# Patient Record
Sex: Male | Born: 1935 | Race: White | Hispanic: No | Marital: Married | Smoking: Current every day smoker
Health system: Southern US, Community
[De-identification: ages and names within clinical notes are randomized; demographics above are authoritative.]

## PROBLEM LIST (undated history)

## (undated) DIAGNOSIS — E785 Hyperlipidemia, unspecified: Secondary | ICD-10-CM

## (undated) DIAGNOSIS — I639 Cerebral infarction, unspecified: Secondary | ICD-10-CM

## (undated) DIAGNOSIS — I2510 Atherosclerotic heart disease of native coronary artery without angina pectoris: Secondary | ICD-10-CM

## (undated) DIAGNOSIS — C801 Malignant (primary) neoplasm, unspecified: Secondary | ICD-10-CM

## (undated) DIAGNOSIS — Z87442 Personal history of urinary calculi: Secondary | ICD-10-CM

## (undated) DIAGNOSIS — J449 Chronic obstructive pulmonary disease, unspecified: Secondary | ICD-10-CM

## (undated) DIAGNOSIS — I10 Essential (primary) hypertension: Secondary | ICD-10-CM

## (undated) DIAGNOSIS — I219 Acute myocardial infarction, unspecified: Secondary | ICD-10-CM

## (undated) DIAGNOSIS — I6521 Occlusion and stenosis of right carotid artery: Secondary | ICD-10-CM

## (undated) HISTORY — DX: Atherosclerotic heart disease of native coronary artery without angina pectoris: I25.10

## (undated) HISTORY — PX: CARDIAC CATHETERIZATION: SHX172

## (undated) HISTORY — DX: Hyperlipidemia, unspecified: E78.5

## (undated) HISTORY — PX: PROSTATE BIOPSY: SHX241

---

## 1999-04-12 ENCOUNTER — Other Ambulatory Visit: Admission: RE | Admit: 1999-04-12 | Discharge: 1999-04-12 | Payer: Self-pay | Admitting: Urology

## 1999-05-07 ENCOUNTER — Inpatient Hospital Stay (HOSPITAL_COMMUNITY): Admission: RE | Admit: 1999-05-07 | Discharge: 1999-05-12 | Payer: Self-pay | Admitting: Urology

## 1999-05-08 ENCOUNTER — Encounter: Payer: Self-pay | Admitting: Urology

## 1999-11-27 ENCOUNTER — Encounter: Payer: Self-pay | Admitting: Urology

## 1999-11-30 ENCOUNTER — Ambulatory Visit (HOSPITAL_COMMUNITY): Admission: RE | Admit: 1999-11-30 | Discharge: 1999-11-30 | Payer: Self-pay | Admitting: Urology

## 2004-07-05 ENCOUNTER — Ambulatory Visit: Payer: Self-pay | Admitting: Family Medicine

## 2004-08-01 ENCOUNTER — Ambulatory Visit: Payer: Self-pay | Admitting: Family Medicine

## 2004-08-21 ENCOUNTER — Ambulatory Visit: Payer: Self-pay | Admitting: Family Medicine

## 2004-12-17 ENCOUNTER — Ambulatory Visit: Payer: Self-pay | Admitting: Family Medicine

## 2005-03-20 ENCOUNTER — Ambulatory Visit: Payer: Self-pay | Admitting: Family Medicine

## 2005-05-27 ENCOUNTER — Ambulatory Visit: Payer: Self-pay | Admitting: Family Medicine

## 2005-09-10 ENCOUNTER — Ambulatory Visit: Payer: Self-pay | Admitting: Family Medicine

## 2005-09-24 ENCOUNTER — Ambulatory Visit: Payer: Self-pay | Admitting: Family Medicine

## 2005-10-28 ENCOUNTER — Ambulatory Visit: Payer: Self-pay | Admitting: Family Medicine

## 2006-03-13 ENCOUNTER — Ambulatory Visit: Payer: Self-pay | Admitting: Family Medicine

## 2006-05-21 ENCOUNTER — Ambulatory Visit: Payer: Self-pay | Admitting: Family Medicine

## 2006-06-06 ENCOUNTER — Ambulatory Visit: Payer: Self-pay | Admitting: Family Medicine

## 2006-09-18 ENCOUNTER — Ambulatory Visit: Payer: Self-pay | Admitting: Family Medicine

## 2006-11-24 ENCOUNTER — Ambulatory Visit: Payer: Self-pay | Admitting: Family Medicine

## 2007-01-19 ENCOUNTER — Ambulatory Visit: Payer: Self-pay | Admitting: Family Medicine

## 2007-01-29 ENCOUNTER — Ambulatory Visit (HOSPITAL_COMMUNITY): Admission: RE | Admit: 2007-01-29 | Discharge: 2007-01-29 | Payer: Self-pay | Admitting: Urology

## 2007-02-17 ENCOUNTER — Ambulatory Visit (HOSPITAL_COMMUNITY): Admission: RE | Admit: 2007-02-17 | Discharge: 2007-02-17 | Payer: Self-pay | Admitting: Internal Medicine

## 2007-02-25 ENCOUNTER — Ambulatory Visit (HOSPITAL_BASED_OUTPATIENT_CLINIC_OR_DEPARTMENT_OTHER): Admission: RE | Admit: 2007-02-25 | Discharge: 2007-02-25 | Payer: Self-pay | Admitting: Urology

## 2010-10-24 ENCOUNTER — Other Ambulatory Visit: Payer: Self-pay | Admitting: Ophthalmology

## 2010-10-24 ENCOUNTER — Encounter (HOSPITAL_COMMUNITY): Payer: Medicare Other | Attending: Ophthalmology

## 2010-10-24 DIAGNOSIS — Z01812 Encounter for preprocedural laboratory examination: Secondary | ICD-10-CM | POA: Insufficient documentation

## 2010-10-24 DIAGNOSIS — Z01810 Encounter for preprocedural cardiovascular examination: Secondary | ICD-10-CM | POA: Insufficient documentation

## 2010-10-24 LAB — BASIC METABOLIC PANEL
BUN: 18 mg/dL (ref 6–23)
Creatinine, Ser: 0.82 mg/dL (ref 0.4–1.5)
GFR calc Af Amer: >60 mL/min (ref >60)
GFR calc non Af Amer: >60 mL/min (ref >60)
Potassium: 4.3 mEq/L (ref 3.5–5.1)

## 2010-10-24 LAB — HEMOGLOBIN AND HEMATOCRIT, BLOOD
HCT: 47.7 % (ref 39.0–52.0)
Hemoglobin: 16.7 g/dL (ref 13.0–17.0)

## 2010-10-30 ENCOUNTER — Ambulatory Visit (HOSPITAL_COMMUNITY)
Admission: RE | Admit: 2010-10-30 | Discharge: 2010-10-30 | Disposition: A | Discharge status: Home / Self Care | Payer: Medicare Other | Source: Ambulatory Visit | Attending: Ophthalmology | Admitting: Ophthalmology

## 2010-10-30 DIAGNOSIS — Z7982 Long term (current) use of aspirin: Secondary | ICD-10-CM | POA: Insufficient documentation

## 2010-10-30 DIAGNOSIS — H2510 Age-related nuclear cataract, unspecified eye: Secondary | ICD-10-CM | POA: Insufficient documentation

## 2010-11-08 ENCOUNTER — Other Ambulatory Visit (HOSPITAL_COMMUNITY): Payer: Medicare Other

## 2010-11-13 ENCOUNTER — Ambulatory Visit (HOSPITAL_COMMUNITY)
Admission: RE | Admit: 2010-11-13 | Discharge: 2010-11-13 | Disposition: A | Discharge status: Home / Self Care | Payer: Medicare Other | Source: Ambulatory Visit | Attending: Ophthalmology | Admitting: Ophthalmology

## 2010-11-13 DIAGNOSIS — H2510 Age-related nuclear cataract, unspecified eye: Secondary | ICD-10-CM | POA: Insufficient documentation

## 2010-11-13 DIAGNOSIS — Z7982 Long term (current) use of aspirin: Secondary | ICD-10-CM | POA: Insufficient documentation

## 2010-12-18 NOTE — Op Note (Signed)
Daniel Simon, Daniel Simon              ACCOUNT NO.:  [id number]   MEDICAL RECORD NO.:  [id number]          PATIENT TYPE:  AMB   LOCATION:  [facility]                         FACILITY:  [facility]   PHYSICIAN:  Mark C. Vernie Ammons, M.D.  DATE OF BIRTH:  March 23, 1935   DATE OF PROCEDURE:  02/25/2007  DATE OF DISCHARGE:                               OPERATIVE REPORT   PREOPERATIVE DIAGNOSIS:  Left ureteral calculi.   POSTOPERATIVE DIAGNOSIS:  Left ureteral calculi.   OPERATION PERFORMED:  Cystoscopy, left retrograde pyelography, left  ureteroscopic stone manipulation and basket extraction, left ureteral  stent placement.   SURGEON:  Mark C. Vernie Ammons, M.D.   ASSISTANT:  Melina Schools, M.D.   ANESTHESIA:  General.   SPECIMENS:  Ureteral calculi.   DRAINS:  4.7 x 27 double-J ureteral stent with tether.   INDICATIONS FOR PROCEDURE:  Mr. Kunert is a 76 year old male well-healed  presented to referring institution with flank pain.  He was found to  have ureteral calculus.  Attempts at ureteroscopic manipulation were  unsuccessful.  The patient ended up with percutaneous nephrostomy and  antegrade stent.  He presents for definitive stone management.   DESCRIPTION OF PROCEDURE:  The patient was brought to the operating  room.  After informed consent was verified and preoperative time out  performed.  Had successful induction of general endotracheal anesthesia.  The patient was moved to dorsal lithotomy position.  All appropriate  pressure points were padded to avoid any pressure compartment syndrome.  Sequential compression devices were employed.  Perioperative antibiotics  were administered.  The perineum was prepped and draped.  Surgeon was  Designer, jewellery.  22 French cystoscopic sheath was used to introduce a  12 degree cystoscopic lens transurethrally into the bladder.  Transurethroscopy was performed.  There was a bladder neck contracture  that permitted passage of the 22 French sheath.  Dilated  ureteral  orifices were noted.  The left orifice was laterally located.  Distal  coil of double-J stent was seen to exit.  The remainder of the bladder  was free of any mucosal lesions, erythema, foreign bodies and tumors  Attention was turned to the stent.  It was grasped with flexible  grasping forceps and externalized.  The wire was advanced into the renal  pelvis.  The stent was removed.  Cystoscope was removed.  A 6 short  semirigid ureteroscope was driven into the ureter under direct vision.  Then in the distal ureter we identified two approximately 6 mm stones.  Nitinol basket was used to extract them and deposit them in the bladder.  The ureteroscope was removed.  The cystoscope was backloaded with the  wire.  Attempts at passing the stent were unsuccessful.  We removed the  cystoscope.  A 6 French end hole catheter was advanced into the  midureter.  Contrast was injected and a left retrograde pyelogram was  performed.  Left retrograde pyelogram revealed a tortuous left ureter.  There was mild hydronephrosis.   A Glide wire was then advanced into the renal pelvis under fluoroscopic  control.  A 4.7 x 24 double-J ureteral stent  was advanced over the wire  under fluoroscopic control.  While controlling the tether once the  proximal end was seen within the renal pelvis, the wire was removed.  Excellent proximal and distal curls were noted.  The cystoscope was  reinserted.  The bladder were drained and the stones were externalized.  At this time the procedure was terminated.  The patient tolerated the  procedure well.  There were no complications.   Ihor Gully MD the attending primary and responsible physician present  and participated in all aspects of the procedure.   DISPOSITION:  Patient extubated uneventfully and transported safely to  PACU.     ______________________________  Cristal Generous. Vernie Ammons, M.D.  Electronically Signed    JR/MEDQ  D:   02/25/2007  T:  02/26/2007  Job:  [id number]

## 2010-12-21 NOTE — Op Note (Signed)
[place]  Patient:    Daniel Simon, Daniel Simon                       MRN: [id number] Proc. Date: 11/30/99 Adm. Date:  [id number] Disc. Date: [id number] Attending:  Trisha Mangle                           Operative Report  PREOPERATIVE DIAGNOSIS:  Bladder calculus.  POSTOPERATIVE DIAGNOSIS:  Bladder calculus.  PROCEDURE:  Cystolithalopaxy.  SURGEON:  Mark C. Vernie Ammons, M.D.  ANESTHESIA:  General.  DRAINS:  None.  SPECIMENS:  Stone to pathology.  BLOOD LOSS:  Less than 5 cc.  COMPLICATIONS:  None.  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  Since that time, he has been plagued by persistent pyuria and recurrent UTIs.  He also has fairly significant irritative voiding symptoms and urinary incontinence.  He was found in my office cystoscopically to have a large bladder stone.  He is brought to the OR for its  removal.  The risks, complications, and limitations have been discussed.  DESCRIPTION OF PROCEDURE:  After informed consent, the patient was brought to the major OR, and after receiving 80 mg of tobramycin IV, and 400 mg of Cipro IV, he was administered general anesthesia, and then moved to the dorsal lithotomy position.  His genitalia was sterilely prepped and draped.  The urethra was then sounded with Sissy Hoff sounds which easily passed up to 28-French without difficulty into the bladder.  The 23-French cystoscope was then inserted into the bladder and the obturator removed and the 12 degree lens inserted.  The bladder was fully inspected and noted to have some irritation on the floor and a large yellow stone which was photographed.  There was a little bit of fibrinous exudate at the bladder neck hat was scraped off as well and then the cystoscope was removed.  I then inserted the a lithotrite and found that the stone was very soft.  I  crushed it up easily and extracted all the bits with the lithotrites and using the Ellik evacuator.  I then reinspected the bladder and noted the mucosa to be intact with no significant injury or bleeding, and then drained the bladder and removed the scope.  The patient was awakened and taken to the recovery room in stable satisfactory condition.  He tolerated the procedure well with no intraoperative complications. He will be continued on antibiotics postoperatively and follow up in my office n two weeks for recheck. DD:  11/30/99 TD:  12/01/99 Job: [id number]

## 2011-05-20 LAB — CBC
Platelets: 203
RBC: 4.72
WBC: 7.5

## 2011-05-20 LAB — POCT I-STAT 4, (NA,K, GLUC, HGB,HCT)
Glucose, Bld: 178 — ABNORMAL HIGH
HCT: 48
Hemoglobin: 16.3
Operator id: 114531
Potassium: 4.1
Sodium: 138

## 2015-03-14 ENCOUNTER — Other Ambulatory Visit (HOSPITAL_COMMUNITY): Payer: Self-pay | Admitting: General Surgery

## 2015-03-14 DIAGNOSIS — M79605 Pain in left leg: Principal | ICD-10-CM

## 2015-03-14 DIAGNOSIS — M79604 Pain in right leg: Secondary | ICD-10-CM

## 2015-03-14 DIAGNOSIS — Z8739 Personal history of other diseases of the musculoskeletal system and connective tissue: Secondary | ICD-10-CM

## 2015-03-23 ENCOUNTER — Ambulatory Visit (HOSPITAL_COMMUNITY)
Admission: RE | Admit: 2015-03-23 | Discharge: 2015-03-23 | Disposition: A | Discharge status: Home / Self Care | Payer: Medicare Other | Source: Ambulatory Visit | Attending: General Surgery | Admitting: General Surgery

## 2015-03-23 ENCOUNTER — Other Ambulatory Visit (HOSPITAL_COMMUNITY): Payer: Self-pay | Admitting: General Surgery

## 2015-03-23 DIAGNOSIS — M5442 Lumbago with sciatica, left side: Secondary | ICD-10-CM

## 2015-03-23 DIAGNOSIS — Z8739 Personal history of other diseases of the musculoskeletal system and connective tissue: Secondary | ICD-10-CM

## 2015-03-23 DIAGNOSIS — M79605 Pain in left leg: Principal | ICD-10-CM

## 2015-03-23 DIAGNOSIS — M79604 Pain in right leg: Secondary | ICD-10-CM

## 2015-03-23 DIAGNOSIS — M8588 Other specified disorders of bone density and structure, other site: Secondary | ICD-10-CM | POA: Insufficient documentation

## 2015-03-23 DIAGNOSIS — M545 Low back pain: Secondary | ICD-10-CM | POA: Insufficient documentation

## 2017-12-24 ENCOUNTER — Encounter (HOSPITAL_COMMUNITY): Payer: Self-pay | Admitting: Emergency Medicine

## 2017-12-24 ENCOUNTER — Other Ambulatory Visit: Payer: Self-pay

## 2017-12-24 ENCOUNTER — Emergency Department (HOSPITAL_COMMUNITY): Payer: Medicare Other

## 2017-12-24 ENCOUNTER — Inpatient Hospital Stay (HOSPITAL_COMMUNITY)
Admission: EM | Admit: 2017-12-24 | Discharge: 2017-12-26 | DRG: 064 | Disposition: A | Discharge status: Home / Self Care | Payer: Medicare Other | Attending: Internal Medicine | Admitting: Internal Medicine

## 2017-12-24 DIAGNOSIS — R200 Anesthesia of skin: Secondary | ICD-10-CM | POA: Diagnosis not present

## 2017-12-24 DIAGNOSIS — R29702 NIHSS score 2: Secondary | ICD-10-CM | POA: Diagnosis present

## 2017-12-24 DIAGNOSIS — Z87442 Personal history of urinary calculi: Secondary | ICD-10-CM

## 2017-12-24 DIAGNOSIS — I712 Thoracic aortic aneurysm, without rupture: Secondary | ICD-10-CM | POA: Diagnosis present

## 2017-12-24 DIAGNOSIS — I10 Essential (primary) hypertension: Secondary | ICD-10-CM | POA: Diagnosis not present

## 2017-12-24 DIAGNOSIS — I639 Cerebral infarction, unspecified: Principal | ICD-10-CM | POA: Diagnosis present

## 2017-12-24 DIAGNOSIS — Z720 Tobacco use: Secondary | ICD-10-CM | POA: Diagnosis present

## 2017-12-24 DIAGNOSIS — I7121 Aneurysm of the ascending aorta, without rupture: Secondary | ICD-10-CM | POA: Diagnosis present

## 2017-12-24 DIAGNOSIS — I110 Hypertensive heart disease with heart failure: Secondary | ICD-10-CM | POA: Diagnosis present

## 2017-12-24 DIAGNOSIS — J439 Emphysema, unspecified: Secondary | ICD-10-CM | POA: Diagnosis present

## 2017-12-24 DIAGNOSIS — I6523 Occlusion and stenosis of bilateral carotid arteries: Secondary | ICD-10-CM | POA: Diagnosis present

## 2017-12-24 DIAGNOSIS — I509 Heart failure, unspecified: Secondary | ICD-10-CM

## 2017-12-24 DIAGNOSIS — Z7982 Long term (current) use of aspirin: Secondary | ICD-10-CM

## 2017-12-24 DIAGNOSIS — I5021 Acute systolic (congestive) heart failure: Secondary | ICD-10-CM | POA: Diagnosis present

## 2017-12-24 DIAGNOSIS — Z8546 Personal history of malignant neoplasm of prostate: Secondary | ICD-10-CM

## 2017-12-24 DIAGNOSIS — F17210 Nicotine dependence, cigarettes, uncomplicated: Secondary | ICD-10-CM | POA: Diagnosis present

## 2017-12-24 DIAGNOSIS — R591 Generalized enlarged lymph nodes: Secondary | ICD-10-CM | POA: Diagnosis present

## 2017-12-24 HISTORY — DX: Malignant (primary) neoplasm, unspecified: C80.1

## 2017-12-24 LAB — DIFFERENTIAL
BASOS ABS: 0 10*3/uL (ref 0.0–0.1)
BASOS PCT: 0 %
EOS ABS: 0.2 10*3/uL (ref 0.0–0.7)
Eosinophils Relative: 3 %
Lymphocytes Relative: 29 %
Lymphs Abs: 1.7 10*3/uL (ref 0.7–4.0)
Monocytes Absolute: 0.3 10*3/uL (ref 0.1–1.0)
Monocytes Relative: 5 %
NEUTROS ABS: 3.8 10*3/uL (ref 1.7–7.7)
NEUTROS PCT: 63 %

## 2017-12-24 LAB — RAPID URINE DRUG SCREEN, HOSP PERFORMED
AMPHETAMINES: NOT DETECTED
BARBITURATES: NOT DETECTED
Benzodiazepines: NOT DETECTED
Cocaine: NOT DETECTED
Opiates: NOT DETECTED
TETRAHYDROCANNABINOL: NOT DETECTED

## 2017-12-24 LAB — URINALYSIS, ROUTINE W REFLEX MICROSCOPIC
BILIRUBIN URINE: NEGATIVE
GLUCOSE, UA: NEGATIVE mg/dL
Ketones, ur: NEGATIVE mg/dL
LEUKOCYTES UA: NEGATIVE
NITRITE: NEGATIVE
PH: 7 (ref 5.0–8.0)
Protein, ur: NEGATIVE mg/dL
SPECIFIC GRAVITY, URINE: 1.006 (ref 1.005–1.030)

## 2017-12-24 LAB — COMPREHENSIVE METABOLIC PANEL
ALBUMIN: 4.2 g/dL (ref 3.5–5.0)
ALT: 10 U/L — AB (ref 17–63)
AST: 16 U/L (ref 15–41)
Alkaline Phosphatase: 86 U/L (ref 38–126)
Anion gap: 9 (ref 5–15)
BUN: 16 mg/dL (ref 6–20)
CHLORIDE: 108 mmol/L (ref 101–111)
CO2: 25 mmol/L (ref 22–32)
CREATININE: 0.94 mg/dL (ref 0.61–1.24)
Calcium: 9.3 mg/dL (ref 8.9–10.3)
GFR calc Af Amer: >60 mL/min (ref >60)
GFR calc non Af Amer: >60 mL/min (ref >60)
Glucose, Bld: 93 mg/dL (ref 65–99)
POTASSIUM: 3.9 mmol/L (ref 3.5–5.1)
SODIUM: 142 mmol/L (ref 135–145)
Total Bilirubin: 0.9 mg/dL (ref 0.3–1.2)
Total Protein: 7.5 g/dL (ref 6.5–8.1)

## 2017-12-24 LAB — CBC
HCT: 49.3 % (ref 39.0–52.0)
Hemoglobin: 16.8 g/dL (ref 13.0–17.0)
MCH: 30.5 pg (ref 26.0–34.0)
MCHC: 34.1 g/dL (ref 30.0–36.0)
MCV: 89.6 fL (ref 78.0–100.0)
PLATELETS: 104 10*3/uL — AB (ref 150–400)
RBC: 5.5 MIL/uL (ref 4.22–5.81)
RDW: 14.6 % (ref 11.5–15.5)
WBC: 6.1 10*3/uL (ref 4.0–10.5)

## 2017-12-24 LAB — ETHANOL

## 2017-12-24 LAB — BRAIN NATRIURETIC PEPTIDE: B NATRIURETIC PEPTIDE 5: 509 pg/mL — AB (ref 0.0–100.0)

## 2017-12-24 LAB — APTT: APTT: 28 s (ref 24–36)

## 2017-12-24 LAB — TROPONIN I

## 2017-12-24 LAB — PROTIME-INR
INR: 1.06
PROTHROMBIN TIME: 13.8 s (ref 11.4–15.2)

## 2017-12-24 MED ORDER — NICOTINE 21 MG/24HR TD PT24
21.0000 mg | MEDICATED_PATCH | Freq: Every day | TRANSDERMAL | Status: DC
  Administered 2017-12-24 – 2017-12-26 (×3): 21 mg via TRANSDERMAL
  Filled 2017-12-24 (×3): qty 1

## 2017-12-24 MED ORDER — ACETAMINOPHEN 160 MG/5ML PO SOLN: 650.0000 mg | ORAL | Status: DC | PRN

## 2017-12-24 MED ORDER — SENNOSIDES-DOCUSATE SODIUM 8.6-50 MG PO TABS: 1.0000 | ORAL_TABLET | Freq: Every evening | ORAL | Status: DC | PRN

## 2017-12-24 MED ORDER — ASPIRIN 300 MG RE SUPP: 300.0000 mg | Freq: Every day | RECTAL | Status: DC

## 2017-12-24 MED ORDER — ACETAMINOPHEN 325 MG PO TABS: 650.0000 mg | ORAL_TABLET | ORAL | Status: DC | PRN

## 2017-12-24 MED ORDER — SODIUM CHLORIDE 0.9 % IV SOLN: INTRAVENOUS | Status: DC

## 2017-12-24 MED ORDER — FUROSEMIDE 10 MG/ML IJ SOLN
20.0000 mg | Freq: Two times a day (BID) | INTRAMUSCULAR | Status: DC
  Administered 2017-12-24 – 2017-12-26 (×4): 20 mg via INTRAVENOUS
  Filled 2017-12-24 (×4): qty 2

## 2017-12-24 MED ORDER — ENOXAPARIN SODIUM 40 MG/0.4ML ~~LOC~~ SOLN
40.0000 mg | SUBCUTANEOUS | Status: DC
  Administered 2017-12-24 – 2017-12-25 (×2): 40 mg via SUBCUTANEOUS
  Filled 2017-12-24 (×2): qty 0.4

## 2017-12-24 MED ORDER — ACETAMINOPHEN 650 MG RE SUPP: 650.0000 mg | RECTAL | Status: DC | PRN

## 2017-12-24 MED ORDER — IPRATROPIUM-ALBUTEROL 0.5-2.5 (3) MG/3ML IN SOLN
3.0000 mL | Freq: Three times a day (TID) | RESPIRATORY_TRACT | Status: DC
  Administered 2017-12-25 (×2): 3 mL via RESPIRATORY_TRACT
  Filled 2017-12-24 (×2): qty 3

## 2017-12-24 MED ORDER — IPRATROPIUM-ALBUTEROL 0.5-2.5 (3) MG/3ML IN SOLN
3.0000 mL | Freq: Four times a day (QID) | RESPIRATORY_TRACT | Status: DC
  Administered 2017-12-24: 3 mL via RESPIRATORY_TRACT

## 2017-12-24 MED ORDER — GUAIFENESIN ER 600 MG PO TB12
1200.0000 mg | ORAL_TABLET | Freq: Two times a day (BID) | ORAL | Status: DC
  Administered 2017-12-24 – 2017-12-26 (×4): 1200 mg via ORAL
  Filled 2017-12-24 (×4): qty 2

## 2017-12-24 MED ORDER — ASPIRIN 325 MG PO TABS
325.0000 mg | ORAL_TABLET | Freq: Every day | ORAL | Status: DC
  Administered 2017-12-24 – 2017-12-26 (×3): 325 mg via ORAL
  Filled 2017-12-24 (×3): qty 1

## 2017-12-24 MED ORDER — STROKE: EARLY STAGES OF RECOVERY BOOK
Freq: Once | Status: AC
  Administered 2017-12-24: 22:00:00
  Filled 2017-12-24: qty 1

## 2017-12-24 NOTE — ED Triage Notes (Signed)
Pt states started with severe headache last night around 9pm. Pt states left arm and leg numbness which began last night and when he woke up this am still the same.

## 2017-12-24 NOTE — ED Notes (Signed)
Patient transported to CT 

## 2017-12-24 NOTE — ED Notes (Signed)
Advised patient we needed urine specimen. 

## 2017-12-24 NOTE — ED Provider Notes (Signed)
[facility] EMERGENCY DEPARTMENT Provider Note   CSN: [id number] Arrival date & time: 12/24/17  5277     History   Chief Complaint Chief Complaint  Patient presents with  . Numbness    HPI Daniel Simon is a 83 y.o. male.  HPI Pt was seen at 1055. Per pt and his wife, c/o gradual onset and persistence of constant left sided "numbness" and "weakness" that began last night approximately 2000/2100. Pt's wife states pt c/o dull right sided headache that gradually worsened "until he took something and it got better." This was followed by LUE and LLE weakness and numbness. Pt's wife states she feels pt's speech is also slurred, as well as right face is "doesn't look right." Denies headache currently. Denies visual changes, no dysphagia, no CP/palpitations, no SOB/cough, no abd pain, no N/V/D, no falls, no syncope/near syncope.     Past Medical History:  Diagnosis Date  . Cancer [facility])    prostate  . Renal disorder    kidney stones    There are no active problems to display for this patient.         Home Medications    Prior to Admission medications   Medication Sig Start Date End Date Taking? Authorizing Provider  aspirin EC 81 MG tablet Take 81 mg by mouth daily.   Yes [provider]    Family History No family history on file.  Social History Social History   Tobacco Use  . Smoking status: Current Every Day Smoker    Packs/day: 1.00    Types: Cigarettes  . Smokeless tobacco: Never Used  Substance Use Topics  . Alcohol use: Never    Frequency: Never  . Drug use: Never     Allergies   Patient has no allergy information on record.   Review of Systems Review of Systems ROS: Statement: All systems negative except as marked or noted in the HPI; Constitutional: Negative for fever and chills. ; ; Eyes: Negative for eye pain, redness and discharge. ; ; ENMT: Negative for ear pain, hoarseness, nasal congestion, sinus pressure and sore throat. ; ;  Cardiovascular: Negative for chest pain, palpitations, diaphoresis, dyspnea and peripheral edema. ; ; Respiratory: Negative for cough, wheezing and stridor. ; ; Gastrointestinal: Negative for nausea, vomiting, diarrhea, abdominal pain, blood in stool, hematemesis, jaundice and rectal bleeding. . ; ; Genitourinary: Negative for dysuria, flank pain and hematuria. ; ; Musculoskeletal: Negative for back pain and neck pain. Negative for swelling and trauma.; ; Skin: Negative for pruritus, rash, abrasions, blisters, bruising and skin lesion.; ; Neuro: +headache, extremity weakness, paresthesias. Negative for headache, lightheadedness and neck stiffness. Negative for weakness, altered level of consciousness, altered mental status, involuntary movement, seizure and syncope.       Physical Exam Updated Vital Signs BP (!) 156/94 (BP Location: Right Arm)   Pulse 89   Temp 97.8 F (36.6 C) (Oral)   Resp (!) 22   Ht 5\' 7"  (1.702 m)   Wt 59 kg (130 lb)   SpO2 93%   BMI 20.36 kg/m   Physical Exam 1100: Physical examination:  Nursing notes reviewed; Vital signs and O2 SAT reviewed;  Constitutional: Well developed, Well nourished, Well hydrated, In no acute distress; Head:  Normocephalic, atraumatic; Eyes: EOMI, PERRL, No scleral icterus; ENMT: Mouth and pharynx normal, Mucous membranes moist; Neck: Supple, Full range of motion, No lymphadenopathy; Cardiovascular: Regular rate and rhythm, No gallop; Respiratory: Breath sounds clear & equal bilaterally, No wheezes.  Speaking  full sentences with ease, Normal respiratory effort/excursion; Chest: Nontender, Movement normal; Abdomen: Soft, Nontender, Nondistended, Normal bowel sounds; Genitourinary: No CVA tenderness; Extremities: Peripheral pulses normal, No tenderness, No edema, No calf edema or asymmetry.; Neuro: AA&Ox3, Major CN grossly intact. Speech slurred.  +right facial droop. Grips R>L. Strength 5/5 RUE and RLE; strength 4/5 LUE and LLE.  +subjective  decreased sensation LUE and LLE, otherwise no gross sensory deficits.  Normal cerebellar testing bilat UE's (finger-nose) and LE's (heel-shin).; Skin: Color normal, Warm, Dry.   ED Treatments / Results  Labs (all labs ordered are listed, but only abnormal results are displayed)   EKG EKG Interpretation  Date/Time:  Wednesday Dec 24 2017 09:49:43 EDT Ventricular Rate:  99 PR Interval:  156 QRS Duration: 96 QT Interval:  360 QTC Calculation: 462 R Axis:   -19 Text Interpretation:  Sinus rhythm with marked sinus arrhythmia with occasional Premature ventricular complexes Anterior infarct , age undetermined Nonspecific T wave abnormality Lateral leads When compared with ECG of 10/24/2010 Premature ventricular complexes is now Present Nonspecific T wave abnormality is now Present Confirmed by Francine Graven [phone]) on 12/24/2017 12:05:09 PM   Radiology   Procedures Procedures (including critical care time)  Medications Ordered in ED Medications - No data to display   Initial Impression / Assessment and Plan / ED Course  I have reviewed the triage vital signs and the nursing notes.  Pertinent labs & imaging results that were available during my care of the patient were reviewed by me and considered in my medical decision making (see chart for details).  MDM Reviewed: previous chart, nursing note and vitals Reviewed previous: labs and ECG Interpretation: labs, ECG, CT scan and MRI    Results for orders placed or performed during the hospital encounter of 12/24/17  Ethanol  Result Value Ref Range   Alcohol, Ethyl (B) <10 <10 mg/dL  Protime-INR  Result Value Ref Range   Prothrombin Time 13.8 11.4 - 15.2 seconds   INR 1.06   APTT  Result Value Ref Range   aPTT 28 24 - 36 seconds  CBC  Result Value Ref Range   WBC 6.1 4.0 - 10.5 K/uL   RBC 5.50 4.22 - 5.81 MIL/uL   Hemoglobin 16.8 13.0 - 17.0 g/dL   HCT 49.3 39.0 - 52.0 %   MCV 89.6 78.0 - 100.0 fL   MCH 30.5 26.0 -  34.0 pg   MCHC 34.1 30.0 - 36.0 g/dL   RDW 14.6 11.5 - 15.5 %   Platelets 104 (L) 150 - 400 K/uL  Differential  Result Value Ref Range   Neutrophils Relative % 63 %   Neutro Abs 3.8 1.7 - 7.7 K/uL   Lymphocytes Relative 29 %   Lymphs Abs 1.7 0.7 - 4.0 K/uL   Monocytes Relative 5 %   Monocytes Absolute 0.3 0.1 - 1.0 K/uL   Eosinophils Relative 3 %   Eosinophils Absolute 0.2 0.0 - 0.7 K/uL   Basophils Relative 0 %   Basophils Absolute 0.0 0.0 - 0.1 K/uL  Comprehensive metabolic panel  Result Value Ref Range   Sodium 142 135 - 145 mmol/L   Potassium 3.9 3.5 - 5.1 mmol/L   Chloride 108 101 - 111 mmol/L   CO2 25 22 - 32 mmol/L   Glucose, Bld 93 65 - 99 mg/dL   BUN 16 6 - 20 mg/dL   Creatinine, Ser 0.94 0.61 - 1.24 mg/dL   Calcium 9.3 8.9 - 10.3 mg/dL   Total  Protein 7.5 6.5 - 8.1 g/dL   Albumin 4.2 3.5 - 5.0 g/dL   AST 16 15 - 41 U/L   ALT 10 (L) 17 - 63 U/L   Alkaline Phosphatase 86 38 - 126 U/L   Total Bilirubin 0.9 0.3 - 1.2 mg/dL   GFR calc non Af Amer >60 >60 mL/min   GFR calc Af Amer >60 >60 mL/min   Anion gap 9 5 - 15  Troponin I  Result Value Ref Range   Troponin I <0.03 <0.03 ng/mL   Ct Head Wo Contrast Result Date: 12/24/2017 CLINICAL DATA:  Headache with left sided numbness. History of prostate carcinoma EXAM: CT HEAD WITHOUT CONTRAST TECHNIQUE: Contiguous axial images were obtained from the base of the skull through the vertex without intravenous contrast. COMPARISON:  June 10, 2011 FINDINGS: Brain: Mild diffuse atrophy is noted. There is no intracranial mass, hemorrhage, extra-axial fluid collection, or midline shift. There is evidence of a prior small infarct in the head of the caudate nucleus on the right. There is evidence of a prior small infarct in the anterior right lentiform nucleus. There is also a small likely prior lacunar infarct in the mid right thalamus. There is patchy small vessel disease in the centra semiovale bilaterally. No apparent acute infarct  evident. Vascular: No hyperdense vessel evident. There is calcification in each carotid siphon and distal vertebral artery. Skull: Bony calvarium appears intact. Sinuses/Orbits: There is mucosal thickening in several ethmoid air cells. Other visualized paranasal sinuses are clear. Orbits appear symmetric bilaterally. Other: Mastoid air cells are clear. IMPRESSION: Atrophy with periventricular small vessel disease. Prior small lacunar infarcts in the right basal ganglia and right thalamus regions. No acute infarct is demonstrable on this study. There are foci of arterial vascular calcification. There is mucosal thickening in several ethmoid air cells. Electronically Signed   By: Lowella Grip III M.D.   On: 12/24/2017 10:58   Mr Jodene Nam Head Wo Contrast Result Date: 12/24/2017 CLINICAL DATA:  Left arm and leg numbness. Headache. Altered mental status. EXAM: MRI HEAD WITHOUT CONTRAST MRA HEAD WITHOUT CONTRAST TECHNIQUE: Multiplanar, multiecho pulse sequences of the brain and surrounding structures were obtained without intravenous contrast. Angiographic images of the head were obtained using MRA technique without contrast. COMPARISON:  Head CT 12/24/2017 FINDINGS: MRI HEAD FINDINGS Multiple sequences are up to moderately motion degraded. Brain: There is a subcentimeter acute infarct in the posterior right pons. A 3 mm focus of trace diffusion hyperintensity in the right frontal lobe near the anterior aspect of the insula demonstrates normal ADC and may represent a subacute infarct. There is also a 1.6 cm focus of abnormal diffusion signal and T2 hyperintensity without reduced ADC in the left body of the corpus callosum with slight local mass effect. Chronic lacunar infarcts are noted in the right basal ganglia, right thalamus, and left cerebral hemispheric white matter. Patchy T2 hyperintensities elsewhere in the cerebral white matter and pons are nonspecific but compatible with mild chronic small vessel ischemic  disease. There is mild cerebral atrophy. Vascular: Partially abnormal distal left vertebral artery, more fully evaluated below. Other major intracranial vascular flow voids are preserved. Skull and upper cervical spine: Unremarkable bone marrow signal. Sinuses/Orbits: Bilateral cataract extraction. Paranasal sinuses and mastoid air cells are clear. Other: None. MRA HEAD FINDINGS The study is mildly motion degraded. The visualized distal right vertebral artery is widely patent. There is no flow related enhancement in the majority of the left V4 segment suggesting occlusion, possibly with minimal  retrograde flow distally. AICA and SCA origins are grossly patent bilaterally. The basilar artery is widely patent. The PCAs are patent without evidence of significant proximal stenosis. Posterior communicating arteries are not identified and may be absent or diminutive. The internal carotid arteries are patent from skull base to carotid termini. Focally diminished signal in the distal right petrous ICA may be artifactual. There is the suggestion of mild right and possibly left paraclinoid stenosis versus artifact from motion and vessel orientation. The left cavernous ICA is slightly ectatic in appearance. A 2.5-3 mm funnel shaped outpouching from the left supraclinoid ICA in the posterior communicating region may represent an infundibulum or aneurysm. An associated vessel arising from this outpouching is not resolved on this MRA, however motion artifact limits assessment. The right A1 segment is absent. The left A1 segment is widely patent. Both A2 segments are patent without evidence of significant stenosis. The MCAs are patent without evidence of proximal branch occlusion or M1 stenosis. Motion artifact limits assessment for MCA branch vessel stenoses. IMPRESSION: 1. Small acute right pontine infarct. 2. 1.6 cm focus of diffusion abnormality in the left body of the corpus callosum with mild mass effect favored to represent  a subacute infarct. A small neoplasm and demyelination are alternative considerations. Follow-up brain MRI without and with contrast is recommended in 6 weeks. 3. Possible punctate subacute right frontal infarct. 4. Mild chronic small vessel ischemic disease. 5. Motion degraded MRA without evidence of major branch occlusion or flow limiting proximal stenosis in the anterior circulation. Possible mild bilateral ICA stenoses. 6. Occluded distal left vertebral artery. Electronically Signed   By: Logan Bores M.D.   On: 12/24/2017 12:54   Mr Brain Wo Contrast (neuro Protocol) Result Date: 12/24/2017 CLINICAL DATA:  Left arm and leg numbness. Headache. Altered mental status. EXAM: MRI HEAD WITHOUT CONTRAST MRA HEAD WITHOUT CONTRAST TECHNIQUE: Multiplanar, multiecho pulse sequences of the brain and surrounding structures were obtained without intravenous contrast. Angiographic images of the head were obtained using MRA technique without contrast. COMPARISON:  Head CT 12/24/2017 FINDINGS: MRI HEAD FINDINGS Multiple sequences are up to moderately motion degraded. Brain: There is a subcentimeter acute infarct in the posterior right pons. A 3 mm focus of trace diffusion hyperintensity in the right frontal lobe near the anterior aspect of the insula demonstrates normal ADC and may represent a subacute infarct. There is also a 1.6 cm focus of abnormal diffusion signal and T2 hyperintensity without reduced ADC in the left body of the corpus callosum with slight local mass effect. Chronic lacunar infarcts are noted in the right basal ganglia, right thalamus, and left cerebral hemispheric white matter. Patchy T2 hyperintensities elsewhere in the cerebral white matter and pons are nonspecific but compatible with mild chronic small vessel ischemic disease. There is mild cerebral atrophy. Vascular: Partially abnormal distal left vertebral artery, more fully evaluated below. Other major intracranial vascular flow voids are  preserved. Skull and upper cervical spine: Unremarkable bone marrow signal. Sinuses/Orbits: Bilateral cataract extraction. Paranasal sinuses and mastoid air cells are clear. Other: None. MRA HEAD FINDINGS The study is mildly motion degraded. The visualized distal right vertebral artery is widely patent. There is no flow related enhancement in the majority of the left V4 segment suggesting occlusion, possibly with minimal retrograde flow distally. AICA and SCA origins are grossly patent bilaterally. The basilar artery is widely patent. The PCAs are patent without evidence of significant proximal stenosis. Posterior communicating arteries are not identified and may be absent or diminutive. The  internal carotid arteries are patent from skull base to carotid termini. Focally diminished signal in the distal right petrous ICA may be artifactual. There is the suggestion of mild right and possibly left paraclinoid stenosis versus artifact from motion and vessel orientation. The left cavernous ICA is slightly ectatic in appearance. A 2.5-3 mm funnel shaped outpouching from the left supraclinoid ICA in the posterior communicating region may represent an infundibulum or aneurysm. An associated vessel arising from this outpouching is not resolved on this MRA, however motion artifact limits assessment. The right A1 segment is absent. The left A1 segment is widely patent. Both A2 segments are patent without evidence of significant stenosis. The MCAs are patent without evidence of proximal branch occlusion or M1 stenosis. Motion artifact limits assessment for MCA branch vessel stenoses. IMPRESSION: 1. Small acute right pontine infarct. 2. 1.6 cm focus of diffusion abnormality in the left body of the corpus callosum with mild mass effect favored to represent a subacute infarct. A small neoplasm and demyelination are alternative considerations. Follow-up brain MRI without and with contrast is recommended in 6 weeks. 3. Possible  punctate subacute right frontal infarct. 4. Mild chronic small vessel ischemic disease. 5. Motion degraded MRA without evidence of major branch occlusion or flow limiting proximal stenosis in the anterior circulation. Possible mild bilateral ICA stenoses. 6. Occluded distal left vertebral artery. Electronically Signed   By: Logan Bores M.D.   On: 12/24/2017 12:54    1340:  MRI with several acute/subacute strokes. Pt agitated regarding admission, states he's "OK, I haven't had a stroke."  I again informed pt regarding multiple strokes seen on MRI; pt's wife also reiterated my conversation to him.   Dx and testing d/w pt and family.  Questions answered.  Verb understanding, agreeable to admit.  T/C returned from [facility] Roderic Palau, case discussed, including:  HPI, pertinent PM/SHx, VS/PE, dx testing, ED course and treatment:  Agreeable to admit.      Final Clinical Impressions(s) / ED Diagnoses   Final diagnoses:  None    ED Discharge Orders    None       Francine Graven, DO 12/26/17 1532

## 2017-12-24 NOTE — ED Notes (Signed)
Patient back from CT.

## 2017-12-24 NOTE — H&P (Signed)
History and Physical    CATON POPOWSKI PPJ:[id number] DOB: 12-Jan-1935 DOA: 12/24/2017  PCP: Dione Housekeeper, MD  Patient coming from: Home  I have personally briefly reviewed patient's old medical records in [place]  Chief Complaint: Left-sided numbness  HPI: Daniel Simon is a 83 y.o. male with medical history significant of prostate cancer, renal stones, has not seen his primary care physician in quite some time.  Patient reports that yesterday evening he developed acute onset of headache.  He took an Aleve with some improvement of his symptoms.  He also had left-sided upper extremity and lower extremity numbness.  Denies any changes in vision.  Denies any dysarthria or slurring of speech.  He reports that he had no trouble walking although his wife does say that she noticed him having difficulty with ambulation.  He was also feeling somewhat lightheaded, but this has improved.  He has a chronic cough.  His family has noticed that he is been more short of breath on exertion for the past several weeks.  Denies any chest pain.  No fever.  No vomiting or diarrhea.  ED Course: On arrival to the emergency room is noted to be hypertensive.  EKG does not show any acute changes.  MRI brain confirms acute stroke.  Chest x-ray indicates evidence of CHF.  He is being admitted for further treatments.  Review of Systems: As per HPI otherwise 10 point review of systems negative.    Past Medical History:  Diagnosis Date  . Cancer [facility])    prostate  . Renal disorder    kidney stones    Past Surgical History:  Procedure Laterality Date  . PROSTATE BIOPSY       reports that he has been smoking cigarettes.  He has been smoking about 1.00 pack per day. He has never used smokeless tobacco. He reports that he drank alcohol. He reports that he does not use drugs.  Not on File  Family history: Family history reviewed and not pertinent  Prior to Admission medications   Medication Sig  Start Date End Date Taking? Authorizing Provider  aspirin EC 81 MG tablet Take 81 mg by mouth daily.   Yes [provider]    Physical Exam: Vitals:   12/24/17 1555 12/24/17 1630 12/24/17 1725 12/24/17 1804  BP:  (!) 153/107 (!) 158/115 (!) 175/90  Pulse:   94 (!) 101  Resp: (!) 21 (!) 24 17 20   Temp:    98 F (36.7 C)  TempSrc:    Oral  SpO2:   95% 94%  Weight:    64.7 kg (142 lb 10.2 oz)  Height:    5\' 6"  (1.676 m)    Constitutional: NAD, calm, comfortable Vitals:   12/24/17 1555 12/24/17 1630 12/24/17 1725 12/24/17 1804  BP:  (!) 153/107 (!) 158/115 (!) 175/90  Pulse:   94 (!) 101  Resp: (!) 21 (!) 24 17 20   Temp:    98 F (36.7 C)  TempSrc:    Oral  SpO2:   95% 94%  Weight:    64.7 kg (142 lb 10.2 oz)  Height:    5\' 6"  (1.676 m)   Eyes: PERRL, lids and conjunctivae normal ENMT: Mucous membranes are moist. Posterior pharynx clear of any exudate or lesions.Normal dentition.  Neck: normal, supple, no masses, no thyromegaly Respiratory: Bilateral rhonchi with mild wheeze.  Normal respiratory effort. No accessory muscle use.  Cardiovascular: Regular rate and rhythm, no murmurs / rubs /  gallops.  1+ extremity edema. 2+ pedal pulses. No carotid bruits.  Abdomen: no tenderness, no masses palpated. No hepatosplenomegaly. Bowel sounds positive.  Musculoskeletal: no clubbing / cyanosis. No joint deformity upper and lower extremities. Good ROM, no contractures. Normal muscle tone.  Skin: no rashes, lesions, ulcers. No induration Neurologic: CN 2-12 grossly intact. Sensation intact, DTR normal. Strength 5/5 in all 4.  Psychiatric: Normal judgment and insight. Alert and oriented x 3. Normal mood.    Labs on Admission: I have personally reviewed following labs and imaging studies  CBC: Recent Labs  Lab 12/24/17 1025  WBC 6.1  NEUTROABS 3.8  HGB 16.8  HCT 49.3  MCV 89.6  PLT 329*   Basic Metabolic Panel: Recent Labs  Lab 12/24/17 1025  NA 142  K 3.9  CL 108    CO2 25  GLUCOSE 93  BUN 16  CREATININE 0.94  CALCIUM 9.3   GFR: Estimated Creatinine Clearance: 55.6 mL/min (by C-G formula based on SCr of 0.94 mg/dL). Liver Function Tests: Recent Labs  Lab 12/24/17 1025  AST 16  ALT 10*  ALKPHOS 86  BILITOT 0.9  PROT 7.5  ALBUMIN 4.2   No results for input(s): LIPASE, AMYLASE in the last 168 hours. No results for input(s): AMMONIA in the last 168 hours. Coagulation Profile: Recent Labs  Lab 12/24/17 1025  INR 1.06   Cardiac Enzymes: Recent Labs  Lab 12/24/17 1025  TROPONINI <0.03   BNP (last 3 results) No results for input(s): PROBNP in the last 8760 hours. HbA1C: No results for input(s): HGBA1C in the last 72 hours. CBG: No results for input(s): GLUCAP in the last 168 hours. Lipid Profile: No results for input(s): CHOL, HDL, LDLCALC, TRIG, CHOLHDL, LDLDIRECT in the last 72 hours. Thyroid Function Tests: No results for input(s): TSH, T4TOTAL, FREET4, T3FREE, THYROIDAB in the last 72 hours. Anemia Panel: No results for input(s): VITAMINB12, FOLATE, FERRITIN, TIBC, IRON, RETICCTPCT in the last 72 hours. Urine analysis:    Component Value Date/Time   COLORURINE STRAW (A) 12/24/2017 1025   APPEARANCEUR CLEAR 12/24/2017 1025   LABSPEC 1.006 12/24/2017 1025   PHURINE 7.0 12/24/2017 1025   GLUCOSEU NEGATIVE 12/24/2017 1025   HGBUR SMALL (A) 12/24/2017 1025   BILIRUBINUR NEGATIVE 12/24/2017 1025   KETONESUR NEGATIVE 12/24/2017 1025   PROTEINUR NEGATIVE 12/24/2017 1025   NITRITE NEGATIVE 12/24/2017 1025   LEUKOCYTESUR NEGATIVE 12/24/2017 1025    Radiological Exams on Admission: Ct Head Wo Contrast  Result Date: 12/24/2017 CLINICAL DATA:  Headache with left sided numbness. History of prostate carcinoma EXAM: CT HEAD WITHOUT CONTRAST TECHNIQUE: Contiguous axial images were obtained from the base of the skull through the vertex without intravenous contrast. COMPARISON:  June 10, 2011 FINDINGS: Brain: Mild diffuse atrophy is  noted. There is no intracranial mass, hemorrhage, extra-axial fluid collection, or midline shift. There is evidence of a prior small infarct in the head of the caudate nucleus on the right. There is evidence of a prior small infarct in the anterior right lentiform nucleus. There is also a small likely prior lacunar infarct in the mid right thalamus. There is patchy small vessel disease in the centra semiovale bilaterally. No apparent acute infarct evident. Vascular: No hyperdense vessel evident. There is calcification in each carotid siphon and distal vertebral artery. Skull: Bony calvarium appears intact. Sinuses/Orbits: There is mucosal thickening in several ethmoid air cells. Other visualized paranasal sinuses are clear. Orbits appear symmetric bilaterally. Other: Mastoid air cells are clear. IMPRESSION: Atrophy with periventricular  small vessel disease. Prior small lacunar infarcts in the right basal ganglia and right thalamus regions. No acute infarct is demonstrable on this study. There are foci of arterial vascular calcification. There is mucosal thickening in several ethmoid air cells. Electronically Signed   By: Lowella Grip III M.D.   On: 12/24/2017 10:58   Mr Jodene Nam Head Wo Contrast  Result Date: 12/24/2017 CLINICAL DATA:  Left arm and leg numbness. Headache. Altered mental status. EXAM: MRI HEAD WITHOUT CONTRAST MRA HEAD WITHOUT CONTRAST TECHNIQUE: Multiplanar, multiecho pulse sequences of the brain and surrounding structures were obtained without intravenous contrast. Angiographic images of the head were obtained using MRA technique without contrast. COMPARISON:  Head CT 12/24/2017 FINDINGS: MRI HEAD FINDINGS Multiple sequences are up to moderately motion degraded. Brain: There is a subcentimeter acute infarct in the posterior right pons. A 3 mm focus of trace diffusion hyperintensity in the right frontal lobe near the anterior aspect of the insula demonstrates normal ADC and may represent a  subacute infarct. There is also a 1.6 cm focus of abnormal diffusion signal and T2 hyperintensity without reduced ADC in the left body of the corpus callosum with slight local mass effect. Chronic lacunar infarcts are noted in the right basal ganglia, right thalamus, and left cerebral hemispheric white matter. Patchy T2 hyperintensities elsewhere in the cerebral white matter and pons are nonspecific but compatible with mild chronic small vessel ischemic disease. There is mild cerebral atrophy. Vascular: Partially abnormal distal left vertebral artery, more fully evaluated below. Other major intracranial vascular flow voids are preserved. Skull and upper cervical spine: Unremarkable bone marrow signal. Sinuses/Orbits: Bilateral cataract extraction. Paranasal sinuses and mastoid air cells are clear. Other: None. MRA HEAD FINDINGS The study is mildly motion degraded. The visualized distal right vertebral artery is widely patent. There is no flow related enhancement in the majority of the left V4 segment suggesting occlusion, possibly with minimal retrograde flow distally. AICA and SCA origins are grossly patent bilaterally. The basilar artery is widely patent. The PCAs are patent without evidence of significant proximal stenosis. Posterior communicating arteries are not identified and may be absent or diminutive. The internal carotid arteries are patent from skull base to carotid termini. Focally diminished signal in the distal right petrous ICA may be artifactual. There is the suggestion of mild right and possibly left paraclinoid stenosis versus artifact from motion and vessel orientation. The left cavernous ICA is slightly ectatic in appearance. A 2.5-3 mm funnel shaped outpouching from the left supraclinoid ICA in the posterior communicating region may represent an infundibulum or aneurysm. An associated vessel arising from this outpouching is not resolved on this MRA, however motion artifact limits assessment. The  right A1 segment is absent. The left A1 segment is widely patent. Both A2 segments are patent without evidence of significant stenosis. The MCAs are patent without evidence of proximal branch occlusion or M1 stenosis. Motion artifact limits assessment for MCA branch vessel stenoses. IMPRESSION: 1. Small acute right pontine infarct. 2. 1.6 cm focus of diffusion abnormality in the left body of the corpus callosum with mild mass effect favored to represent a subacute infarct. A small neoplasm and demyelination are alternative considerations. Follow-up brain MRI without and with contrast is recommended in 6 weeks. 3. Possible punctate subacute right frontal infarct. 4. Mild chronic small vessel ischemic disease. 5. Motion degraded MRA without evidence of major branch occlusion or flow limiting proximal stenosis in the anterior circulation. Possible mild bilateral ICA stenoses. 6. Occluded distal left vertebral  artery. Electronically Signed   By: Logan Bores M.D.   On: 12/24/2017 12:54   Mr Brain Wo Contrast (neuro Protocol)  Result Date: 12/24/2017 CLINICAL DATA:  Left arm and leg numbness. Headache. Altered mental status. EXAM: MRI HEAD WITHOUT CONTRAST MRA HEAD WITHOUT CONTRAST TECHNIQUE: Multiplanar, multiecho pulse sequences of the brain and surrounding structures were obtained without intravenous contrast. Angiographic images of the head were obtained using MRA technique without contrast. COMPARISON:  Head CT 12/24/2017 FINDINGS: MRI HEAD FINDINGS Multiple sequences are up to moderately motion degraded. Brain: There is a subcentimeter acute infarct in the posterior right pons. A 3 mm focus of trace diffusion hyperintensity in the right frontal lobe near the anterior aspect of the insula demonstrates normal ADC and may represent a subacute infarct. There is also a 1.6 cm focus of abnormal diffusion signal and T2 hyperintensity without reduced ADC in the left body of the corpus callosum with slight local mass  effect. Chronic lacunar infarcts are noted in the right basal ganglia, right thalamus, and left cerebral hemispheric white matter. Patchy T2 hyperintensities elsewhere in the cerebral white matter and pons are nonspecific but compatible with mild chronic small vessel ischemic disease. There is mild cerebral atrophy. Vascular: Partially abnormal distal left vertebral artery, more fully evaluated below. Other major intracranial vascular flow voids are preserved. Skull and upper cervical spine: Unremarkable bone marrow signal. Sinuses/Orbits: Bilateral cataract extraction. Paranasal sinuses and mastoid air cells are clear. Other: None. MRA HEAD FINDINGS The study is mildly motion degraded. The visualized distal right vertebral artery is widely patent. There is no flow related enhancement in the majority of the left V4 segment suggesting occlusion, possibly with minimal retrograde flow distally. AICA and SCA origins are grossly patent bilaterally. The basilar artery is widely patent. The PCAs are patent without evidence of significant proximal stenosis. Posterior communicating arteries are not identified and may be absent or diminutive. The internal carotid arteries are patent from skull base to carotid termini. Focally diminished signal in the distal right petrous ICA may be artifactual. There is the suggestion of mild right and possibly left paraclinoid stenosis versus artifact from motion and vessel orientation. The left cavernous ICA is slightly ectatic in appearance. A 2.5-3 mm funnel shaped outpouching from the left supraclinoid ICA in the posterior communicating region may represent an infundibulum or aneurysm. An associated vessel arising from this outpouching is not resolved on this MRA, however motion artifact limits assessment. The right A1 segment is absent. The left A1 segment is widely patent. Both A2 segments are patent without evidence of significant stenosis. The MCAs are patent without evidence of  proximal branch occlusion or M1 stenosis. Motion artifact limits assessment for MCA branch vessel stenoses. IMPRESSION: 1. Small acute right pontine infarct. 2. 1.6 cm focus of diffusion abnormality in the left body of the corpus callosum with mild mass effect favored to represent a subacute infarct. A small neoplasm and demyelination are alternative considerations. Follow-up brain MRI without and with contrast is recommended in 6 weeks. 3. Possible punctate subacute right frontal infarct. 4. Mild chronic small vessel ischemic disease. 5. Motion degraded MRA without evidence of major branch occlusion or flow limiting proximal stenosis in the anterior circulation. Possible mild bilateral ICA stenoses. 6. Occluded distal left vertebral artery. Electronically Signed   By: Logan Bores M.D.   On: 12/24/2017 12:54   Dg Chest Port 1 View  Result Date: 12/24/2017 CLINICAL DATA:  Cough EXAM: PORTABLE CHEST 1 VIEW COMPARISON:  02/25/2007 FINDINGS:  Cardiac shadow is stable. Vascular congestion and mild edematous changes are seen bilaterally. No focal confluent infiltrate or sizable effusion is noted. IMPRESSION: Changes of CHF.  No other acute abnormality noted. Electronically Signed   By: Inez Catalina M.D.   On: 12/24/2017 13:56    EKG: Independently reviewed.  Sinus rhythm without acute changes  Assessment/Plan Active Problems:   Stroke (cerebrum) (HCC)   Tobacco abuse   Left sided numbness   HTN (hypertension)   Acute CHF (congestive heart failure) ([place])     1. Acute stroke with left-sided numbness.  Patient says he is chronically on aspirin.  Will change to full dose aspirin.  Check echocardiogram and carotid Dopplers.  Check lipid panel.  Physical therapy evaluation. 2. Acute CHF.  BNP elevated at 500.  Chest x-ray shows evidence of CHF.  Will check echocardiogram.  Start on low-dose Lasix.  Hold off on beta-blockers and ACE inhibitors to allow permissive hypertension in the setting of  1. 3. Hypertension.  Allow permissive hypertension.  Can likely start beta-blocker/ACE inhibitor in the next 24 hours. 4. Tobacco use.  Counseled on the importance of tobacco cessation.  Provide nicotine patch.  DVT prophylaxis: lovenox  Code Status: full code  Family Communication: discussed with wife and daughter at the bedside  Disposition Plan: discharge home once improved  Consults called: neurology  Admission status: observation, tele   Kathie Dike MD [facility] Pager [phone]  If 7PM-7AM, please contact night-coverage [email or web address] Password [facility]  12/24/2017, 7:14 PM

## 2017-12-24 NOTE — ED Notes (Signed)
EKG given to [facility] MD

## 2017-12-25 ENCOUNTER — Observation Stay (HOSPITAL_BASED_OUTPATIENT_CLINIC_OR_DEPARTMENT_OTHER): Payer: Medicare Other

## 2017-12-25 ENCOUNTER — Observation Stay (HOSPITAL_COMMUNITY): Payer: Medicare Other

## 2017-12-25 DIAGNOSIS — I361 Nonrheumatic tricuspid (valve) insufficiency: Secondary | ICD-10-CM

## 2017-12-25 DIAGNOSIS — R591 Generalized enlarged lymph nodes: Secondary | ICD-10-CM | POA: Diagnosis present

## 2017-12-25 DIAGNOSIS — Z720 Tobacco use: Secondary | ICD-10-CM | POA: Diagnosis not present

## 2017-12-25 DIAGNOSIS — Z87442 Personal history of urinary calculi: Secondary | ICD-10-CM | POA: Diagnosis not present

## 2017-12-25 DIAGNOSIS — R200 Anesthesia of skin: Secondary | ICD-10-CM | POA: Diagnosis present

## 2017-12-25 DIAGNOSIS — R29702 NIHSS score 2: Secondary | ICD-10-CM | POA: Diagnosis present

## 2017-12-25 DIAGNOSIS — I110 Hypertensive heart disease with heart failure: Secondary | ICD-10-CM | POA: Diagnosis present

## 2017-12-25 DIAGNOSIS — I5021 Acute systolic (congestive) heart failure: Secondary | ICD-10-CM

## 2017-12-25 DIAGNOSIS — I6523 Occlusion and stenosis of bilateral carotid arteries: Secondary | ICD-10-CM | POA: Diagnosis present

## 2017-12-25 DIAGNOSIS — I712 Thoracic aortic aneurysm, without rupture: Secondary | ICD-10-CM | POA: Diagnosis present

## 2017-12-25 DIAGNOSIS — I10 Essential (primary) hypertension: Secondary | ICD-10-CM | POA: Diagnosis not present

## 2017-12-25 DIAGNOSIS — Z8546 Personal history of malignant neoplasm of prostate: Secondary | ICD-10-CM | POA: Diagnosis not present

## 2017-12-25 DIAGNOSIS — F17210 Nicotine dependence, cigarettes, uncomplicated: Secondary | ICD-10-CM | POA: Diagnosis present

## 2017-12-25 DIAGNOSIS — Z7982 Long term (current) use of aspirin: Secondary | ICD-10-CM | POA: Diagnosis not present

## 2017-12-25 DIAGNOSIS — J439 Emphysema, unspecified: Secondary | ICD-10-CM | POA: Diagnosis present

## 2017-12-25 DIAGNOSIS — I639 Cerebral infarction, unspecified: Secondary | ICD-10-CM | POA: Diagnosis present

## 2017-12-25 LAB — BASIC METABOLIC PANEL
Anion gap: 9 (ref 5–15)
BUN: 18 mg/dL (ref 6–20)
CHLORIDE: 106 mmol/L (ref 101–111)
CO2: 27 mmol/L (ref 22–32)
CREATININE: 0.85 mg/dL (ref 0.61–1.24)
Calcium: 8.9 mg/dL (ref 8.9–10.3)
GFR calc non Af Amer: >60 mL/min (ref >60)
GLUCOSE: 95 mg/dL (ref 65–99)
Potassium: 3.6 mmol/L (ref 3.5–5.1)
Sodium: 142 mmol/L (ref 135–145)

## 2017-12-25 LAB — LIPID PANEL
CHOL/HDL RATIO: 7.7 ratio
CHOLESTEROL: 192 mg/dL (ref 0–200)
HDL: 25 mg/dL — AB (ref >40)
LDL Cholesterol: 139 mg/dL — ABNORMAL HIGH (ref 0–99)
TRIGLYCERIDES: 139 mg/dL (ref <150)
VLDL: 28 mg/dL (ref 0–40)

## 2017-12-25 LAB — ECHOCARDIOGRAM COMPLETE
HEIGHTINCHES: 66 in
Weight: 2321 oz

## 2017-12-25 LAB — HEMOGLOBIN A1C
Hgb A1c MFr Bld: 4.8 % (ref 4.8–5.6)
MEAN PLASMA GLUCOSE: 91.06 mg/dL

## 2017-12-25 MED ORDER — IPRATROPIUM-ALBUTEROL 0.5-2.5 (3) MG/3ML IN SOLN: 3.0000 mL | RESPIRATORY_TRACT | Status: DC | PRN

## 2017-12-25 MED ORDER — CARVEDILOL 3.125 MG PO TABS
3.1250 mg | ORAL_TABLET | Freq: Two times a day (BID) | ORAL | Status: DC
  Administered 2017-12-26: 3.125 mg via ORAL
  Filled 2017-12-25 (×2): qty 1

## 2017-12-25 NOTE — Evaluation (Signed)
Physical Therapy Evaluation Patient Details Name: Daniel Simon MRN: [id number] DOB: 1935/03/02 Today's Date: 12/25/2017   History of Present Illness  Daniel Simon is a 83 y.o. male with medical history significant of prostate cancer, renal stones, has not seen his primary care physician in quite some time.  Patient reports that yesterday evening he developed acute onset of headache.  He took an Aleve with some improvement of his symptoms.  He also had left-sided upper extremity and lower extremity numbness.  Denies any changes in vision.  Denies any dysarthria or slurring of speech.  He reports that he had no trouble walking although his wife does say that she noticed him having difficulty with ambulation.  He was also feeling somewhat lightheaded, but this has improved. MRI positive for acute infarct.     Clinical Impression  Patient functioning near baseline for functional mobility and gait, except tends to drift to the right side during gait training without using an AD, able to concentrate better when using quad-cane for ambulation without drifting, had 1 episode of near LOB when distracted.  Patient tolerated sitting up in chair after there apy with spouse in room. Patient will benefit from continued physical therapy in hospital and recommended venue below to increase strength, balance, endurance for safe ADLs and gait.    Follow Up Recommendations Home health PT;Supervision for mobility/OOB    Equipment Recommendations  Cane    Recommendations for Other Services       Precautions / Restrictions Precautions Precautions: Fall Restrictions Weight Bearing Restrictions: No      Mobility  Bed Mobility Overal bed mobility: Modified Independent                Transfers Overall transfer level: Needs assistance Equipment used: None Transfers: Sit to/from Stand;Stand Pivot Transfers Sit to Stand: Supervision Stand pivot transfers: Supervision       General transfer  comment: slightly labored movement  Ambulation/Gait Ambulation/Gait assistance: Min guard Ambulation Distance (Feet): 150 Feet Assistive device: None;Quad cane Gait Pattern/deviations: Decreased step length - right;Decreased step length - left;Decreased stride length;Drifts right/left Gait velocity: decreased   General Gait Details: frequent drifting to the right when not using AD, improvement for walking in a straight line using quad-cane, near loss of balance when distracted   Stairs Stairs: Yes Stairs assistance: Modified independent (Device/Increase time) Stair Management: One rail Left;Alternating pattern;With cane;Step to pattern Number of Stairs: 9 General stair comments: demonstrates fair/good return for going up/down stairs using 1 siderail and quad cane without loss of balance, requires repeated VC's to bring cane last when going up stairs with fair carryover  Wheelchair Mobility    Modified Rankin (Stroke Patients Only)       Balance Overall balance assessment: Needs assistance Sitting-balance support: No upper extremity supported;Feet unsupported Sitting balance-Leahy Scale: Good     Standing balance support: No upper extremity supported;During functional activity Standing balance-Leahy Scale: Fair                               Pertinent Vitals/Pain Pain Assessment: No/denies pain    Home Living Family/patient expects to be discharged to:: Private residence Living Arrangements: Spouse/significant other Available Help at Discharge: Family;Available 24 hours/day Type of Home: House Home Access: Stairs to enter Entrance Stairs-Rails: Right;Left(can't reach both, to wide) Entrance Stairs-Number of Steps: 2 Home Layout: One level Home Equipment: None      Prior Function Level of Independence: Independent  Comments: community ambulator, drives     Hand Dominance   Dominant Hand: Right    Extremity/Trunk Assessment   Upper  Extremity Assessment Upper Extremity Assessment: Defer to OT evaluation    Lower Extremity Assessment Lower Extremity Assessment: Overall WFL for tasks assessed       Communication   Communication: No difficulties  Cognition Arousal/Alertness: Awake/alert Behavior During Therapy: WFL for tasks assessed/performed Overall Cognitive Status: Within Functional Limits for tasks assessed                                        General Comments      Exercises     Assessment/Plan    PT Assessment Patient needs continued PT services  PT Problem List Decreased balance;Decreased mobility;Decreased activity tolerance       PT Treatment Interventions Gait training;Stair training;Functional mobility training;Therapeutic activities;Therapeutic exercise;Patient/family education    PT Goals (Current goals can be found in the Care Plan section)  Acute Rehab PT Goals Patient Stated Goal: return home with spouse to assist PT Goal Formulation: With patient/family Time For Goal Achievement: 12/27/17 Potential to Achieve Goals: Good    Frequency 7X/week   Barriers to discharge        Co-evaluation               AM-PAC PT "6 Clicks" Daily Activity  Outcome Measure Difficulty turning over in bed (including adjusting bedclothes, sheets and blankets)?: None Difficulty moving from lying on back to sitting on the side of the bed? : None Difficulty sitting down on and standing up from a chair with arms (e.g., wheelchair, bedside commode, etc,.)?: None Help needed moving to and from a bed to chair (including a wheelchair)?: None Help needed walking in hospital room?: A Little Help needed climbing 3-5 steps with a railing? : A Little 6 Click Score: 22    End of Session   Activity Tolerance: Patient tolerated treatment well;Patient limited by fatigue Patient left: in chair;with call bell/phone within reach;with family/visitor present Nurse Communication: Mobility  status PT Visit Diagnosis: Unsteadiness on feet (R26.81);Other abnormalities of gait and mobility (R26.89);Muscle weakness (generalized) (M62.81)    Time: 6734-1937 PT Time Calculation (min) (ACUTE ONLY): 30 min   Charges:   PT Evaluation $PT Eval Moderate Complexity: 1 Mod PT Treatments $Gait Training: 23-37 mins   PT G Codes:        2:16 PM, 18-Jan-2018 Lonell Grandchild, MPT Physical Therapist with [facility] [phone] office [phone] mobile phone

## 2017-12-25 NOTE — Care Management Obs Status (Signed)
[place] NOTIFICATION   Patient Details  Name: Daniel Simon MRN: [id number] Date of Birth: [birth year]   Medicare Observation Status Notification Given:  Yes    Sherald Barge, RN 12/25/2017, 11:30 AM

## 2017-12-25 NOTE — Evaluation (Signed)
Occupational Therapy Evaluation Patient Details Name: Daniel Simon MRN: [id number] DOB: 07/15/1935 Today's Date: 12/25/2017    History of Present Illness MURLE OTTING is a 83 y.o. male with medical history significant of prostate cancer, renal stones, has not seen his primary care physician in quite some time.  Patient reports that yesterday evening he developed acute onset of headache.  He took an Aleve with some improvement of his symptoms.  He also had left-sided upper extremity and lower extremity numbness.  Denies any changes in vision.  Denies any dysarthria or slurring of speech.  He reports that he had no trouble walking although his wife does say that she noticed him having difficulty with ambulation.  He was also feeling somewhat lightheaded, but this has improved. MRI positive for acute infarct.    Clinical Impression   Pt received supine in bed, agreeable to OT evaluation. Pt reporting independence PTA with all B/IADL tasks, is incontinent at baseline. Pt performing all ADLs at mod I/supervision level during evaluation. Initial symptoms have resolved with exception of tingling in left foot. BUE strength is WFL, coordination and BUE sensation is intact. No further OT services needed at this time.     Follow Up Recommendations  No OT follow up    Equipment Recommendations  None recommended by OT       Precautions / Restrictions Precautions Precautions: None Restrictions Weight Bearing Restrictions: No      Mobility Bed Mobility Overal bed mobility: Modified Independent                Transfers Overall transfer level: Needs assistance Equipment used: None Transfers: Sit to/from Stand;Stand Pivot Transfers Sit to Stand: Min guard Stand pivot transfers: Supervision                ADL either performed or assessed with clinical judgement   ADL Overall ADL's : Needs assistance/impaired     Grooming: Wash/dry hands;Brushing  hair;Supervision/safety;Standing               Lower Body Dressing: Modified independent;Sitting/lateral leans   Toilet Transfer: Supervision/safety;Regular Museum/gallery exhibitions officer and Hygiene: Supervision/safety;Sit to/from stand       Functional mobility during ADLs: Supervision/safety       Vision Baseline Vision/History: No visual deficits Patient Visual Report: No change from baseline Vision Assessment?: No apparent visual deficits            Pertinent Vitals/Pain Pain Assessment: No/denies pain     Hand Dominance Right   Extremity/Trunk Assessment Upper Extremity Assessment Upper Extremity Assessment: Overall WFL for tasks assessed(RUE 5/5, LUE 4+/5)   Lower Extremity Assessment Lower Extremity Assessment: Defer to PT evaluation   Cervical / Trunk Assessment Cervical / Trunk Assessment: Normal   Communication Communication Communication: No difficulties   Cognition Arousal/Alertness: Awake/alert Behavior During Therapy: WFL for tasks assessed/performed Overall Cognitive Status: Within Functional Limits for tasks assessed                                                Home Living Family/patient expects to be discharged to:: Private residence Living Arrangements: Spouse/significant other Available Help at Discharge: Family;Available 24 hours/day Type of Home: House Home Access: Stairs to enter(back of house) Entrance Stairs-Number of Steps: 2 Entrance Stairs-Rails: Right;Left;Can reach both Home Layout: One level     Bathroom Shower/Tub: Tub/shower  unit;Walk-in shower(pt uses walk-in)   Bathroom Toilet: Standard     Home Equipment: None          Prior Functioning/Environment Level of Independence: Independent        Comments: drives        OT Problem List: Impaired sensation       AM-PAC PT "6 Clicks" Daily Activity     Outcome Measure Help from another person eating meals?: None Help  from another person taking care of personal grooming?: None Help from another person toileting, which includes using toliet, bedpan, or urinal?: None Help from another person bathing (including washing, rinsing, drying)?: None Help from another person to put on and taking off regular upper body clothing?: None Help from another person to put on and taking off regular lower body clothing?: None 6 Click Score: 24   End of Session Equipment Utilized During Treatment: Gait belt Nurse Communication: Mobility status  Activity Tolerance: Patient tolerated treatment well Patient left: in chair;with call bell/phone within reach;Other (comment)(RT in room)  OT Visit Diagnosis: Muscle weakness (generalized) (M62.81)                Time: 8546-2703 OT Time Calculation (min): 17 min Charges:  OT General Charges $OT Visit: 1 Visit OT Evaluation $OT Eval Low Complexity: Rosman, OTR/L  [phone] 12/25/2017, 7:51 AM

## 2017-12-25 NOTE — Progress Notes (Signed)
  Echocardiogram 2D Echocardiogram has been performed.  Nefertiti Mohamad L Androw 12/25/2017, 2:05 PM

## 2017-12-25 NOTE — Plan of Care (Signed)
  Problem: Acute Rehab PT Goals(only PT should resolve) Goal: Pt Will Go Supine/Side To Sit Outcome: Progressing Flowsheets (Taken 12/25/2017 1419) Pt will go Supine/Side to Sit: Independently Goal: Patient Will Transfer Sit To/From Stand Outcome: Progressing Flowsheets (Taken 12/25/2017 1419) Patient will transfer sit to/from stand: Independently Goal: Pt Will Transfer Bed To Chair/Chair To Bed Outcome: Progressing Flowsheets (Taken 12/25/2017 1419) Pt will Transfer Bed to Chair/Chair to Bed: Independently Goal: Pt Will Ambulate Outcome: Progressing Flowsheets (Taken 12/25/2017 1419) Pt will Ambulate: > 125 feet;with cane;with modified independence   2:22 PM, 12/25/17 Lonell Grandchild, MPT Physical Therapist with [facility] [phone] office [phone] mobile phone

## 2017-12-25 NOTE — Consult Note (Signed)
[place] A. Merlene Laughter, MD     [email or web address]          Daniel Simon is an 83 y.o. male.   ASSESSMENT/PLAN: 1. 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 The patient does have a left frontal infarct along with a high-grade stenosis involving the left ICA. It is unclear if this infarct is due to the stenosis or suspected embolic phenomena. I recommend that the patient be placed on double antiplatelet agents. He can have the left carotid stenosis operated on while his event monitor is being done to check for atrial fibrillation. Vascular surgery consultation is recommended.  Patient 83 year old right-handed white male who presents with a two-week history of episodic numbness involving the left leg and left upper extremity. The patient reports that it has been fluctuating over last 2 weeks. He reports having some associated dizziness. He denies any weakness however. He denies any associated dysarthria, dysphagia, alteration of sense area more right-sided symptoms. He reports having associated headache involving the right periorbital region. He does not report having palpitations or chest pain. He does have chronic ongoing dyspnea which she thinks is due to long-standing history of nicotine use. He is on aspirin 81 mg which he has been compliant with but takes no other medications. The review systems otherwise negative.      GENERAL: The patient is sitting in a chair and seemed to be doing well.  HEENT: This is normal.  ABDOMEN: soft  EXTREMITIES: No edema   BACK: This is normal.  SKIN: Normal by inspection.    MENTAL STATUS: Alert and oriented - including name, place, age and month and year. Speech- slight dysarthric although there reports this is his baseline, language and cognition are generally intact. Judgment and insight normal.   CRANIAL NERVES: Pupils are equal,  round and reactive to light and accomodation; extra ocular movements are full, there is no significant nystagmus; visual fields are full; upper and lower facial muscles are normal in strength and symmetric, there is no flattening of the nasolabial folds; tongue is midline; uvula is midline; shoulder elevation is normal.  MOTOR: Normal tone, bulk and strength; no pronator drift. There is no drift of the upper lower extremities.  COORDINATION: Left finger to nose is normal, right finger to nose is normal, No rest tremor; no intention tremor; no postural tremor; no bradykinesia.  REFLEXES: Deep tendon reflexes are symmetrical and normal. Plantar reflexes are flexor bilaterally.   SENSATION: Normal to light touch, temperature, and pain. No visual extinction but seemed to have extinction involving the left upper extremity at times.     NIHSS 2.        The brain MRI and MRA are reviewed in person. There is increased signal seen in 3 places involving the right midbrain region and the right frontal region. There is a larger lesion however involving the left frontal area extending to the genu  of these corpus callosum. There is moderate deep white matter and periventricular leukoencephalopathy. There is a small encephalomalacia involving the right basal ganglia consistent with a remote infarct. 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   Blood pressure (!) 149/84, pulse 83, temperature 98.1 F (36.7 C), temperature source Oral, resp. rate 14, height '5\' 6"'  (1.676 m), weight 145 lb 1 oz (65.8 kg), SpO2 95 %.  Past Medical History:  Diagnosis Date  . Cancer [facility]    prostate  .  Renal disorder    kidney stones    Past Surgical History:  Procedure Laterality Date  . PROSTATE BIOPSY      History reviewed. No pertinent family history.  Social History:  reports that he has been smoking cigarettes.  He has been smoking about 1.00 pack per day. He has never used smokeless  tobacco. He reports that he drank alcohol. He reports that he does not use drugs.  Allergies: No Known Allergies  Medications: Prior to Admission medications   Medication Sig Start Date End Date Taking? Authorizing Provider  aspirin EC 81 MG tablet Take 81 mg by mouth daily.   Yes [provider]    Scheduled Meds: . aspirin  300 mg Rectal Daily   Or  . aspirin  325 mg Oral Daily  . enoxaparin (LOVENOX) injection  40 mg Subcutaneous Q24H  . furosemide  20 mg Intravenous BID  . guaiFENesin  1,200 mg Oral BID  . ipratropium-albuterol  3 mL Nebulization TID  . nicotine  21 mg Transdermal Daily   Continuous Infusions: . sodium chloride     PRN Meds:.acetaminophen **OR** acetaminophen (TYLENOL) oral liquid 160 mg/5 mL **OR** acetaminophen, senna-docusate     Results for orders placed or performed during the hospital encounter of 12/24/17 (from the past 48 hour(s))  Ethanol     Status: None   Collection Time: 12/24/17 10:25 AM  Result Value Ref Range   Alcohol, Ethyl (B) <10 <10 mg/dL    Comment: Performed at [facility], [address], [place], [place] [address]  Protime-INR     Status: None   Collection Time: 12/24/17 10:25 AM  Result Value Ref Range   Prothrombin Time 13.8 11.4 - 15.2 seconds   INR 1.06     Comment: Performed at [facility], [address] [place], [place] [address]  APTT     Status: None   Collection Time: 12/24/17 10:25 AM  Result Value Ref Range   aPTT 28 24 - 36 seconds    Comment: Performed at [facility], [address] [place], [place] [address]  CBC     Status: Abnormal   Collection Time: 12/24/17 10:25 AM  Result Value Ref Range   WBC 6.1 4.0 - 10.5 K/uL   RBC 5.50 4.22 - 5.81 MIL/uL   Hemoglobin 16.8 13.0 - 17.0 g/dL   HCT 49.3 39.0 - 52.0 %   MCV 89.6 78.0 - 100.0 fL   MCH 30.5 26.0 - 34.0 pg   MCHC 34.1 30.0 - 36.0 g/dL   RDW 14.6 11.5 - 15.5 %   Platelets 104 (L) 150 - 400 K/uL    Comment: PLATELET COUNT CONFIRMED BY  SMEAR SPECIMEN CHECKED FOR CLOTS Performed at [facility], [address], [place], [place] [address]   Differential     Status: None   Collection Time: 12/24/17 10:25 AM  Result Value Ref Range   Neutrophils Relative % 63 %   Neutro Abs 3.8 1.7 - 7.7 K/uL   Lymphocytes Relative 29 %   Lymphs Abs 1.7 0.7 - 4.0 K/uL   Monocytes Relative 5 %   Monocytes Absolute 0.3 0.1 - 1.0 K/uL   Eosinophils Relative 3 %   Eosinophils Absolute 0.2 0.0 - 0.7 K/uL   Basophils Relative 0 %   Basophils Absolute 0.0 0.0 - 0.1 K/uL    Comment: Performed at [facility], [address], [place], [place] [address]  Comprehensive metabolic panel     Status: Abnormal  Collection Time: 12/24/17 10:25 AM  Result Value Ref Range   Sodium 142 135 - 145 mmol/L   Potassium 3.9 3.5 - 5.1 mmol/L   Chloride 108 101 - 111 mmol/L   CO2 25 22 - 32 mmol/L   Glucose, Bld 93 65 - 99 mg/dL   BUN 16 6 - 20 mg/dL   Creatinine, Ser 0.94 0.61 - 1.24 mg/dL   Calcium 9.3 8.9 - 10.3 mg/dL   Total Protein 7.5 6.5 - 8.1 g/dL   Albumin 4.2 3.5 - 5.0 g/dL   AST 16 15 - 41 U/L   ALT 10 (L) 17 - 63 U/L   Alkaline Phosphatase 86 38 - 126 U/L   Total Bilirubin 0.9 0.3 - 1.2 mg/dL   GFR calc non Af Amer >60 >60 mL/min   GFR calc Af Amer >60 >60 mL/min    Comment: (NOTE) The eGFR has been calculated using the CKD EPI equation. This calculation has not been validated in all clinical situations. eGFR's persistently <60 mL/min signify possible Chronic Kidney Disease.    Anion gap 9 5 - 15    Comment: Performed at [facility], [address], [place], [place] [address]  Urine rapid drug screen (hosp performed)not at [facility]     Status: None   Collection Time: 12/24/17 10:25 AM  Result Value Ref Range   Opiates NONE DETECTED NONE DETECTED   Cocaine NONE DETECTED NONE DETECTED   Benzodiazepines NONE DETECTED NONE DETECTED   Amphetamines NONE DETECTED NONE DETECTED   Tetrahydrocannabinol NONE DETECTED NONE DETECTED   Barbiturates  NONE DETECTED NONE DETECTED    Comment: (NOTE) DRUG SCREEN FOR MEDICAL PURPOSES ONLY.  IF CONFIRMATION IS NEEDED FOR ANY PURPOSE, NOTIFY LAB WITHIN 5 DAYS. LOWEST DETECTABLE LIMITS FOR URINE DRUG SCREEN Drug Class                     Cutoff (ng/mL) Amphetamine and metabolites    1000 Barbiturate and metabolites    200 Benzodiazepine                 923 Tricyclics and metabolites     300 Opiates and metabolites        300 Cocaine and metabolites        300 THC                            50 Performed at [facility], [address], [place], [place] [address]   Urinalysis, Routine w reflex microscopic     Status: Abnormal   Collection Time: 12/24/17 10:25 AM  Result Value Ref Range   Color, Urine STRAW (A) YELLOW   APPearance CLEAR CLEAR   Specific Gravity, Urine 1.006 1.005 - 1.030   pH 7.0 5.0 - 8.0   Glucose, UA NEGATIVE NEGATIVE mg/dL   Hgb urine dipstick SMALL (A) NEGATIVE   Bilirubin Urine NEGATIVE NEGATIVE   Ketones, ur NEGATIVE NEGATIVE mg/dL   Protein, ur NEGATIVE NEGATIVE mg/dL   Nitrite NEGATIVE NEGATIVE   Leukocytes, UA NEGATIVE NEGATIVE   RBC / HPF 0-5 0 - 5 RBC/hpf   WBC, UA 6-10 0 - 5 WBC/hpf   Bacteria, UA RARE (A) NONE SEEN   Squamous Epithelial / LPF 0-5 0 - 5    Comment: Performed at [facility], [address] [place],  [address]  Troponin I     Status: None   Collection Time: 12/24/17 10:25 AM  Result Value Ref Range   Troponin I <0.03 <0.03 ng/mL    Comment: Performed at [facility], [address] [place], [place] [address]  Brain natriuretic peptide     Status: Abnormal   Collection Time: 12/24/17  2:00 PM  Result Value Ref Range   B Natriuretic Peptide 509.0 (H) 0.0 - 100.0 pg/mL    Comment: Performed at [facility], [address], [place], [place] [address]  Hemoglobin A1c     Status: None   Collection Time: 12/25/17  5:48 AM  Result Value Ref Range   Hgb A1c MFr Bld 4.8 4.8 - 5.6 %    Comment: (NOTE) Pre diabetes:           5.7%-6.4% Diabetes:              >6.4% Glycemic control for   <7.0% adults with diabetes    Mean Plasma Glucose 91.06 mg/dL    Comment: Performed at [facility] [address] [place], [place] [address]  Lipid panel     Status: Abnormal   Collection Time: 12/25/17  5:48 AM  Result Value Ref Range   Cholesterol 192 0 - 200 mg/dL   Triglycerides 139 <150 mg/dL   HDL 25 (L) >40 mg/dL   Total CHOL/HDL Ratio 7.7 RATIO   VLDL 28 0 - 40 mg/dL   LDL Cholesterol 139 (H) 0 - 99 mg/dL    Comment:        Total Cholesterol/HDL:CHD Risk Coronary Heart Disease Risk Table                     Men   Women  1/2 Average Risk   3.4   3.3  Average Risk       5.0   4.4  2 X Average Risk   9.6   7.1  3 X Average Risk  23.4   11.0        Use the calculated Patient Ratio above and the CHD Risk Table to determine the patient's CHD Risk.        ATP III CLASSIFICATION (LDL):  <100     mg/dL   Optimal  100-129  mg/dL   Near or Above                    Optimal  130-159  mg/dL   Borderline  160-189  mg/dL   High  >190     mg/dL   Very High Performed at [facility], [address], [place], [place] [address]   Basic metabolic panel     Status: None   Collection Time: 12/25/17  5:48 AM  Result Value Ref Range   Sodium 142 135 - 145 mmol/L   Potassium 3.6 3.5 - 5.1 mmol/L   Chloride 106 101 - 111 mmol/L   CO2 27 22 - 32 mmol/L   Glucose, Bld 95 65 - 99 mg/dL   BUN 18 6 - 20 mg/dL   Creatinine, Ser 0.85 0.61 - 1.24 mg/dL   Calcium 8.9 8.9 - 10.3 mg/dL   GFR calc non Af Amer >60 >60 mL/min   GFR calc Af Amer >60 >60 mL/min    Comment: (NOTE) The eGFR has been calculated using the CKD EPI equation. This calculation has not been validated in all clinical situations. eGFR's persistently <60 mL/min signify possible Chronic Kidney Disease.    Anion gap 9 5 - 15    Comment: Performed at [facility], [address], [place], [place] [address]    Studies/Results:  CAROTID  DOPPLERS  FINDINGS: Criteria: Quantification of carotid stenosis is based on velocity parameters that correlate the residual internal carotid diameter with NASCET-based stenosis levels, using the diameter of the distal internal carotid lumen as the denominator for stenosis measurement.  The following velocity measurements were obtained:  RIGHT ICA: 169 cm/sec CCA: 56 cm/sec  SYSTOLIC ICA/CCA RATIO:  3  ECA:  198 cm/sec  LEFT  ICA: 330 cm/sec  CCA: 64 cm/sec  SYSTOLIC ICA/CCA RATIO:  3.9  ECA:  229 cm/sec  RIGHT CAROTID ARTERY: Heterogeneous plaque at the right carotid bulb and proximal internal carotid artery. External carotid artery is patent. Narrowing in the proximal internal carotid artery with a peak systolic velocity of 456 centimeters/second. Mid and distal internal carotid artery are patent.  RIGHT VERTEBRAL ARTERY: Antegrade flow and normal waveform in the right vertebral artery.  LEFT CAROTID ARTERY: Small amount of echogenic plaque at the left carotid bulb. Mildly elevated peak systolic velocity in the external carotid artery. Plaque in proximal internal carotid artery. Markedly elevated peak systolic velocity in the proximal internal carotid artery measuring 330 centimeters/second. Elevated peak systolic velocity in the mid internal carotid artery measuring 255 centimeters/second. Distal internal carotid artery is patent.  LEFT VERTEBRAL ARTERY: Not visualized. Reported to be occluded based on the recent MRA.  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  Estimated degree of stenosis in left internal carotid artery is greater than 70%.  Estimated degree of stenosis in the right internal carotid artery is 50-69%.  Patent right vertebral artery. Left vertebral artery not visualized and reported to be occluded based on recent MRA.     ECHO - Left ventricle: Distal septal  / apical hypokinesis. The cavity   size was mildly dilated. Wall thickness was increased in a   pattern of mild LVH. Systolic function was mildly reduced. The   estimated ejection fraction was in the range of 45% to 50%.   Doppler parameters are consistent with abnormal left ventricular   relaxation (grade 1 diastolic dysfunction). - Aortic valve: There was mild regurgitation. Valve area (VTI):   1.61 cm^2. Valve area (Vmax): 1.67 cm^2. Valve area (Vmean): 1.66   cm^2. - Mitral valve: There was mild regurgitation. - Left atrium: The atrium was moderately dilated. - Atrial septum: No defect or patent foramen ovale was identified. - Pulmonary arteries: PA peak pressure: 35 mm Hg (S).       BRAIN MRI MRA FINDINGS: MRI HEAD FINDINGS  Multiple sequences are up to moderately motion degraded.  Brain: There is a subcentimeter acute infarct in the posterior right pons. A 3 mm focus of trace diffusion hyperintensity in the right frontal lobe near the anterior aspect of the insula demonstrates normal ADC and may represent a subacute infarct. There is also a 1.6 cm focus of abnormal diffusion signal and T2 hyperintensity without reduced ADC in the left body of the corpus callosum with slight local mass effect. Chronic lacunar infarcts are noted in the right basal ganglia, right thalamus, and left cerebral hemispheric white matter. Patchy T2 hyperintensities elsewhere in the cerebral white matter and pons are nonspecific but compatible with mild chronic small vessel ischemic disease. There is mild cerebral atrophy.  Vascular: Partially abnormal distal left vertebral artery, more fully evaluated below. Other major intracranial vascular flow voids are preserved.  Skull and upper cervical spine: Unremarkable bone marrow signal.  Sinuses/Orbits: Bilateral  cataract extraction. Paranasal sinuses and mastoid air cells are clear.  Other: None.  MRA HEAD FINDINGS  The study is  mildly motion degraded.  The visualized distal right vertebral artery is widely patent. There is no flow related enhancement in the majority of the left V4 segment suggesting occlusion, possibly with minimal retrograde flow distally. AICA and SCA origins are grossly patent bilaterally. The basilar artery is widely patent. The PCAs are patent without evidence of significant proximal stenosis. Posterior communicating arteries are not identified and may be absent or diminutive.  The internal carotid arteries are patent from skull base to carotid termini. Focally diminished signal in the distal right petrous ICA may be artifactual. There is the suggestion of mild right and possibly left paraclinoid stenosis versus artifact from motion and vessel orientation. The left cavernous ICA is slightly ectatic in appearance. A 2.5-3 mm funnel shaped outpouching from the left supraclinoid ICA in the posterior communicating region may represent an infundibulum or aneurysm. An associated vessel arising from this outpouching is not resolved on this MRA, however motion artifact limits assessment.  The right A1 segment is absent. The left A1 segment is widely patent. Both A2 segments are patent without evidence of significant stenosis. The MCAs are patent without evidence of proximal branch occlusion or M1 stenosis. Motion artifact limits assessment for MCA branch vessel stenoses.  IMPRESSION: 1. Small acute right pontine infarct. 2. 1.6 cm focus of diffusion abnormality in the left body of the corpus callosum with mild mass effect favored to represent a subacute infarct. A small neoplasm and demyelination are alternative considerations. Follow-up brain MRI without and with contrast is recommended in 6 weeks. 3. Possible punctate subacute right frontal infarct. 4. Mild chronic small vessel ischemic disease. 5. Motion degraded MRA without evidence of major branch occlusion or flow limiting  proximal stenosis in the anterior circulation. Possible mild bilateral ICA stenoses. 6. Occluded distal left vertebral artery.                   Jaret [place]ppedge A. Merlene Laughter, M.D.  Diplomate, Tax adviser of Psychiatry and Neurology ( Neurology). 12/25/2017, 5:31 PM

## 2017-12-25 NOTE — Progress Notes (Signed)
PROGRESS NOTE    Daniel Simon  WUJ:[id number] DOB: 1934-11-24 DOA: 12/24/2017 PCP: Dione Housekeeper, MD    Brief Narrative:  83 year old male presented to the hospital with left-sided numbness.  MRI brain confirmed bilateral infarcts.  Further work-up indicated bilateral carotid stenosis.  Seen by neurology with recommendations for dual antiplatelet therapy.  Also noted to be short of breath with elevated BNP and evidence of volume overload.  Echocardiogram showed ejection fraction 45 to 50%.  Improving with IV Lasix.  Anticipate discharge in the next 24 hours after discussion with vascular surgery.   Assessment & Plan:   Active Problems:   Stroke (cerebrum) (HCC)   Tobacco abuse   Left sided numbness   HTN (hypertension)   Acute CHF (congestive heart failure) ([place])   1. Acute CVA.  Patient found to have bilateral infarcts on MRI imaging suggestive of embolic phenomena.  Seen by neurology who recommends 30-day event monitor.  He is been started on statin for elevated LDL.  Per neurology, recommendation for dual antiplatelet therapy he has significant carotid stenosis bilaterally which will be discussed with vascular surgery. 2. Acute systolic congestive heart failure.  Ejection fraction 45 to 50%.  Started on intravenous Lasix with improvement of volume status.  Shortness of breath is better and lower extremity edema has improved.  Anticipate Lasix to be transitioned to p.o. tomorrow.  Start on low-dose beta-blocker. 3. Tobacco abuse.  Counseled on importance of tobacco cessation.  Continue nicotine patch. 4. Hypertension.  Blood pressure currently stable.   DVT prophylaxis: Lovenox Code Status: Full code Family Communication: Discussed with multiple family members at the bedside Disposition Plan: Discharge home once work-up is complete   Consultants:   Neurology  Procedures:  Echo: - Left ventricle: Distal septal / apical hypokinesis. The cavity   size was mildly dilated.  Wall thickness was increased in a   pattern of mild LVH. Systolic function was mildly reduced. The   estimated ejection fraction was in the range of 45% to 50%.   Doppler parameters are consistent with abnormal left ventricular   relaxation (grade 1 diastolic dysfunction). - Aortic valve: There was mild regurgitation. Valve area (VTI):   1.61 cm^2. Valve area (Vmax): 1.67 cm^2. Valve area (Vmean): 1.66   cm^2. - Mitral valve: There was mild regurgitation. - Left atrium: The atrium was moderately dilated. - Atrial septum: No defect or patent foramen ovale was identified.  - Pulmonary arteries: PA peak pressure: 35 mm Hg (S).  Antimicrobials:      Subjective: Feeling better today.  Shortness of breath improving.  Lower extremity edema is better.  Objective: Vitals:   12/25/17 0746 12/25/17 1404 12/25/17 1423 12/25/17 1801  BP:   (!) 149/84 138/77  Pulse:   83 92  Resp:   14 16  Temp:   98.1 F (36.7 C) (!) 97.4 F (36.3 C)  TempSrc:   Oral Oral  SpO2: 93% (!) 88% 95% 94%  Weight:      Height:       No intake or output data in the 24 hours ending 12/25/17 1844 Filed Weights   12/24/17 0946 12/24/17 1804 12/25/17 0500  Weight: 59 kg (130 lb) 64.7 kg (142 lb 10.2 oz) 65.8 kg (145 lb 1 oz)    Examination:  General exam: Appears calm and comfortable  Respiratory system: Mild crackles at bases. Respiratory effort normal. Cardiovascular system: S1 & S2 heard, RRR. No JVD, murmurs, rubs, gallops or clicks. trace pedal edema. Gastrointestinal system:  Abdomen is nondistended, soft and nontender. No organomegaly or masses felt. Normal bowel sounds heard. Central nervous system: Alert and oriented. No focal neurological deficits. Extremities: Symmetric 5 x 5 power. Skin: No rashes, lesions or ulcers Psychiatry: Judgement and insight appear normal. Mood & affect appropriate.    Data Reviewed: I have personally reviewed following labs and imaging studies  CBC: Recent Labs    Lab 01/02/18 1025  WBC 6.1  NEUTROABS 3.8  HGB 16.8  HCT 49.3  MCV 89.6  PLT 379*   Basic Metabolic Panel: Recent Labs  Lab 2018-01-02 1025 12/25/17 0548  NA 142 142  K 3.9 3.6  CL 108 106  CO2 25 27  GLUCOSE 93 95  BUN 16 18  CREATININE 0.94 0.85  CALCIUM 9.3 8.9   GFR: Estimated Creatinine Clearance: 61.5 mL/min (by C-G formula based on SCr of 0.85 mg/dL). Liver Function Tests: Recent Labs  Lab 01/02/18 1025  AST 16  ALT 10*  ALKPHOS 86  BILITOT 0.9  PROT 7.5  ALBUMIN 4.2   No results for input(s): LIPASE, AMYLASE in the last 168 hours. No results for input(s): AMMONIA in the last 168 hours. Coagulation Profile: Recent Labs  Lab 2018/01/02 1025  INR 1.06   Cardiac Enzymes: Recent Labs  Lab 2018/01/02 1025  TROPONINI <0.03   BNP (last 3 results) No results for input(s): PROBNP in the last 8760 hours. HbA1C: Recent Labs    12/25/17 0548  HGBA1C 4.8   CBG: No results for input(s): GLUCAP in the last 168 hours. Lipid Profile: Recent Labs    12/25/17 0548  CHOL 192  HDL 25*  LDLCALC 139*  TRIG 139  CHOLHDL 7.7   Thyroid Function Tests: No results for input(s): TSH, T4TOTAL, FREET4, T3FREE, THYROIDAB in the last 72 hours. Anemia Panel: No results for input(s): VITAMINB12, FOLATE, FERRITIN, TIBC, IRON, RETICCTPCT in the last 72 hours. Sepsis Labs: No results for input(s): PROCALCITON, LATICACIDVEN in the last 168 hours.  No results found for this or any previous visit (from the past 240 hour(s)).       Radiology Studies: Ct Head Wo Contrast  Result Date: 01/02/2018 CLINICAL DATA:  Headache with left sided numbness. History of prostate carcinoma EXAM: CT HEAD WITHOUT CONTRAST TECHNIQUE: Contiguous axial images were obtained from the base of the skull through the vertex without intravenous contrast. COMPARISON:  June 10, 2011 FINDINGS: Brain: Mild diffuse atrophy is noted. There is no intracranial mass, hemorrhage, extra-axial fluid  collection, or midline shift. There is evidence of a prior small infarct in the head of the caudate nucleus on the right. There is evidence of a prior small infarct in the anterior right lentiform nucleus. There is also a small likely prior lacunar infarct in the mid right thalamus. There is patchy small vessel disease in the centra semiovale bilaterally. No apparent acute infarct evident. Vascular: No hyperdense vessel evident. There is calcification in each carotid siphon and distal vertebral artery. Skull: Bony calvarium appears intact. Sinuses/Orbits: There is mucosal thickening in several ethmoid air cells. Other visualized paranasal sinuses are clear. Orbits appear symmetric bilaterally. Other: Mastoid air cells are clear. IMPRESSION: Atrophy with periventricular small vessel disease. Prior small lacunar infarcts in the right basal ganglia and right thalamus regions. No acute infarct is demonstrable on this study. There are foci of arterial vascular calcification. There is mucosal thickening in several ethmoid air cells. Electronically Signed   By: Lowella Grip III M.D.   On: 2018-01-02 10:58   Mr  Mra Head Wo Contrast  Result Date: 12/24/2017 CLINICAL DATA:  Left arm and leg numbness. Headache. Altered mental status. EXAM: MRI HEAD WITHOUT CONTRAST MRA HEAD WITHOUT CONTRAST TECHNIQUE: Multiplanar, multiecho pulse sequences of the brain and surrounding structures were obtained without intravenous contrast. Angiographic images of the head were obtained using MRA technique without contrast. COMPARISON:  Head CT 12/24/2017 FINDINGS: MRI HEAD FINDINGS Multiple sequences are up to moderately motion degraded. Brain: There is a subcentimeter acute infarct in the posterior right pons. A 3 mm focus of trace diffusion hyperintensity in the right frontal lobe near the anterior aspect of the insula demonstrates normal ADC and may represent a subacute infarct. There is also a 1.6 cm focus of abnormal diffusion  signal and T2 hyperintensity without reduced ADC in the left body of the corpus callosum with slight local mass effect. Chronic lacunar infarcts are noted in the right basal ganglia, right thalamus, and left cerebral hemispheric white matter. Patchy T2 hyperintensities elsewhere in the cerebral white matter and pons are nonspecific but compatible with mild chronic small vessel ischemic disease. There is mild cerebral atrophy. Vascular: Partially abnormal distal left vertebral artery, more fully evaluated below. Other major intracranial vascular flow voids are preserved. Skull and upper cervical spine: Unremarkable bone marrow signal. Sinuses/Orbits: Bilateral cataract extraction. Paranasal sinuses and mastoid air cells are clear. Other: None. MRA HEAD FINDINGS The study is mildly motion degraded. The visualized distal right vertebral artery is widely patent. There is no flow related enhancement in the majority of the left V4 segment suggesting occlusion, possibly with minimal retrograde flow distally. AICA and SCA origins are grossly patent bilaterally. The basilar artery is widely patent. The PCAs are patent without evidence of significant proximal stenosis. Posterior communicating arteries are not identified and may be absent or diminutive. The internal carotid arteries are patent from skull base to carotid termini. Focally diminished signal in the distal right petrous ICA may be artifactual. There is the suggestion of mild right and possibly left paraclinoid stenosis versus artifact from motion and vessel orientation. The left cavernous ICA is slightly ectatic in appearance. A 2.5-3 mm funnel shaped outpouching from the left supraclinoid ICA in the posterior communicating region may represent an infundibulum or aneurysm. An associated vessel arising from this outpouching is not resolved on this MRA, however motion artifact limits assessment. The right A1 segment is absent. The left A1 segment is widely patent.  Both A2 segments are patent without evidence of significant stenosis. The MCAs are patent without evidence of proximal branch occlusion or M1 stenosis. Motion artifact limits assessment for MCA branch vessel stenoses. IMPRESSION: 1. Small acute right pontine infarct. 2. 1.6 cm focus of diffusion abnormality in the left body of the corpus callosum with mild mass effect favored to represent a subacute infarct. A small neoplasm and demyelination are alternative considerations. Follow-up brain MRI without and with contrast is recommended in 6 weeks. 3. Possible punctate subacute right frontal infarct. 4. Mild chronic small vessel ischemic disease. 5. Motion degraded MRA without evidence of major branch occlusion or flow limiting proximal stenosis in the anterior circulation. Possible mild bilateral ICA stenoses. 6. Occluded distal left vertebral artery. Electronically Signed   By: Logan Bores M.D.   On: 12/24/2017 12:54   Mr Brain Wo Contrast (neuro Protocol)  Result Date: 12/24/2017 CLINICAL DATA:  Left arm and leg numbness. Headache. Altered mental status. EXAM: MRI HEAD WITHOUT CONTRAST MRA HEAD WITHOUT CONTRAST TECHNIQUE: Multiplanar, multiecho pulse sequences of the brain and surrounding  structures were obtained without intravenous contrast. Angiographic images of the head were obtained using MRA technique without contrast. COMPARISON:  Head CT 12/24/2017 FINDINGS: MRI HEAD FINDINGS Multiple sequences are up to moderately motion degraded. Brain: There is a subcentimeter acute infarct in the posterior right pons. A 3 mm focus of trace diffusion hyperintensity in the right frontal lobe near the anterior aspect of the insula demonstrates normal ADC and may represent a subacute infarct. There is also a 1.6 cm focus of abnormal diffusion signal and T2 hyperintensity without reduced ADC in the left body of the corpus callosum with slight local mass effect. Chronic lacunar infarcts are noted in the right basal  ganglia, right thalamus, and left cerebral hemispheric white matter. Patchy T2 hyperintensities elsewhere in the cerebral white matter and pons are nonspecific but compatible with mild chronic small vessel ischemic disease. There is mild cerebral atrophy. Vascular: Partially abnormal distal left vertebral artery, more fully evaluated below. Other major intracranial vascular flow voids are preserved. Skull and upper cervical spine: Unremarkable bone marrow signal. Sinuses/Orbits: Bilateral cataract extraction. Paranasal sinuses and mastoid air cells are clear. Other: None. MRA HEAD FINDINGS The study is mildly motion degraded. The visualized distal right vertebral artery is widely patent. There is no flow related enhancement in the majority of the left V4 segment suggesting occlusion, possibly with minimal retrograde flow distally. AICA and SCA origins are grossly patent bilaterally. The basilar artery is widely patent. The PCAs are patent without evidence of significant proximal stenosis. Posterior communicating arteries are not identified and may be absent or diminutive. The internal carotid arteries are patent from skull base to carotid termini. Focally diminished signal in the distal right petrous ICA may be artifactual. There is the suggestion of mild right and possibly left paraclinoid stenosis versus artifact from motion and vessel orientation. The left cavernous ICA is slightly ectatic in appearance. A 2.5-3 mm funnel shaped outpouching from the left supraclinoid ICA in the posterior communicating region may represent an infundibulum or aneurysm. An associated vessel arising from this outpouching is not resolved on this MRA, however motion artifact limits assessment. The right A1 segment is absent. The left A1 segment is widely patent. Both A2 segments are patent without evidence of significant stenosis. The MCAs are patent without evidence of proximal branch occlusion or M1 stenosis. Motion artifact limits  assessment for MCA branch vessel stenoses. IMPRESSION: 1. Small acute right pontine infarct. 2. 1.6 cm focus of diffusion abnormality in the left body of the corpus callosum with mild mass effect favored to represent a subacute infarct. A small neoplasm and demyelination are alternative considerations. Follow-up brain MRI without and with contrast is recommended in 6 weeks. 3. Possible punctate subacute right frontal infarct. 4. Mild chronic small vessel ischemic disease. 5. Motion degraded MRA without evidence of major branch occlusion or flow limiting proximal stenosis in the anterior circulation. Possible mild bilateral ICA stenoses. 6. Occluded distal left vertebral artery. Electronically Signed   By: Logan Bores M.D.   On: 12/24/2017 12:54   US Carotid Bilateral (at [facility] Only)  Result Date: 12/25/2017 CLINICAL DATA:  Left-sided numbness. EXAM: BILATERAL CAROTID DUPLEX ULTRASOUND TECHNIQUE: Pearline Cables scale imaging, color Doppler and duplex ultrasound were performed of bilateral carotid and vertebral arteries in the neck. COMPARISON:  Brain MR 12/24/2017 FINDINGS: Criteria: Quantification of carotid stenosis is based on velocity parameters that correlate the residual internal carotid diameter with NASCET-based stenosis levels, using the diameter of the distal internal carotid lumen as the  denominator for stenosis measurement. The following velocity measurements were obtained: RIGHT ICA: 169 cm/sec CCA: 56 cm/sec SYSTOLIC ICA/CCA RATIO:  3 ECA:  198 cm/sec LEFT ICA: 330 cm/sec CCA: 64 cm/sec SYSTOLIC ICA/CCA RATIO:  3.9 ECA:  229 cm/sec RIGHT CAROTID ARTERY: Heterogeneous plaque at the right carotid bulb and proximal internal carotid artery. External carotid artery is patent. Narrowing in the proximal internal carotid artery with a peak systolic velocity of 294 centimeters/second. Mid and distal internal carotid artery are patent. RIGHT VERTEBRAL ARTERY: Antegrade flow and normal waveform in the right  vertebral artery. LEFT CAROTID ARTERY: Small amount of echogenic plaque at the left carotid bulb. Mildly elevated peak systolic velocity in the external carotid artery. Plaque in proximal internal carotid artery. Markedly elevated peak systolic velocity in the proximal internal carotid artery measuring 330 centimeters/second. Elevated peak systolic velocity in the mid internal carotid artery measuring 255 centimeters/second. Distal internal carotid artery is patent. LEFT VERTEBRAL ARTERY: Not visualized. Reported to be occluded based on the recent MRA. 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 Estimated degree of stenosis in left internal carotid artery is greater than 70%. Estimated degree of stenosis in the right internal carotid artery is 50-69%. Patent right vertebral artery. Left vertebral artery not visualized and reported to be occluded based on recent MRA. Electronically Signed   By: Markus Daft M.D.   On: 12/25/2017 11:58   Dg Chest Port 1 View  Result Date: 12/24/2017 CLINICAL DATA:  Cough EXAM: PORTABLE CHEST 1 VIEW COMPARISON:  02/25/2007 FINDINGS: Cardiac shadow is stable. Vascular congestion and mild edematous changes are seen bilaterally. No focal confluent infiltrate or sizable effusion is noted. IMPRESSION: Changes of CHF.  No other acute abnormality noted. Electronically Signed   By: Inez Catalina M.D.   On: 12/24/2017 13:56        Scheduled Meds: . aspirin  300 mg Rectal Daily   Or  . aspirin  325 mg Oral Daily  . enoxaparin (LOVENOX) injection  40 mg Subcutaneous Q24H  . furosemide  20 mg Intravenous BID  . guaiFENesin  1,200 mg Oral BID  . ipratropium-albuterol  3 mL Nebulization TID  . nicotine  21 mg Transdermal Daily   Continuous Infusions: . sodium chloride       LOS: 0 days    Time spent: 35mins    Kathie Dike, MD [facility] Pager [phone]  If 7PM-7AM, please contact  night-coverage [email or web address] Password [facility] 12/25/2017, 6:44 PM

## 2017-12-25 NOTE — Care Management Note (Signed)
Case Management Note  Patient Details  Name: Daniel Simon MRN: [id number] Date of Birth: 1934-10-15  Subjective/Objective:     Admitted with CVA and CHF. Pt from home, lives with family. He has PCP, transportation and insurance with drug coverage. His medications are managed by himself/his wife. He has a cane and "tobacco stick" to use at home if needed.                 Action/Plan: PT has recommended HH PT. Pt declines referral at this time. Plans to return home with self care. Wife at bedside agreeable to this plan.   Expected Discharge Date:      12/25/17            Expected Discharge Plan:  Home/Self Care  In-House Referral:  NA  Discharge planning Services  CM Consult  Post Acute Care Choice:  NA Choice offered to:  NA  Status of Service:  Completed, signed off  Sherald Barge, RN 12/25/2017, 11:37 AM

## 2017-12-26 ENCOUNTER — Encounter (HOSPITAL_COMMUNITY): Payer: Self-pay | Admitting: Radiology

## 2017-12-26 ENCOUNTER — Inpatient Hospital Stay (HOSPITAL_COMMUNITY): Payer: Medicare Other

## 2017-12-26 DIAGNOSIS — I6523 Occlusion and stenosis of bilateral carotid arteries: Secondary | ICD-10-CM

## 2017-12-26 DIAGNOSIS — I712 Thoracic aortic aneurysm, without rupture: Secondary | ICD-10-CM

## 2017-12-26 DIAGNOSIS — R591 Generalized enlarged lymph nodes: Secondary | ICD-10-CM | POA: Diagnosis present

## 2017-12-26 DIAGNOSIS — I7121 Aneurysm of the ascending aorta, without rupture: Secondary | ICD-10-CM | POA: Diagnosis present

## 2017-12-26 LAB — BASIC METABOLIC PANEL
Anion gap: 9 (ref 5–15)
BUN: 25 mg/dL — ABNORMAL HIGH (ref 6–20)
CALCIUM: 9.3 mg/dL (ref 8.9–10.3)
CO2: 27 mmol/L (ref 22–32)
Chloride: 105 mmol/L (ref 101–111)
Creatinine, Ser: 0.97 mg/dL (ref 0.61–1.24)
GFR calc Af Amer: >60 mL/min (ref >60)
GFR calc non Af Amer: >60 mL/min (ref >60)
GLUCOSE: 105 mg/dL — AB (ref 65–99)
Potassium: 3.6 mmol/L (ref 3.5–5.1)
Sodium: 141 mmol/L (ref 135–145)

## 2017-12-26 MED ORDER — IOPAMIDOL (ISOVUE-370) INJECTION 76%
100.0000 mL | Freq: Once | INTRAVENOUS | Status: AC | PRN
  Administered 2017-12-26: 100 mL via INTRAVENOUS

## 2017-12-26 MED ORDER — CLOPIDOGREL BISULFATE 75 MG PO TABS
75.0000 mg | ORAL_TABLET | Freq: Every day | ORAL | Status: DC
  Filled 2017-12-26: qty 1

## 2017-12-26 MED ORDER — ATORVASTATIN CALCIUM 40 MG PO TABS: 40.0000 mg | ORAL_TABLET | Freq: Every day | ORAL | 1 refills | Status: DC

## 2017-12-26 MED ORDER — FUROSEMIDE 40 MG PO TABS: 40.0000 mg | ORAL_TABLET | Freq: Every day | ORAL | 11 refills | Status: AC

## 2017-12-26 MED ORDER — APIXABAN 5 MG PO TABS: 10.0000 mg | ORAL_TABLET | Freq: Two times a day (BID) | ORAL | Status: DC

## 2017-12-26 MED ORDER — CARVEDILOL 6.25 MG PO TABS: 6.2500 mg | ORAL_TABLET | Freq: Two times a day (BID) | ORAL | 1 refills | Status: DC

## 2017-12-26 MED ORDER — GUAIFENESIN ER 600 MG PO TB12: 1200.0000 mg | ORAL_TABLET | Freq: Two times a day (BID) | ORAL | 0 refills | Status: DC

## 2017-12-26 MED ORDER — POTASSIUM CHLORIDE ER 20 MEQ PO TBCR: 20.0000 meq | EXTENDED_RELEASE_TABLET | Freq: Every day | ORAL | 1 refills | Status: DC

## 2017-12-26 MED ORDER — TIOTROPIUM BROMIDE MONOHYDRATE 18 MCG IN CAPS: 18.0000 ug | ORAL_CAPSULE | Freq: Every day | RESPIRATORY_TRACT | 2 refills | Status: DC

## 2017-12-26 MED ORDER — ALBUTEROL SULFATE HFA 108 (90 BASE) MCG/ACT IN AERS: 2.0000 | INHALATION_SPRAY | Freq: Four times a day (QID) | RESPIRATORY_TRACT | 2 refills | Status: AC | PRN

## 2017-12-26 MED ORDER — APIXABAN 5 MG PO TABS: ORAL_TABLET | ORAL | 2 refills | Status: DC

## 2017-12-26 MED ORDER — ATORVASTATIN CALCIUM 40 MG PO TABS
40.0000 mg | ORAL_TABLET | Freq: Every day | ORAL | Status: DC
  Filled 2017-12-26: qty 1

## 2017-12-26 NOTE — Progress Notes (Signed)
Physical Therapy Treatment Patient Details Name: Daniel Simon MRN: [id number] DOB: 24-May-1935 Today's Date: 12/26/2017    History of Present Illness Daniel Simon is a 83 y.o. male with medical history significant of prostate cancer, renal stones, has not seen his primary care physician in quite some time.  Patient reports that yesterday evening he developed acute onset of headache.  He took an Aleve with some improvement of his symptoms.  He also had left-sided upper extremity and lower extremity numbness.  Denies any changes in vision.  Denies any dysarthria or slurring of speech.  He reports that he had no trouble walking although his wife does say that she noticed him having difficulty with ambulation.  He was also feeling somewhat lightheaded, but this has improved. MRI positive for acute infarct.     PT Comments    Pt sitting in chair upon PTA entrance for therapy.  Pt friendly and willing to participate today.  Gait training with QC and no AD.  Pt with tendency to drift slightly to Rt when distracted during gait training.  Pt presents with improve activity tolerance with ability to increased distance today with some fatigue following.  Added balance activities including sidestep and retro gait and tandem stance to improve awareness of balance deficits.  EOS pt left in chair with call bell within reach and family member in room.     Follow Up Recommendations  Home health PT;Supervision for mobility/OOB     Equipment Recommendations  Cane    Recommendations for Other Services       Precautions / Restrictions Precautions Precautions: Fall Restrictions Weight Bearing Restrictions: No    Mobility  Bed Mobility               General bed mobility comments: Pt sitting in chair upon therapist entrance  Transfers Overall transfer level: Modified independent Equipment used: None Transfers: Sit to/from Stand Sit to Stand: Supervision         General transfer  comment: safe mechanics with STS  Ambulation/Gait Ambulation/Gait assistance: Min guard Ambulation Distance (Feet): 250 Feet Assistive device: None;Quad cane Gait Pattern/deviations: Decreased step length - right;Decreased step length - left;Decreased stride length;Drifts right/left Gait velocity: decreased   General Gait Details: slight drifting to the right wihtout ADx3, no LOB episodes during session   Stairs             Wheelchair Mobility    Modified Rankin (Stroke Patients Only)       Balance                               High Level Balance Comments: side step, retro and tandem stance 2x 10"            Cognition Arousal/Alertness: Awake/alert Behavior During Therapy: WFL for tasks assessed/performed Overall Cognitive Status: Within Functional Limits for tasks assessed                                        Exercises      General Comments        Pertinent Vitals/Pain Pain Assessment: No/denies pain    Home Living                      Prior Function            PT  Goals (current goals can now be found in the care plan section) Progress towards PT goals: Progressing toward goals    Frequency    7X/week      PT Plan Current plan remains appropriate    Co-evaluation              AM-PAC PT "6 Clicks" Daily Activity  Outcome Measure  Difficulty turning over in bed (including adjusting bedclothes, sheets and blankets)?: None Difficulty moving from lying on back to sitting on the side of the bed? : None Difficulty sitting down on and standing up from a chair with arms (e.g., wheelchair, bedside commode, etc,.)?: None Help needed moving to and from a bed to chair (including a wheelchair)?: None Help needed walking in hospital room?: A Little Help needed climbing 3-5 steps with a railing? : A Little 6 Click Score: 22    End of Session Equipment Utilized During Treatment: Gait belt Activity  Tolerance: Patient tolerated treatment well;Patient limited by fatigue Patient left: in chair;with call bell/phone within reach;with family/visitor present Nurse Communication: Mobility status PT Visit Diagnosis: Unsteadiness on feet (R26.81);Other abnormalities of gait and mobility (R26.89);Muscle weakness (generalized) (M62.81)     Time: 8341-9622 PT Time Calculation (min) (ACUTE ONLY): 15 min  Charges:  $Therapeutic Activity: 8-22 mins                    G Codes:      Ihor Austin, LPTA; CBIS [phone]  Aldona Lento 12/26/2017, 11:31 AM

## 2017-12-26 NOTE — Discharge Instructions (Signed)
Information on my medicine - ELIQUIS (apixaban)  This medication education was reviewed with me or my healthcare representative as part of my discharge preparation.  The pharmacist that spoke with me during my hospital stay was:  Despina Pole, [facility]  Why was Eliquis prescribed for you? Eliquis was prescribed to treat blood clots that may have been found in the veins of your legs (deep vein thrombosis) or in your lungs (pulmonary embolism) and to reduce the risk of them occurring again.  What do You need to know about Eliquis ? The starting dose is 10 mg (two 5 mg tablets) taken TWICE daily for the FIRST SEVEN (7) DAYS, then on (enter date)  01-02-18  the dose is reduced to ONE 5 mg tablet taken TWICE daily.  Eliquis may be taken with or without food.   Try to take the dose about the same time in the morning and in the evening. If you have difficulty swallowing the tablet whole please discuss with your pharmacist how to take the medication safely.  Take Eliquis exactly as prescribed and DO NOT stop taking Eliquis without talking to the doctor who prescribed the medication.  Stopping may increase your risk of developing a new blood clot.  Refill your prescription before you run out.  After discharge, you should have regular check-up appointments with your healthcare provider that is prescribing your Eliquis.    What do you do if you miss a dose? If a dose of ELIQUIS is not taken at the scheduled time, take it as soon as possible on the same day and twice-daily administration should be resumed. The dose should not be doubled to make up for a missed dose.  Important Safety Information A possible side effect of Eliquis is bleeding. You should call your healthcare provider right away if you experience any of the following: ? Bleeding from an injury or your nose that does not stop. ? Unusual colored urine (red or dark brown) or unusual colored stools (red or black). ? Unusual bruising for  unknown reasons. ? A serious fall or if you hit your head (even if there is no bleeding).  Some medicines may interact with Eliquis and might increase your risk of bleeding or clotting while on Eliquis. To help avoid this, consult your healthcare provider or pharmacist prior to using any new prescription or non-prescription medications, including herbals, vitamins, non-steroidal anti-inflammatory drugs (NSAIDs) and supplements.  This website has more information on Eliquis (apixaban): http://www.eliquis.com/eliquis/home

## 2017-12-26 NOTE — Care Management Important Message (Signed)
Important Message  Patient Details  Name: Daniel Simon MRN: [id number] Date of Birth: 03/28/1935   Medicare Important Message Given:  Yes    Shelda Altes 12/26/2017, 11:50 AM

## 2017-12-26 NOTE — Discharge Summary (Signed)
Physician Discharge Summary  Daniel Simon:[id number] DOB: 01-Nov-1934 DOA: 12/24/2017  PCP: Dione Housekeeper, MD  Admit date: 12/24/2017 Discharge date: 12/26/2017  Admitted From: Home Disposition: Home  Recommendations for Outpatient Follow-up:  1. Follow up with PCP in 1-2 weeks 2. Please obtain BMP/CBC in one week 3. Patient will follow-up with vascular surgery in 2 weeks 4. He is been set up to follow-up with cardiology on 01/07/2018 5. He will come to the [facility] cardiology office to obtain an event monitor on 5/29 6. He should be considered for outpatient CT chest with contrast to further evaluate subcarinal lymphadenopathy 7. He will need CT Angio of his chest in 1 year to follow as an aortic aneurysm  Home Health: Equipment/Devices:  Discharge Condition: Stable CODE STATUS: Full code Diet recommendation: Heart Healthy   Brief/Interim Summary: 83 year old male presents to the hospital with left-sided numbness.  MRI brain confirmed bilateral infarcts.  Further work-up indicated bilateral carotid stenosis.  He was seen by neurology as well as case was discussed with vascular surgery.  Plan is for outpatient follow-up.  He was also noted to have acute CHF.  Ejection fraction noted to be 45 to 50%.  He was diuresed with IV Lasix with plans for outpatient cardiology follow-up.  Discharge Diagnoses:  Active Problems:   Stroke (cerebrum) (HCC)   Tobacco abuse   Left sided numbness   HTN (hypertension)   Acute CHF (congestive heart failure) (HCC)   Ascending aortic aneurysm (HCC)   Carotid stenosis, bilateral   Lymphadenopathy  1. Acute CVA.  Patient found to have bilateral infarcts on MRI imaging suggestive of embolic phenomena.  He was seen by neurology who recommended 30-day event monitor.  He was noted to have an elevated LDL in the 130 range and was started on statin.  Per neurology, recommendation for dual antiplatelet therapy.  He was also found to have bilateral  carotid stenosis.  This was reviewed with vascular surgery who recommended outpatient follow-up in the next 2 weeks to be considered for endarterectomy.  Further evaluation with CTA of the neck was performed that showed severe fibrofatty plaque of the aorta and great vessels.  This was reviewed with Dr. Bridgett Larsson who could not rule out underlying thrombus and recommended the patient be started on anticoagulation.  He will follow patient up in 2 weeks 2. Ascending aortic aneurysm.  Incidental finding on CT imaging.  Will need surveillance yearly with CTA of chest. 3. Acute systolic congestive heart failure.  Ejection fraction of 45 to 50%.  He also was noted to have some apical/septal hypokinesis.  Did not have any chest pain or elevated troponins.  Patient was diuresed with intravenous Lasix and is overall shortness of breath has resolved.  He is able to ambulate on room air without difficulty.  He is been started on beta-blockers and will be continued on oral Lasix.  He is been set up with cardiology to discuss further ischemic work-up. 4. Hypertension.  Blood pressure was initially elevated, but did improve with diuresis.  He is also been started on beta-blockers. 5. Emphysema.  Noted on CT scan.  He has a long history of tobacco use.  He has been started on Spiriva and albuterol as needed.  He has been strongly advised to quit smoking 6. Subcarinal lymphadenopathy.  Incidental finding on CT imaging.  This should be further evaluated by CT chest with contrast.  This was offered to the patient in the hospital, but he would prefer to discharge  home today and pursue this as an outpatient.  Discharge Instructions  Discharge Instructions    Ambulatory referral to Vascular Surgery   Complete by:  As directed    Follow up carotid stenosis. Follow up in 2 weeks   Diet - low sodium heart healthy   Complete by:  As directed    Increase activity slowly   Complete by:  As directed      Allergies as of 12/26/2017    No Known Allergies     Medication List    TAKE these medications   albuterol 108 (90 Base) MCG/ACT inhaler Commonly known as:  PROVENTIL HFA;VENTOLIN HFA Inhale 2 puffs into the lungs every 6 (six) hours as needed for wheezing or shortness of breath.   apixaban 5 MG Tabs tablet Commonly known as:  ELIQUIS Take 10mg  po bid for 7 days then 5mg  po bid   aspirin EC 81 MG tablet Take 81 mg by mouth daily.   atorvastatin 40 MG tablet Commonly known as:  LIPITOR Take 1 tablet (40 mg total) by mouth daily at 6 PM.   carvedilol 6.25 MG tablet Commonly known as:  COREG Take 1 tablet (6.25 mg total) by mouth 2 (two) times daily with a meal.   furosemide 40 MG tablet Commonly known as:  LASIX Take 1 tablet (40 mg total) by mouth daily.   guaiFENesin 600 MG 12 hr tablet Commonly known as:  MUCINEX Take 2 tablets (1,200 mg total) by mouth 2 (two) times daily.   Potassium Chloride ER 20 MEQ Tbcr Take 20 mEq by mouth daily.   tiotropium 18 MCG inhalation capsule Commonly known as:  SPIRIVA HANDIHALER Place 1 capsule (18 mcg total) into inhaler and inhale daily.      Follow-up Information    [facility] .   Specialty:  Radiology Contact information: [address] [id number] [place] [address] [phone]       Arnoldo Lenis, MD Follow up on 01/07/2018.   Specialty:  Cardiology Why:  9:00 Contact information: [place] [address] [facility],[place] office Follow up on 12/31/2017.   Why:  4:00pm,  Come to clinic for event monitor Contact information: [address] [place] [address]       Conrad Bostwick, MD Follow up.   Specialties:  Vascular Surgery, Cardiology Why:  office will call you for appointment in 2 weeks Contact information: [place] [address] [phone]          No Known  Allergies  Consultations:  Neurology  Vascular surgery (telephone)   Procedures/Studies: Ct Head Wo Contrast  Result Date: 12/24/2017 CLINICAL DATA:  Headache with left sided numbness. History of prostate carcinoma EXAM: CT HEAD WITHOUT CONTRAST TECHNIQUE: Contiguous axial images were obtained from the base of the skull through the vertex without intravenous contrast. COMPARISON:  June 10, 2011 FINDINGS: Brain: Mild diffuse atrophy is noted. There is no intracranial mass, hemorrhage, extra-axial fluid collection, or midline shift. There is evidence of a prior small infarct in the head of the caudate nucleus on the right. There is evidence of a prior small infarct in the anterior right lentiform nucleus. There is also a small likely prior lacunar infarct in the mid right thalamus. There is patchy small vessel disease in the centra semiovale bilaterally. No apparent acute infarct evident. Vascular: No hyperdense vessel evident. There is calcification  in each carotid siphon and distal vertebral artery. Skull: Bony calvarium appears intact. Sinuses/Orbits: There is mucosal thickening in several ethmoid air cells. Other visualized paranasal sinuses are clear. Orbits appear symmetric bilaterally. Other: Mastoid air cells are clear. IMPRESSION: Atrophy with periventricular small vessel disease. Prior small lacunar infarcts in the right basal ganglia and right thalamus regions. No acute infarct is demonstrable on this study. There are foci of arterial vascular calcification. There is mucosal thickening in several ethmoid air cells. Electronically Signed   By: Lowella Grip III M.D.   On: 12/24/2017 10:58   Ct Angio Neck W Or Wo Contrast  Result Date: 12/26/2017 CLINICAL DATA:  83 y/o  M; carotid stenosis for follow-up. EXAM: CT ANGIOGRAPHY NECK TECHNIQUE: Multidetector CT imaging of the neck was performed using the standard protocol during bolus administration of intravenous contrast. Multiplanar CT  image reconstructions and MIPs were obtained to evaluate the vascular anatomy. Carotid stenosis measurements (when applicable) are obtained utilizing NASCET criteria, using the distal internal carotid diameter as the denominator. CONTRAST:  120mL ISOVUE-370 IOPAMIDOL (ISOVUE-370) INJECTION 76% COMPARISON:  12/25/2017 carotid ultrasound.  12/24/2017 MRA head. FINDINGS: Aortic arch: 4.1 cm ascending aortic aneurysm. Severe predominantly fibrofatty plaque of the aorta and proximal great vessels. Small outpouching of the brachiocephalic artery origin may represent penetrating ulcer or fibrofatty plaque ulceration (series 7, image 114). Second penetrating ulcer or fibrofatty plaque ulceration of the proximal left subclavian artery (series 7, image 138). Multiple segments of stenosis of the left subclavian artery up to 50% moderate. Right carotid system: Severe predominantly fibrofatty plaque with moderate 50% proximal ICA stenosis. Left carotid system: Severe predominantly fibrofatty plaque of the carotid bifurcation with severe 70% proximal ICA stenosis. Vertebral arteries: Right vertebral artery origin moderate 50-60% stenosis. Occlusion of left vertebral arteries V1, V2, and V3 segments. Skeleton: Cervical spondylosis with degenerative changes greatest at the C5-C7 levels or uncovertebral and facet hypertrophy encroach on the neural foramen. Productive changes of the anterior C1-2 articulation. No high-grade bony canal stenosis. Other neck: Subcarinal lymphadenopathy measuring 25 x 30 mm (series 4, image 5). Calcifications within lower paratracheal lymph nodes. Upper chest: Severe emphysema. IMPRESSION: 1. 4.1 cm ascending aortic aneurysm. Recommend annual imaging followup by CTA or MRA. This recommendation follows 2010 ACCF/AHA/AATS/ACR/ASA/SCA/SCAI/SIR/STS/SVM Guidelines for the Diagnosis and Management of Patients with Thoracic Aortic Disease. Circulation. 2010; 121: X381-W299. 2. Severe predominantly fibrofatty  plaque of the aorta, great vessels, and carotid bifurcations. 3. Penetrating ulcer/fibrofatty plaque ulceration of the brachiocephalic artery origin and proximal left subclavian artery. 4. Right proximal ICA moderate 50% stenosis. 5. Left proximal ICA severe 70% stenosis. 6. Right vertebral origin moderate 50-60% stenosis. 7. Occluded left vertebral artery V1, V2, and V3 segments. Patent left V4 segment. 8. Severe emphysema. 9. Subcarinal lymphadenopathy which may be due to granulomatous disease given calcified lower paratracheal lymph nodes. Lymphoproliferative disorder or metastasis is also possible. Electronically Signed   By: Kristine Garbe M.D.   On: 12/26/2017 16:00   Mr Jodene Nam Head Wo Contrast  Result Date: 12/24/2017 CLINICAL DATA:  Left arm and leg numbness. Headache. Altered mental status. EXAM: MRI HEAD WITHOUT CONTRAST MRA HEAD WITHOUT CONTRAST TECHNIQUE: Multiplanar, multiecho pulse sequences of the brain and surrounding structures were obtained without intravenous contrast. Angiographic images of the head were obtained using MRA technique without contrast. COMPARISON:  Head CT 12/24/2017 FINDINGS: MRI HEAD FINDINGS Multiple sequences are up to moderately motion degraded. Brain: There is a subcentimeter acute infarct in the posterior right pons. A 3 mm  focus of trace diffusion hyperintensity in the right frontal lobe near the anterior aspect of the insula demonstrates normal ADC and may represent a subacute infarct. There is also a 1.6 cm focus of abnormal diffusion signal and T2 hyperintensity without reduced ADC in the left body of the corpus callosum with slight local mass effect. Chronic lacunar infarcts are noted in the right basal ganglia, right thalamus, and left cerebral hemispheric white matter. Patchy T2 hyperintensities elsewhere in the cerebral white matter and pons are nonspecific but compatible with mild chronic small vessel ischemic disease. There is mild cerebral atrophy.  Vascular: Partially abnormal distal left vertebral artery, more fully evaluated below. Other major intracranial vascular flow voids are preserved. Skull and upper cervical spine: Unremarkable bone marrow signal. Sinuses/Orbits: Bilateral cataract extraction. Paranasal sinuses and mastoid air cells are clear. Other: None. MRA HEAD FINDINGS The study is mildly motion degraded. The visualized distal right vertebral artery is widely patent. There is no flow related enhancement in the majority of the left V4 segment suggesting occlusion, possibly with minimal retrograde flow distally. AICA and SCA origins are grossly patent bilaterally. The basilar artery is widely patent. The PCAs are patent without evidence of significant proximal stenosis. Posterior communicating arteries are not identified and may be absent or diminutive. The internal carotid arteries are patent from skull base to carotid termini. Focally diminished signal in the distal right petrous ICA may be artifactual. There is the suggestion of mild right and possibly left paraclinoid stenosis versus artifact from motion and vessel orientation. The left cavernous ICA is slightly ectatic in appearance. A 2.5-3 mm funnel shaped outpouching from the left supraclinoid ICA in the posterior communicating region may represent an infundibulum or aneurysm. An associated vessel arising from this outpouching is not resolved on this MRA, however motion artifact limits assessment. The right A1 segment is absent. The left A1 segment is widely patent. Both A2 segments are patent without evidence of significant stenosis. The MCAs are patent without evidence of proximal branch occlusion or M1 stenosis. Motion artifact limits assessment for MCA branch vessel stenoses. IMPRESSION: 1. Small acute right pontine infarct. 2. 1.6 cm focus of diffusion abnormality in the left body of the corpus callosum with mild mass effect favored to represent a subacute infarct. A small neoplasm and  demyelination are alternative considerations. Follow-up brain MRI without and with contrast is recommended in 6 weeks. 3. Possible punctate subacute right frontal infarct. 4. Mild chronic small vessel ischemic disease. 5. Motion degraded MRA without evidence of major branch occlusion or flow limiting proximal stenosis in the anterior circulation. Possible mild bilateral ICA stenoses. 6. Occluded distal left vertebral artery. Electronically Signed   By: Logan Bores M.D.   On: 12/24/2017 12:54   Mr Brain Wo Contrast (neuro Protocol)  Result Date: 12/24/2017 CLINICAL DATA:  Left arm and leg numbness. Headache. Altered mental status. EXAM: MRI HEAD WITHOUT CONTRAST MRA HEAD WITHOUT CONTRAST TECHNIQUE: Multiplanar, multiecho pulse sequences of the brain and surrounding structures were obtained without intravenous contrast. Angiographic images of the head were obtained using MRA technique without contrast. COMPARISON:  Head CT 12/24/2017 FINDINGS: MRI HEAD FINDINGS Multiple sequences are up to moderately motion degraded. Brain: There is a subcentimeter acute infarct in the posterior right pons. A 3 mm focus of trace diffusion hyperintensity in the right frontal lobe near the anterior aspect of the insula demonstrates normal ADC and may represent a subacute infarct. There is also a 1.6 cm focus of abnormal diffusion signal and T2  hyperintensity without reduced ADC in the left body of the corpus callosum with slight local mass effect. Chronic lacunar infarcts are noted in the right basal ganglia, right thalamus, and left cerebral hemispheric white matter. Patchy T2 hyperintensities elsewhere in the cerebral white matter and pons are nonspecific but compatible with mild chronic small vessel ischemic disease. There is mild cerebral atrophy. Vascular: Partially abnormal distal left vertebral artery, more fully evaluated below. Other major intracranial vascular flow voids are preserved. Skull and upper cervical spine:  Unremarkable bone marrow signal. Sinuses/Orbits: Bilateral cataract extraction. Paranasal sinuses and mastoid air cells are clear. Other: None. MRA HEAD FINDINGS The study is mildly motion degraded. The visualized distal right vertebral artery is widely patent. There is no flow related enhancement in the majority of the left V4 segment suggesting occlusion, possibly with minimal retrograde flow distally. AICA and SCA origins are grossly patent bilaterally. The basilar artery is widely patent. The PCAs are patent without evidence of significant proximal stenosis. Posterior communicating arteries are not identified and may be absent or diminutive. The internal carotid arteries are patent from skull base to carotid termini. Focally diminished signal in the distal right petrous ICA may be artifactual. There is the suggestion of mild right and possibly left paraclinoid stenosis versus artifact from motion and vessel orientation. The left cavernous ICA is slightly ectatic in appearance. A 2.5-3 mm funnel shaped outpouching from the left supraclinoid ICA in the posterior communicating region may represent an infundibulum or aneurysm. An associated vessel arising from this outpouching is not resolved on this MRA, however motion artifact limits assessment. The right A1 segment is absent. The left A1 segment is widely patent. Both A2 segments are patent without evidence of significant stenosis. The MCAs are patent without evidence of proximal branch occlusion or M1 stenosis. Motion artifact limits assessment for MCA branch vessel stenoses. IMPRESSION: 1. Small acute right pontine infarct. 2. 1.6 cm focus of diffusion abnormality in the left body of the corpus callosum with mild mass effect favored to represent a subacute infarct. A small neoplasm and demyelination are alternative considerations. Follow-up brain MRI without and with contrast is recommended in 6 weeks. 3. Possible punctate subacute right frontal infarct. 4.  Mild chronic small vessel ischemic disease. 5. Motion degraded MRA without evidence of major branch occlusion or flow limiting proximal stenosis in the anterior circulation. Possible mild bilateral ICA stenoses. 6. Occluded distal left vertebral artery. Electronically Signed   By: Logan Bores M.D.   On: 12/24/2017 12:54   US Carotid Bilateral (at [facility] And [facility] Only)  Result Date: 12/25/2017 CLINICAL DATA:  Left-sided numbness. EXAM: BILATERAL CAROTID DUPLEX ULTRASOUND TECHNIQUE: Pearline Cables scale imaging, color Doppler and duplex ultrasound were performed of bilateral carotid and vertebral arteries in the neck. COMPARISON:  Brain MR 12/24/2017 FINDINGS: Criteria: Quantification of carotid stenosis is based on velocity parameters that correlate the residual internal carotid diameter with NASCET-based stenosis levels, using the diameter of the distal internal carotid lumen as the denominator for stenosis measurement. The following velocity measurements were obtained: RIGHT ICA: 169 cm/sec CCA: 56 cm/sec SYSTOLIC ICA/CCA RATIO:  3 ECA:  198 cm/sec LEFT ICA: 330 cm/sec CCA: 64 cm/sec SYSTOLIC ICA/CCA RATIO:  3.9 ECA:  229 cm/sec RIGHT CAROTID ARTERY: Heterogeneous plaque at the right carotid bulb and proximal internal carotid artery. External carotid artery is patent. Narrowing in the proximal internal carotid artery with a peak systolic velocity of 578 centimeters/second. Mid and distal internal carotid artery are patent. RIGHT VERTEBRAL ARTERY: Antegrade  flow and normal waveform in the right vertebral artery. LEFT CAROTID ARTERY: Small amount of echogenic plaque at the left carotid bulb. Mildly elevated peak systolic velocity in the external carotid artery. Plaque in proximal internal carotid artery. Markedly elevated peak systolic velocity in the proximal internal carotid artery measuring 330 centimeters/second. Elevated peak systolic velocity in the mid internal carotid artery measuring 255 centimeters/second. Distal  internal carotid artery is patent. LEFT VERTEBRAL ARTERY: Not visualized. Reported to be occluded based on the recent MRA. 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 Estimated degree of stenosis in left internal carotid artery is greater than 70%. Estimated degree of stenosis in the right internal carotid artery is 50-69%. Patent right vertebral artery. Left vertebral artery not visualized and reported to be occluded based on recent MRA. Electronically Signed   By: Markus Daft M.D.   On: 12/25/2017 11:58   Dg Chest Port 1 View  Result Date: 12/24/2017 CLINICAL DATA:  Cough EXAM: PORTABLE CHEST 1 VIEW COMPARISON:  02/25/2007 FINDINGS: Cardiac shadow is stable. Vascular congestion and mild edematous changes are seen bilaterally. No focal confluent infiltrate or sizable effusion is noted. IMPRESSION: Changes of CHF.  No other acute abnormality noted. Electronically Signed   By: Inez Catalina M.D.   On: 12/24/2017 13:56    Echo:- Left ventricle: Distal septal / apical hypokinesis. The cavity   size was mildly dilated. Wall thickness was increased in a   pattern of mild LVH. Systolic function was mildly reduced. The   estimated ejection fraction was in the range of 45% to 50%.   Doppler parameters are consistent with abnormal left ventricular   relaxation (grade 1 diastolic dysfunction). - Aortic valve: There was mild regurgitation. Valve area (VTI):   1.61 cm^2. Valve area (Vmax): 1.67 cm^2. Valve area (Vmean): 1.66   cm^2. - Mitral valve: There was mild regurgitation. - Left atrium: The atrium was moderately dilated. - Atrial septum: No defect or patent foramen ovale was identified. - Pulmonary arteries: PA peak pressure: 35 mm Hg (S).   Subjective: Feeling better.  Shortness of breath is better.  No chest pain.  Wants to go home.  Discharge Exam: Vitals:   12/26/17 0624 12/26/17 1053  BP: (!) 148/88 (!) 116/93  Pulse: 95 86  Resp: 16  15  Temp: 97.8 F (36.6 C) 98.1 F (36.7 C)  SpO2: 91% 94%   Vitals:   12/26/17 0200 12/26/17 0500 12/26/17 0624 12/26/17 1053  BP: (!) 146/92  (!) 148/88 (!) 116/93  Pulse: 90  95 86  Resp: 16  16 15   Temp: 97.8 F (36.6 C)  97.8 F (36.6 C) 98.1 F (36.7 C)  TempSrc: Oral  Oral Oral  SpO2: 92%  91% 94%  Weight:  62 kg (136 lb 11.2 oz)    Height:        General: Pt is alert, awake, not in acute distress Cardiovascular: RRR, S1/S2 +, no rubs, no gallops Respiratory: CTA bilaterally, no wheezing, no rhonchi Abdominal: Soft, NT, ND, bowel sounds + Extremities: Trace edema, no cyanosis    The results of significant diagnostics from this hospitalization (including imaging, microbiology, ancillary and laboratory) are listed below for reference.     Microbiology: No results found for this or any previous visit (from the past 240 hour(s)).   Labs: BNP (last 3 results) Recent Labs    12/24/17 1400  BNP 326.7*   Basic Metabolic Panel: Recent Labs  Lab 12/24/17 1025  12/25/17 0548 12/26/17 0543  NA 142 142 141  K 3.9 3.6 3.6  CL 108 106 105  CO2 25 27 27   GLUCOSE 93 95 105*  BUN 16 18 25*  CREATININE 0.94 0.85 0.97  CALCIUM 9.3 8.9 9.3   Liver Function Tests: Recent Labs  Lab 12/24/17 1025  AST 16  ALT 10*  ALKPHOS 86  BILITOT 0.9  PROT 7.5  ALBUMIN 4.2   No results for input(s): LIPASE, AMYLASE in the last 168 hours. No results for input(s): AMMONIA in the last 168 hours. CBC: Recent Labs  Lab 12/24/17 1025  WBC 6.1  NEUTROABS 3.8  HGB 16.8  HCT 49.3  MCV 89.6  PLT 104*   Cardiac Enzymes: Recent Labs  Lab 12/24/17 1025  TROPONINI <0.03   BNP: Invalid input(s): POCBNP CBG: No results for input(s): GLUCAP in the last 168 hours. D-Dimer No results for input(s): DDIMER in the last 72 hours. Hgb A1c Recent Labs    12/25/17 0548  HGBA1C 4.8   Lipid Profile Recent Labs    12/25/17 0548  CHOL 192  HDL 25*  LDLCALC 139*  TRIG 139   CHOLHDL 7.7   Thyroid function studies No results for input(s): TSH, T4TOTAL, T3FREE, THYROIDAB in the last 72 hours.  Invalid input(s): FREET3 Anemia work up No results for input(s): VITAMINB12, FOLATE, FERRITIN, TIBC, IRON, RETICCTPCT in the last 72 hours. Urinalysis    Component Value Date/Time   COLORURINE STRAW (A) 12/24/2017 1025   APPEARANCEUR CLEAR 12/24/2017 1025   LABSPEC 1.006 12/24/2017 1025   PHURINE 7.0 12/24/2017 1025   GLUCOSEU NEGATIVE 12/24/2017 1025   HGBUR SMALL (A) 12/24/2017 1025   BILIRUBINUR NEGATIVE 12/24/2017 1025   KETONESUR NEGATIVE 12/24/2017 1025   PROTEINUR NEGATIVE 12/24/2017 1025   NITRITE NEGATIVE 12/24/2017 1025   LEUKOCYTESUR NEGATIVE 12/24/2017 1025   Sepsis Labs Invalid input(s): PROCALCITONIN,  WBC,  LACTICIDVEN Microbiology No results found for this or any previous visit (from the past 240 hour(s)).   Time coordinating discharge: 40 minutes  SIGNED:   Kathie Dike, MD  [facility] 12/26/2017, 7:12 PM Pager   If 7PM-7AM, please contact night-coverage [email or web address] Password [email or web address]

## 2017-12-26 NOTE — Consult Note (Addendum)
[place] A. Merlene Laughter, MD     [email or web address]          Daniel Simon is an 83 y.o. male.   ASSESSMENT/PLAN: 1. 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 The patient does have a left frontal infarct along with a high-grade stenosis involving the left ICA. It is unclear if this infarct is due to the stenosis or suspected embolic phenomena. I recommend that the patient be placed on dual antiplatelet agents 3 months. Single agent after. There has been some question about - anticoagulation given his aortic disease. However, antiplatelet agents seems best option with less life threatening bleeding risk based on recent reviews. He can have the left carotid stenosis operated on while his event monitor is being done to check for atrial fibrillation. Vascular surgery consultation is recommended.  No new complaints.          GENERAL: The patient is sitting in a chair and seemed to be doing well.  HEENT: This is normal.  ABDOMEN: soft  EXTREMITIES: No edema   BACK: This is normal.  SKIN: Normal by inspection.    MENTAL STATUS: Alert and oriented - including name, place, age and month and year. Speech- slight dysarthric although there reports this is his baseline, language and cognition are generally intact. Judgment and insight normal.   CRANIAL NERVES: Pupils are equal, round and reactive to light and accomodation; extra ocular movements are full, there is no significant nystagmus; visual fields are full; upper and lower facial muscles are normal in strength and symmetric, there is no flattening of the nasolabial folds; tongue is midline; uvula is midline; shoulder elevation is normal.  MOTOR: Normal tone, bulk and strength; no pronator drift. There is no drift of the upper lower extremities.  COORDINATION: Left finger to nose is normal, right finger to nose is normal,  No rest tremor; no intention tremor; no postural tremor; no bradykinesia.  REFLEXES: Deep tendon reflexes are symmetrical and normal. Plantar reflexes are flexor bilaterally.   SENSATION: Normal to light touch, temperature, and pain. No visual extinction but seemed to have extinction involving the left upper extremity at times.     NIHSS 2.        The brain MRI and MRA are reviewed in person. There is increased signal seen in 3 places involving the right midbrain region and the right frontal region. There is a larger lesion however involving the left frontal area extending to the genu  of these corpus callosum. There is moderate deep white matter and periventricular leukoencephalopathy. There is a small encephalomalacia involving the right basal ganglia consistent with a remote infarct. 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   Blood pressure (!) 116/93, pulse 86, temperature 98.1 F (36.7 C), temperature source Oral, resp. rate 15, height '5\' 6"'  (1.676 m), weight 136 lb 11.2 oz (62 kg), SpO2 94 %.  Past Medical History:  Diagnosis Date  . Cancer [facility])    prostate  . Renal disorder    kidney stones    Past Surgical History:  Procedure Laterality Date  . PROSTATE BIOPSY      History reviewed. No pertinent family history.  Social History:  reports that he has been smoking cigarettes.  He has been smoking about 1.00 pack per day. He has never used smokeless tobacco. He reports that he drank alcohol. He reports that he does not use drugs.  Allergies: No Known Allergies  Medications: Prior to Admission medications   Medication Sig Start Date End Date Taking? Authorizing Provider  aspirin EC 81 MG tablet Take 81 mg by mouth daily.   Yes [provider]    Scheduled Meds: . apixaban  10 mg Oral BID  . aspirin  300 mg Rectal Daily   Or  . aspirin  325 mg Oral Daily  . atorvastatin  40 mg Oral q1800  . carvedilol  3.125 mg Oral BID WC  .  furosemide  20 mg Intravenous BID  . guaiFENesin  1,200 mg Oral BID  . nicotine  21 mg Transdermal Daily   Continuous Infusions: . sodium chloride     PRN Meds:.acetaminophen **OR** acetaminophen (TYLENOL) oral liquid 160 mg/5 mL **OR** acetaminophen, ipratropium-albuterol, senna-docusate     Results for orders placed or performed during the hospital encounter of 12/24/17 (from the past 48 hour(s))  Hemoglobin A1c     Status: None   Collection Time: 12/25/17  5:48 AM  Result Value Ref Range   Hgb A1c MFr Bld 4.8 4.8 - 5.6 %    Comment: (NOTE) Pre diabetes:          5.7%-6.4% Diabetes:              >6.4% Glycemic control for   <7.0% adults with diabetes    Mean Plasma Glucose 91.06 mg/dL    Comment: Performed at [facility], [place] [address] [place] [address] [place], [place] [address]  Lipid panel     Status: Abnormal   Collection Time: 12/25/17  5:48 AM  Result Value Ref Range   Cholesterol 192 0 - 200 mg/dL   Triglycerides 139 <150 mg/dL   HDL 25 (L) >40 mg/dL   Total CHOL/HDL Ratio 7.7 RATIO   VLDL 28 0 - 40 mg/dL   LDL Cholesterol 139 (H) 0 - 99 mg/dL    Comment:        Total Cholesterol/HDL:CHD Risk Coronary Heart Disease Risk Table                     Men   Women  1/2 Average Risk   3.4   3.3  Average Risk       5.0   4.4  2 X Average Risk   9.6   7.1  3 X Average Risk  23.4   11.0        Use the calculated Patient Ratio above and the CHD Risk Table to determine the patient's CHD Risk.        ATP III CLASSIFICATION (LDL):  <100     mg/dL   Optimal  100-129  mg/dL   Near or Above                    Optimal  130-159  mg/dL   Borderline  160-189  mg/dL   High  >190     mg/dL   Very High Performed at [facility] [place], [place] [address]   Basic metabolic panel     Status: None   Collection Time: 12/25/17  5:48 AM  Result Value Ref Range   Sodium 142 135 - 145 mmol/L   Potassium 3.6 3.5 - 5.1 mmol/L   Chloride 106 101 - 111 mmol/L   CO2 27 22 - 32  mmol/L   Glucose, Bld 95 65 - 99 mg/dL   BUN 18 6 - 20 mg/dL   Creatinine, Ser  0.85 0.61 - 1.24 mg/dL   Calcium 8.9 8.9 - 10.3 mg/dL   GFR calc non Af Amer >60 >60 mL/min   GFR calc Af Amer >60 >60 mL/min    Comment: (NOTE) The eGFR has been calculated using the CKD EPI equation. This calculation has not been validated in all clinical situations. eGFR's persistently <60 mL/min signify possible Chronic Kidney Disease.    Anion gap 9 5 - 15    Comment: Performed at [facility], [address] [place], [place] [address]  Basic metabolic panel     Status: Abnormal   Collection Time: 12/26/17  5:43 AM  Result Value Ref Range   Sodium 141 135 - 145 mmol/L   Potassium 3.6 3.5 - 5.1 mmol/L   Chloride 105 101 - 111 mmol/L   CO2 27 22 - 32 mmol/L   Glucose, Bld 105 (H) 65 - 99 mg/dL   BUN 25 (H) 6 - 20 mg/dL   Creatinine, Ser 0.97 0.61 - 1.24 mg/dL   Calcium 9.3 8.9 - 10.3 mg/dL   GFR calc non Af Amer >60 >60 mL/min   GFR calc Af Amer >60 >60 mL/min    Comment: (NOTE) The eGFR has been calculated using the CKD EPI equation. This calculation has not been validated in all clinical situations. eGFR's persistently <60 mL/min signify possible Chronic Kidney Disease.    Anion gap 9 5 - 15    Comment: Performed at [facility], [address], [place], [place] [address]    Studies/Results:  CAROTID DOPPLERS  FINDINGS: Criteria: Quantification of carotid stenosis is based on velocity parameters that correlate the residual internal carotid diameter with NASCET-based stenosis levels, using the diameter of the distal internal carotid lumen as the denominator for stenosis measurement.  The following velocity measurements were obtained:  RIGHT ICA: 169 cm/sec CCA: 56 cm/sec  SYSTOLIC ICA/CCA RATIO:  3  ECA:  198 cm/sec  LEFT  ICA: 330 cm/sec  CCA: 64 cm/sec  SYSTOLIC ICA/CCA RATIO:  3.9  ECA:  229 cm/sec  RIGHT CAROTID ARTERY: Heterogeneous plaque at the right  carotid bulb and proximal internal carotid artery. External carotid artery is patent. Narrowing in the proximal internal carotid artery with a peak systolic velocity of 962 centimeters/second. Mid and distal internal carotid artery are patent.  RIGHT VERTEBRAL ARTERY: Antegrade flow and normal waveform in the right vertebral artery.  LEFT CAROTID ARTERY: Small amount of echogenic plaque at the left carotid bulb. Mildly elevated peak systolic velocity in the external carotid artery. Plaque in proximal internal carotid artery. Markedly elevated peak systolic velocity in the proximal internal carotid artery measuring 330 centimeters/second. Elevated peak systolic velocity in the mid internal carotid artery measuring 255 centimeters/second. Distal internal carotid artery is patent.  LEFT VERTEBRAL ARTERY: Not visualized. Reported to be occluded based on the recent MRA.  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  Estimated degree of stenosis in left internal carotid artery is greater than 70%.  Estimated degree of stenosis in the right internal carotid artery is 50-69%.  Patent right vertebral artery. Left vertebral artery not visualized and reported to be occluded based on recent MRA.     ECHO - Left ventricle: Distal septal / apical hypokinesis. The cavity   size was mildly dilated. Wall thickness was increased in a   pattern of mild LVH. Systolic function was mildly reduced. The   estimated ejection fraction was in the range of 45% to 50%.  Doppler parameters are consistent with abnormal left ventricular   relaxation (grade 1 diastolic dysfunction). - Aortic valve: There was mild regurgitation. Valve area (VTI):   1.61 cm^2. Valve area (Vmax): 1.67 cm^2. Valve area (Vmean): 1.66   cm^2. - Mitral valve: There was mild regurgitation. - Left atrium: The atrium was moderately dilated. - Atrial septum: No  defect or patent foramen ovale was identified. - Pulmonary arteries: PA peak pressure: 35 mm Hg (S).       BRAIN MRI MRA FINDINGS: MRI HEAD FINDINGS  Multiple sequences are up to moderately motion degraded.  Brain: There is a subcentimeter acute infarct in the posterior right pons. A 3 mm focus of trace diffusion hyperintensity in the right frontal lobe near the anterior aspect of the insula demonstrates normal ADC and may represent a subacute infarct. There is also a 1.6 cm focus of abnormal diffusion signal and T2 hyperintensity without reduced ADC in the left body of the corpus callosum with slight local mass effect. Chronic lacunar infarcts are noted in the right basal ganglia, right thalamus, and left cerebral hemispheric white matter. Patchy T2 hyperintensities elsewhere in the cerebral white matter and pons are nonspecific but compatible with mild chronic small vessel ischemic disease. There is mild cerebral atrophy.  Vascular: Partially abnormal distal left vertebral artery, more fully evaluated below. Other major intracranial vascular flow voids are preserved.  Skull and upper cervical spine: Unremarkable bone marrow signal.  Sinuses/Orbits: Bilateral cataract extraction. Paranasal sinuses and mastoid air cells are clear.  Other: None.  MRA HEAD FINDINGS  The study is mildly motion degraded.  The visualized distal right vertebral artery is widely patent. There is no flow related enhancement in the majority of the left V4 segment suggesting occlusion, possibly with minimal retrograde flow distally. AICA and SCA origins are grossly patent bilaterally. The basilar artery is widely patent. The PCAs are patent without evidence of significant proximal stenosis. Posterior communicating arteries are not identified and may be absent or diminutive.  The internal carotid arteries are patent from skull base to carotid termini. Focally diminished signal in the  distal right petrous ICA may be artifactual. There is the suggestion of mild right and possibly left paraclinoid stenosis versus artifact from motion and vessel orientation. The left cavernous ICA is slightly ectatic in appearance. A 2.5-3 mm funnel shaped outpouching from the left supraclinoid ICA in the posterior communicating region may represent an infundibulum or aneurysm. An associated vessel arising from this outpouching is not resolved on this MRA, however motion artifact limits assessment.  The right A1 segment is absent. The left A1 segment is widely patent. Both A2 segments are patent without evidence of significant stenosis. The MCAs are patent without evidence of proximal branch occlusion or M1 stenosis. Motion artifact limits assessment for MCA branch vessel stenoses.  IMPRESSION: 1. Small acute right pontine infarct. 2. 1.6 cm focus of diffusion abnormality in the left body of the corpus callosum with mild mass effect favored to represent a subacute infarct. A small neoplasm and demyelination are alternative considerations. Follow-up brain MRI without and with contrast is recommended in 6 weeks. 3. Possible punctate subacute right frontal infarct. 4. Mild chronic small vessel ischemic disease. 5. Motion degraded MRA without evidence of major branch occlusion or flow limiting proximal stenosis in the anterior circulation. Possible mild bilateral ICA stenoses. 6. Occluded distal left vertebral artery.         NECK CTA Aortic arch: 4.1 cm ascending aortic aneurysm. Severe predominantly fibrofatty plaque  of the aorta and proximal great vessels. Small outpouching of the brachiocephalic artery origin may represent penetrating ulcer or fibrofatty plaque ulceration (series 7, image 114). Second penetrating ulcer or fibrofatty plaque ulceration of the proximal left subclavian artery (series 7, image 138). Multiple segments of stenosis of the left subclavian  artery up to 50% moderate.  Right carotid system: Severe predominantly fibrofatty plaque with moderate 50% proximal ICA stenosis.  Left carotid system: Severe predominantly fibrofatty plaque of the carotid bifurcation with severe 70% proximal ICA stenosis.  Vertebral arteries: Right vertebral artery origin moderate 50-60% stenosis. Occlusion of left vertebral arteries V1, V2, and V3 segments.  Skeleton: Cervical spondylosis with degenerative changes greatest at the C5-C7 levels or uncovertebral and facet hypertrophy encroach on the neural foramen. Productive changes of the anterior C1-2 articulation. No high-grade bony canal stenosis.  Other neck: Subcarinal lymphadenopathy measuring 25 x 30 mm (series 4, image 5). Calcifications within lower paratracheal lymph nodes.  Upper chest: Severe emphysema.  IMPRESSION: 1. 4.1 cm ascending aortic aneurysm. Recommend annual imaging followup by CTA or MRA. This recommendation follows 2010 ACCF/AHA/AATS/ACR/ASA/SCA/SCAI/SIR/STS/SVM Guidelines for the Diagnosis and Management of Patients with Thoracic Aortic Disease. Circulation. 2010; 121: K383-K184. 2. Severe predominantly fibrofatty plaque of the aorta, great vessels, and carotid bifurcations. 3. Penetrating ulcer/fibrofatty plaque ulceration of the brachiocephalic artery origin and proximal left subclavian artery. 4. Right proximal ICA moderate 50% stenosis. 5. Left proximal ICA severe 70% stenosis. 6. Right vertebral origin moderate 50-60% stenosis. 7. Occluded left vertebral artery V1, V2, and V3 segments. Patent left V4 segment. 8. Severe emphysema. 9. Subcarinal lymphadenopathy which may be due to granulomatous disease given calcified lower paratracheal lymph nodes. Lymphoproliferative disorder or metastasis is           Paradise Vensel A. Merlene Laughter, M.D.  Diplomate, Tax adviser of Psychiatry and Neurology ( Neurology). 12/26/2017, 5:39 PM

## 2017-12-26 NOTE — Progress Notes (Signed)
   Daily Progress Note  Case discussed with Dr. Roderic Palau.  Pt with new acute B CVA.  B carotid duplex: R ICA >50%.  L ICA >70%.  We discussed briefly options for work-up and we decided to see patient as outpatient in 2 weeks.   While admitted would get a CTA Neck to evaluate bilateral internal carotid artery and common carotid artery anatomy in the event pt will need a TCAR (transcarotid artery revascularization) for medical risks.  Adele Barthel, MD, FACS [facility] Office: [phone] Pager: [phone]  12/26/2017, 12:30 PM

## 2017-12-31 ENCOUNTER — Encounter (INDEPENDENT_AMBULATORY_CARE_PROVIDER_SITE_OTHER): Payer: Medicare Other

## 2017-12-31 DIAGNOSIS — I639 Cerebral infarction, unspecified: Secondary | ICD-10-CM

## 2018-01-07 ENCOUNTER — Ambulatory Visit (INDEPENDENT_AMBULATORY_CARE_PROVIDER_SITE_OTHER): Payer: Medicare Other | Admitting: Cardiology

## 2018-01-07 ENCOUNTER — Encounter: Payer: Self-pay | Admitting: Cardiology

## 2018-01-07 ENCOUNTER — Encounter: Payer: Self-pay | Admitting: *Deleted

## 2018-01-07 ENCOUNTER — Telehealth: Payer: Self-pay | Admitting: Cardiology

## 2018-01-07 VITALS — BP 148/84 | HR 64 | Ht 66.0 in | Wt 145.2 lb

## 2018-01-07 DIAGNOSIS — I5022 Chronic systolic (congestive) heart failure: Secondary | ICD-10-CM | POA: Diagnosis not present

## 2018-01-07 DIAGNOSIS — I10 Essential (primary) hypertension: Secondary | ICD-10-CM

## 2018-01-07 DIAGNOSIS — E782 Mixed hyperlipidemia: Secondary | ICD-10-CM

## 2018-01-07 DIAGNOSIS — I6523 Occlusion and stenosis of bilateral carotid arteries: Secondary | ICD-10-CM | POA: Diagnosis not present

## 2018-01-07 DIAGNOSIS — R0602 Shortness of breath: Secondary | ICD-10-CM

## 2018-01-07 MED ORDER — LISINOPRIL 5 MG PO TABS: 5.0000 mg | ORAL_TABLET | Freq: Every day | ORAL | 6 refills | Status: DC

## 2018-01-07 MED ORDER — APIXABAN 5 MG PO TABS: 5.0000 mg | ORAL_TABLET | Freq: Two times a day (BID) | ORAL | 0 refills | Status: DC

## 2018-01-07 NOTE — Progress Notes (Signed)
Clinical Summary Mr. Uptain is a 83 y.o.male seen as new consult, referred by Dr Edrick Oh for SOB.   1. Chronic systolic HF - echo LVEF 25-05%, grade I diastolic dysfunction.  - had some volume overload during recent 12/2017 admission.   - no recent edema. Chronic SOB/DOE. Family reports SOB/DOE just walking in from parking lot - no chest pain   2. CVA - admit 12/2017 with CVA. Bilateral infarcts on MRI suggesting embolic event.  - was to have a 30 day event monitor - neurology has recommended DAPT. Vascuarl recommended anticoagulation due to CTA showing fatty plaque of aorta with possible thomrbus  3. Hyperlipidemia - started on statin during recent admission  4. Carotid stenosis - followed by vascualr. CTA with right 50% ICA, left 70% ICA.  - carotid US with LICA >39%, RICA 76-73%  5. Aortic aneurysm - noted on CT 4.1 cm 12/2017  6. COPD - severe emphysema by CT - some cough at times. NO wheezing.   Past Medical History:  Diagnosis Date  . Cancer [facility])    prostate  . Renal disorder    kidney stones     No Known Allergies   Current Outpatient Medications  Medication Sig Dispense Refill  . albuterol (PROVENTIL HFA;VENTOLIN HFA) 108 (90 Base) MCG/ACT inhaler Inhale 2 puffs into the lungs every 6 (six) hours as needed for wheezing or shortness of breath. 1 Inhaler 2  . apixaban (ELIQUIS) 5 MG TABS tablet Take 10mg  po bid for 7 days then 5mg  po bid 70 tablet 2  . aspirin EC 81 MG tablet Take 81 mg by mouth daily.    Marland Kitchen atorvastatin (LIPITOR) 40 MG tablet Take 1 tablet (40 mg total) by mouth daily at 6 PM. 30 tablet 1  . carvedilol (COREG) 6.25 MG tablet Take 1 tablet (6.25 mg total) by mouth 2 (two) times daily with a meal. 60 tablet 1  . furosemide (LASIX) 40 MG tablet Take 1 tablet (40 mg total) by mouth daily. 30 tablet 11  . guaiFENesin (MUCINEX) 600 MG 12 hr tablet Take 2 tablets (1,200 mg total) by mouth 2 (two) times daily. 60 tablet 0  . potassium chloride 20  MEQ TBCR Take 20 mEq by mouth daily. 30 tablet 1  . tiotropium (SPIRIVA HANDIHALER) 18 MCG inhalation capsule Place 1 capsule (18 mcg total) into inhaler and inhale daily. 30 capsule 2   No current facility-administered medications for this visit.      Past Surgical History:  Procedure Laterality Date  . PROSTATE BIOPSY       No Known Allergies     Family History  Problem Relation Age of Onset  . Cancer Sister   . Cancer Sister   . Heart attack Brother   . Heart disease Brother     Social History Mr. Park reports that he has been smoking cigarettes.  He has been smoking about 1.00 pack per day. He has never used smokeless tobacco. Mr. Llanas reports that he drank alcohol.   Review of Systems CONSTITUTIONAL: No weight loss, fever, chills, weakness or fatigue.  HEENT: Eyes: No visual loss, blurred vision, double vision or yellow sclerae.No hearing loss, sneezing, congestion, runny nose or sore throat.  SKIN: No rash or itching.  CARDIOVASCULAR: per hpi RESPIRATORY:per hpi GASTROINTESTINAL: No anorexia, nausea, vomiting or diarrhea. No abdominal pain or blood.  GENITOURINARY: No burning on urination, no polyuria NEUROLOGICAL: No headache, dizziness, syncope, paralysis, ataxia, numbness or tingling in the extremities. No  change in bowel or bladder control.  MUSCULOSKELETAL: No muscle, back pain, joint pain or stiffness.  LYMPHATICS: No enlarged nodes. No history of splenectomy.  PSYCHIATRIC: No history of depression or anxiety.  ENDOCRINOLOGIC: No reports of sweating, cold or heat intolerance. No polyuria or polydipsia.  Marland Kitchen   Physical Examination Vitals:   01/07/18 0847 01/07/18 0854  BP: 139/72 (!) 148/84  Pulse: 62 64  SpO2: 95% 94%   Vitals:   01/07/18 0847  Weight: 145 lb 3.2 oz (65.9 kg)  Height: 5\' 6"  (1.676 m)    Gen: resting comfortably, no acute distress HEENT: no scleral icterus, pupils equal round and reactive, no palptable cervical adenopathy,    CV: RRR, no m/r/g, no jvd Resp: mild wheezing GI: abdomen is soft, non-tender, non-distended, normal bowel sounds, no hepatosplenomegaly MSK: extremities are warm, no edema.  Skin: warm, no rash Neuro:  no focal deficits Psych: appropriate affect   Diagnostic Studies 12/2017 echo Study Conclusions  - Left ventricle: Distal septal / apical hypokinesis. The cavity   size was mildly dilated. Wall thickness was increased in a   pattern of mild LVH. Systolic function was mildly reduced. The   estimated ejection fraction was in the range of 45% to 50%.   Doppler parameters are consistent with abnormal left ventricular   relaxation (grade 1 diastolic dysfunction). - Aortic valve: There was mild regurgitation. Valve area (VTI):   1.61 cm^2. Valve area (Vmax): 1.67 cm^2. Valve area (Vmean): 1.66   cm^2. - Mitral valve: There was mild regurgitation. - Left atrium: The atrium was moderately dilated. - Atrial septum: No defect or patent foramen ovale was identified. - Pulmonary arteries: PA peak pressure: 35 mm Hg (S).   12/2017 CTA neck IMPRESSION: 1. 4.1 cm ascending aortic aneurysm. Recommend annual imaging followup by CTA or MRA. This recommendation follows 2010 ACCF/AHA/AATS/ACR/ASA/SCA/SCAI/SIR/STS/SVM Guidelines for the Diagnosis and Management of Patients with Thoracic Aortic Disease. Circulation. 2010; 121: L244-W102. 2. Severe predominantly fibrofatty plaque of the aorta, great vessels, and carotid bifurcations. 3. Penetrating ulcer/fibrofatty plaque ulceration of the brachiocephalic artery origin and proximal left subclavian artery. 4. Right proximal ICA moderate 50% stenosis. 5. Left proximal ICA severe 70% stenosis. 6. Right vertebral origin moderate 50-60% stenosis. 7. Occluded left vertebral artery V1, V2, and V3 segments. Patent left V4 segment. 8. Severe emphysema. 9. Subcarinal lymphadenopathy which may be due to granulomatous disease given calcified lower  paratracheal lymph nodes. Lymphoproliferative disorder or metastasis is also possible.  Assessment and Plan  1. Chronic systolic HF - mild LV dysfunction. Appears euvolemic. In setting of mild systolic dysfunction and vascular disease/CVA start lisinopril 5mg  daily, check BMET/Mg in 1 week  2. Hyperlipidemia - continue statin  3. SOB - with ongoing symptoms and mild LV dysfunction, will obtain exercise nuclear stress test to evaluate for underlying ischemia.   F/u 4 months    Arnoldo Lenis, M.D.

## 2018-01-07 NOTE — Telephone Encounter (Signed)
Pre-cert Verification for the following procedure   Exercise stress scheduled for 01-08-18 at [facility]

## 2018-01-07 NOTE — Patient Instructions (Addendum)
Medication Instructions:   Begin Lisinopril 5mg  daily.   Eliquis samples provided today.   Continue all other medications.    Labwork:  BMET, Magnesium - orders given today.   Do in 1 week.   Office will contact with results via phone or letter.    Testing/Procedures:  Your physician has requested that you have an exercise stress myoview. For further information please visit [email or web address]. Please follow instruction sheet, as given.  Office will contact with results via phone or letter.    Follow-Up: 4 months   Any Other Special Instructions Will Be Listed Below (If Applicable).  If you need a refill on your cardiac medications before your next appointment, please call your pharmacy.

## 2018-01-08 ENCOUNTER — Encounter (HOSPITAL_COMMUNITY): Payer: Self-pay

## 2018-01-08 ENCOUNTER — Ambulatory Visit (HOSPITAL_COMMUNITY)
Admission: RE | Admit: 2018-01-08 | Discharge: 2018-01-08 | Disposition: A | Discharge status: Home / Self Care | Payer: Medicare Other | Source: Ambulatory Visit | Attending: Cardiology | Admitting: Cardiology

## 2018-01-08 ENCOUNTER — Encounter (HOSPITAL_COMMUNITY)
Admission: RE | Admit: 2018-01-08 | Discharge: 2018-01-08 | Disposition: A | Discharge status: Home / Self Care | Payer: Medicare Other | Source: Ambulatory Visit | Attending: Cardiology | Admitting: Cardiology

## 2018-01-08 DIAGNOSIS — I519 Heart disease, unspecified: Secondary | ICD-10-CM | POA: Insufficient documentation

## 2018-01-08 DIAGNOSIS — R0602 Shortness of breath: Secondary | ICD-10-CM | POA: Insufficient documentation

## 2018-01-08 LAB — NM MYOCAR MULTI W/SPECT W/WALL MOTION / EF
CHL CUP NUCLEAR SDS: 8
CHL CUP NUCLEAR SRS: 14
CHL CUP NUCLEAR SSS: 20
CSEPED: 1 min
CSEPEDS: 23 s
CSEPEW: 4.6 METS
CSEPHR: 79 %
CSEPPHR: 111 {beats}/min
LV dias vol: 150 mL (ref 62–150)
LV sys vol: 87 mL
MPHR: 139 {beats}/min
RATE: 0.34
Rest HR: 83 {beats}/min
TID: 1.23

## 2018-01-08 MED ORDER — TECHNETIUM TC 99M TETROFOSMIN IV KIT
30.0000 | PACK | Freq: Once | INTRAVENOUS | Status: AC | PRN
  Administered 2018-01-08: 31 via INTRAVENOUS

## 2018-01-08 MED ORDER — SODIUM CHLORIDE 0.9% FLUSH
INTRAVENOUS | Status: AC
  Administered 2018-01-08: 10 mL via INTRAVENOUS
  Filled 2018-01-08: qty 10

## 2018-01-08 MED ORDER — TECHNETIUM TC 99M TETROFOSMIN IV KIT
10.0000 | PACK | Freq: Once | INTRAVENOUS | Status: AC | PRN
  Administered 2018-01-08: 9.16 via INTRAVENOUS

## 2018-01-08 MED ORDER — REGADENOSON 0.4 MG/5ML IV SOLN
INTRAVENOUS | Status: AC
  Administered 2018-01-08: 0.4 mg via INTRAVENOUS
  Filled 2018-01-08: qty 5

## 2018-01-13 ENCOUNTER — Encounter: Payer: Self-pay | Admitting: Cardiology

## 2018-02-02 ENCOUNTER — Other Ambulatory Visit (HOSPITAL_COMMUNITY)
Admission: RE | Admit: 2018-02-02 | Discharge: 2018-02-02 | Disposition: A | Discharge status: Home / Self Care | Payer: Medicare Other | Source: Ambulatory Visit | Attending: Student | Admitting: Student

## 2018-02-02 ENCOUNTER — Ambulatory Visit (INDEPENDENT_AMBULATORY_CARE_PROVIDER_SITE_OTHER): Payer: Medicare Other | Admitting: Student

## 2018-02-02 ENCOUNTER — Encounter: Payer: Self-pay | Admitting: Student

## 2018-02-02 VITALS — BP 120/64 | HR 84 | Ht 67.0 in | Wt 142.0 lb

## 2018-02-02 DIAGNOSIS — R0609 Other forms of dyspnea: Secondary | ICD-10-CM

## 2018-02-02 DIAGNOSIS — I6523 Occlusion and stenosis of bilateral carotid arteries: Secondary | ICD-10-CM

## 2018-02-02 DIAGNOSIS — E782 Mixed hyperlipidemia: Secondary | ICD-10-CM

## 2018-02-02 DIAGNOSIS — I639 Cerebral infarction, unspecified: Secondary | ICD-10-CM

## 2018-02-02 DIAGNOSIS — Z8673 Personal history of transient ischemic attack (TIA), and cerebral infarction without residual deficits: Secondary | ICD-10-CM | POA: Diagnosis not present

## 2018-02-02 DIAGNOSIS — I10 Essential (primary) hypertension: Secondary | ICD-10-CM | POA: Diagnosis not present

## 2018-02-02 DIAGNOSIS — R9439 Abnormal result of other cardiovascular function study: Secondary | ICD-10-CM | POA: Diagnosis not present

## 2018-02-02 DIAGNOSIS — I5042 Chronic combined systolic (congestive) and diastolic (congestive) heart failure: Secondary | ICD-10-CM

## 2018-02-02 LAB — CBC WITH DIFFERENTIAL/PLATELET
Basophils Absolute: 0 10*3/uL (ref 0.0–0.1)
Basophils Relative: 0 %
Eosinophils Absolute: 0.4 10*3/uL (ref 0.0–0.7)
Eosinophils Relative: 5 %
HEMATOCRIT: 47.6 % (ref 39.0–52.0)
HEMOGLOBIN: 16.1 g/dL (ref 13.0–17.0)
LYMPHS PCT: 35 %
Lymphs Abs: 3 10*3/uL (ref 0.7–4.0)
MCH: 30.7 pg (ref 26.0–34.0)
MCHC: 33.8 g/dL (ref 30.0–36.0)
MCV: 90.8 fL (ref 78.0–100.0)
MONO ABS: 0.5 10*3/uL (ref 0.1–1.0)
MONOS PCT: 6 %
NEUTROS ABS: 4.6 10*3/uL (ref 1.7–7.7)
NEUTROS PCT: 54 %
Platelets: 104 10*3/uL — ABNORMAL LOW (ref 150–400)
RBC: 5.24 MIL/uL (ref 4.22–5.81)
RDW: 14.4 % (ref 11.5–15.5)
WBC: 8.5 10*3/uL (ref 4.0–10.5)

## 2018-02-02 LAB — BASIC METABOLIC PANEL
ANION GAP: 9 (ref 5–15)
BUN: 28 mg/dL — ABNORMAL HIGH (ref 8–23)
CALCIUM: 9.2 mg/dL (ref 8.9–10.3)
CHLORIDE: 106 mmol/L (ref 98–111)
CO2: 27 mmol/L (ref 22–32)
Creatinine, Ser: 1.25 mg/dL — ABNORMAL HIGH (ref 0.61–1.24)
GFR calc non Af Amer: 52 mL/min — ABNORMAL LOW (ref >60)
GLUCOSE: 95 mg/dL (ref 70–99)
POTASSIUM: 4.2 mmol/L (ref 3.5–5.1)
Sodium: 142 mmol/L (ref 135–145)

## 2018-02-02 NOTE — Patient Instructions (Signed)
Medication Instructions:  Your physician recommends that you continue on your current medications as directed. Please refer to the Current Medication list given to you today.   Labwork: TODAY   Testing/Procedures: Your physician has requested that you have a cardiac catheterization. Cardiac catheterization is used to diagnose and/or treat various heart conditions. Doctors may recommend this procedure for a number of different reasons. The most common reason is to evaluate chest pain. Chest pain can be a symptom of coronary artery disease (CAD), and cardiac catheterization can show whether plaque is narrowing or blocking your heart's arteries. This procedure is also used to evaluate the valves, as well as measure the blood flow and oxygen levels in different parts of your heart. For further information please visit [facility]uge[facility]iest[facility][email or web address]. Please follow instruction sheet, as given.    Follow-Up: Your physician recommends that you schedule a follow-up appointment in: 4 WEEKS    Any Other Special Instructions Will Be Listed Below (If Applicable).     If you need a refill on your cardiac medications before your next appointment, please call your pharmacy.   [place] [address] Dept: [phone] Loc: [phone]  MOUSA PROUT  02/02/2018  You are scheduled for a Cardiac Catheterization on Tuesday, July 9 with Dr. Daneen Schick.  1. Please arrive at the [facility] (Main Entrance A) at [facility]: [place], [place] [address] at 10:00 AM (two hours before your procedure to ensure your preparation). Free valet parking service is available.   Special note: Every effort is made to have your procedure done on time. Please understand that emergencies sometimes delay scheduled procedures.  2. Diet: Do not eat or drink anything after midnight prior to your procedure except  sips of water to take medications.  3. Labs:TODAY   4. Medication instructions in preparation for your procedure:   DO NOT TAKE ELIQUIS ON Sunday, Monday or Tuesday  DO NOT TAKE LASIX THE DAY OF PROCEDURE    On the morning of your procedure, take your Aspirin and any morning medicines NOT listed above.  You may use sips of water.  5. Plan for one night stay--bring personal belongings. 6. Bring a current list of your medications and current insurance cards. 7. You MUST have a responsible person to drive you home. 8. Someone MUST be with you the first 24 hours after you arrive home or your discharge will be delayed. 9. Please wear clothes that are easy to get on and off and wear slip-on shoes.  Thank you for allowing [place] to care for you!   -- [facility]

## 2018-02-02 NOTE — H&P (View-Only) (Signed)
Cardiology Office Note    Date:  02/02/2018   ID:  Daniel Simon, DOB September 18, 1934, MRN [id number]  PCP:  Dione Housekeeper, MD  Cardiologist: Carlyle Dolly, MD    Chief Complaint  Patient presents with  . Follow-up    recent abnormal NST    History of Present Illness:    Daniel Simon is a 83 y.o. male with past medical history of chronic combined systolic and diastolic CHF (EF 96-29% by echo in 12/2017), HTN, HLD, carotid artery stenosis, AAA (4.1cm by imaging in 12/2017), COPD, and recent CVA (on 12/2017) who presents to the office today for follow-up of recent testing.   He was examined by Dr. Harl Bowie on 01/07/2018 for follow-up from his recent hospitalization for a CVA during which time he was found to have a reduced EF of 45 to 50%. He reported chronic dyspnea on exertion but denied any acute changes in his symptoms. Denied any recent chest discomfort. He was continued on Carvedilol and Lisinopril 5 mg daily was added to his medication regimen. Due to his dyspnea and LV dysfunction, an NST was recommended for ischemic evaluation. This was performed on 01/08/2018 and showed a medium size, severe intensity, periapical defect which was partially really reversible and consistent with an infarct scar and mild peri-infarct ischemia. This was interpreted as an intermediate risk study and he was started on Amlodipine 2.5mg  daily for anti-anginal benefit.   In talking with the patient, his wife, and his 2 children today, he reports continuing to have significant dyspnea on exertion. His wife reports he gets short of breath when walking from one end of the house to the other and this is new over the past several months. He also reports significant fatigue. Denies any recent chest discomfort or palpitations. No recent orthopnea, PND, or lower extremity edema.  He has progressed well from his stroke in 12/2017 and denies any neurological deficits. He has not been checking blood pressure regularly  at home but it is well controlled at 120/64 during today's visit.   Past Medical History:  Diagnosis Date  . Cancer [facility])    prostate  . Renal disorder    kidney stones    Past Surgical History:  Procedure Laterality Date  . PROSTATE BIOPSY      Current Medications: Outpatient Medications Prior to Visit  Medication Sig Dispense Refill  . albuterol (PROVENTIL HFA;VENTOLIN HFA) 108 (90 Base) MCG/ACT inhaler Inhale 2 puffs into the lungs every 6 (six) hours as needed for wheezing or shortness of breath. 1 Inhaler 2  . apixaban (ELIQUIS) 5 MG TABS tablet Take 1 tablet (5 mg total) by mouth 2 (two) times daily. 28 tablet 0  . aspirin EC 81 MG tablet Take 81 mg by mouth daily.    Marland Kitchen atorvastatin (LIPITOR) 40 MG tablet Take 1 tablet (40 mg total) by mouth daily at 6 PM. 30 tablet 1  . carvedilol (COREG) 6.25 MG tablet Take 1 tablet (6.25 mg total) by mouth 2 (two) times daily with a meal. 60 tablet 1  . furosemide (LASIX) 40 MG tablet Take 1 tablet (40 mg total) by mouth daily. 30 tablet 11  . lisinopril (PRINIVIL,ZESTRIL) 5 MG tablet Take 1 tablet (5 mg total) by mouth daily. 30 tablet 6  . potassium chloride 20 MEQ TBCR Take 20 mEq by mouth daily. 30 tablet 1  . tiotropium (SPIRIVA HANDIHALER) 18 MCG inhalation capsule Place 1 capsule (18 mcg total) into inhaler and inhale daily. 30 capsule  2   No facility-administered medications prior to visit.      Allergies:   Patient has no known allergies.   Social History   Socioeconomic History  . Marital status: Married    Spouse name: Not on file  . Number of children: Not on file  . Years of education: Not on file  . Highest education level: Not on file  Occupational History  . Not on file  Social Needs  . Financial resource strain: Not on file  . Food insecurity:    Worry: Not on file    Inability: Not on file  . Transportation needs:    Medical: Not on file    Non-medical: Not on file  Tobacco Use  . Smoking status: Current  Every Day Smoker    Packs/day: 1.00    Types: Cigarettes    Start date: 09/24/1948  . Smokeless tobacco: Never Used  Substance and Sexual Activity  . Alcohol use: Not Currently    Frequency: Never  . Drug use: Never  . Sexual activity: Not Currently  Lifestyle  . Physical activity:    Days per week: 0 days    Minutes per session: 0 min  . Stress: Only a little  Relationships  . Social connections:    Talks on phone: Not on file    Gets together: Not on file    Attends religious service: Not on file    Active member of club or organization: Not on file    Attends meetings of clubs or organizations: Not on file    Relationship status: Not on file  Other Topics Concern  . Not on file  Social History Narrative  . Not on file     Family History:  The patient's family history includes Cancer in his sister and sister; Heart attack in his brother; Heart disease in his brother.   Review of Systems:   Please see the history of present illness.     General:  No chills, fever, night sweats or weight changes.  Cardiovascular:  No chest pain, edema, orthopnea, palpitations, paroxysmal nocturnal dyspnea. Positive for dyspnea on exertion and fatigue.  Dermatological: No rash, lesions/masses Respiratory: No cough, dyspnea Urologic: No hematuria, dysuria Abdominal:   No nausea, vomiting, diarrhea, bright red blood per rectum, melena, or hematemesis Neurologic:  No visual changes, wkns, changes in mental status. All other systems reviewed and are otherwise negative except as noted above.   Physical Exam:    VS:  BP 120/64 (BP Location: Left Arm)   Pulse 84   Ht 5\' 7"  (1.702 m)   Wt 142 lb (64.4 kg)   SpO2 91%   BMI 22.24 kg/m    General: Well developed, elderly Caucasian male appearing in no acute distress. Head: Normocephalic, atraumatic, sclera non-icteric, no xanthomas, nares are without discharge.  Neck: No carotid bruits. JVD not elevated.  Lungs: Respirations regular and  unlabored, without wheezes or rales.  Heart: Regular rate and rhythm. No S3 or S4.  No murmur, no rubs, or gallops appreciated. Abdomen: Soft, non-tender, non-distended with normoactive bowel sounds. No hepatomegaly. No rebound/guarding. No obvious abdominal masses. Msk:  Strength and tone appear normal for age. No joint deformities or effusions. Extremities: No clubbing or cyanosis. No lower extremity edema.  Distal pedal pulses are 2+ bilaterally. Neuro: Alert and oriented X 3. Moves all extremities spontaneously. No focal deficits noted. Psych:  Responds to questions appropriately with a normal affect. Skin: No rashes or lesions noted  Wt  Readings from Last 3 Encounters:  02/02/18 142 lb (64.4 kg)  01/07/18 145 lb 3.2 oz (65.9 kg)  12/26/17 136 lb 11.2 oz (62 kg)     Studies/Labs Reviewed:   EKG:  EKG is not ordered today.  EKG from recent NST is reviewed which shows NSR, HR 76, with diffuse TWI.   Recent Labs: 12/24/2017: ALT 10; B Natriuretic Peptide 509.0 02/02/2018: BUN 28; Creatinine, Ser 1.25; Hemoglobin 16.1; Platelets 104; Potassium 4.2; Sodium 142   Lipid Panel    Component Value Date/Time   CHOL 192 12/25/2017 0548   TRIG 139 12/25/2017 0548   HDL 25 (L) 12/25/2017 0548   CHOLHDL 7.7 12/25/2017 0548   VLDL 28 12/25/2017 0548   LDLCALC 139 (H) 12/25/2017 0548    Additional studies/ records that were reviewed today include:   Echocardiogram: 12/2017 Study Conclusions  - Left ventricle: Distal septal / apical hypokinesis. The cavity   size was mildly dilated. Wall thickness was increased in a   pattern of mild LVH. Systolic function was mildly reduced. The   estimated ejection fraction was in the range of 45% to 50%.   Doppler parameters are consistent with abnormal left ventricular   relaxation (grade 1 diastolic dysfunction). - Aortic valve: There was mild regurgitation. Valve area (VTI):   1.61 cm^2. Valve area (Vmax): 1.67 cm^2. Valve area (Vmean): 1.66    cm^2. - Mitral valve: There was mild regurgitation. - Left atrium: The atrium was moderately dilated. - Atrial septum: No defect or patent foramen ovale was identified. - Pulmonary arteries: PA peak pressure: 35 mm Hg (S).    Assessment:    1. Abnormal stress test   2. DOE (dyspnea on exertion)   3. Chronic combined systolic and diastolic heart failure ([place])   4. Essential hypertension   5. Mixed hyperlipidemia   6. Carotid stenosis, bilateral   7. Recent cerebrovascular accident (CVA)      Plan:   In order of problems listed above:  1. Abnormal Stress Test/[place]ys[place] on Exertion - The patient has reported a history of progressive dyspnea on exertion and fatigue over the past several months.  During his recent admission, an echocardiogram showed his EF was mildly reduced and an outpatient NST was obtained which showed a medium size, severe intensity, periapical defect which was partially really reversible and consistent with an infarct scar and mild peri-infarct ischemia. This was interpreted as an intermediate risk study. - He reports continual dyspnea on exertion and believes symptoms have slightly worsened over the past several weeks. We reviewed options including continual medical management versus a cardiac catheterization for definitive evaluation and the patient along with his family wish to proceed with a catheterization. This was reviewed with Dr. Domenic Polite who was in the office today. Will plan for a catheterization next week with instructions to hold Eliquis 48 hours prior to the procedure.  - The patient understands that risks include but are not limited to stroke (1 in 1000), death (1 in 61), kidney failure [usually temporary] (1 in 500), bleeding (1 in 200), allergic reaction [possibly serious] (1 in 200). Obtain CBC and BMET today. EKG was performed at the time of his recent NST and is available for review in Epic.  - continue ASA, BB, and statin therapy.   2. Chronic  Combined Systolic and Diastolic CHF - Recent echocardiogram showed his EF was mildly reduced at 45 to 50%.  He has chronic dyspnea on exertion which has acutely worsened over the past several  months but denies any recent orthopnea, PND, or lower extremity edema. Appears euvolemic by examination today. - Continue Lasix 40 mg daily along with Carvedilol and Lisinopril. Will recheck a BMET today  3. HTN - BP is well controlled at 120/64 during today's visit. Remains on Carvedilol 6.25 mg twice daily and Lisinopril 5 mg daily.  4. HLD - FLP in 12/2017 showed total cholesterol 192, HDL 25, and LDL 139. He has been started on Atorvastatin 40 mg daily. Will need repeat FLP and LFT's in 6 weeks.   5. Carotid Artery Stenosis - Carotid Dopplers in 14/7829 showed LICA stenosis greater than 70% and RICA stenosis at 50-69%.  - Scheduled to follow-up with vascular surgery later this month.  It appears he was also started on Eliquis during his recent hospitalization at the recommendations of Dr. Bridgett Larsson. Will defer continuing anticoagulation to Vascular Surgery and route today's note to Dr. Bridgett Larsson.    6. Recent CVA - Denies any residual neurological deficits. Followed by Neurology in the outpatient setting. His recent event monitor has not been officially resulted but no critical events were sent to the office during this timeframe. Remains on ASA and statin therapy.   Medication Adjustments/Labs and Tests Ordered: Current medicines are reviewed at length with the patient today.  Concerns regarding medicines are outlined above.  Medication changes, Labs and Tests ordered today are listed in the Patient Instructions below. Patient Instructions  Medication Instructions:  Your physician recommends that you continue on your current medications as directed. Please refer to the Current Medication list given to you today.  Labwork: TODAY   Testing/Procedures: Your physician has requested that you have a cardiac  catheterization. Cardiac catheterization is used to diagnose and/or treat various heart conditions. Doctors may recommend this procedure for a number of different reasons. The most common reason is to evaluate chest pain. Chest pain can be a symptom of coronary artery disease (CAD), and cardiac catheterization can show whether plaque is narrowing or blocking your heart's arteries. This procedure is also used to evaluate the valves, as well as measure the blood flow and oxygen levels in different parts of your heart. For further information please visit HugeFiesta[facility][email or web address]. Please follow instruction sheet, as given.  Follow-Up: Your physician recommends that you schedule a follow-up appointment in: 4 WEEKS   Any Other Special Instructions Will Be Listed Below (If Applicable).  If you need a refill on your cardiac medications before your next appointment, please call your pharmacy.    Signed, Daniel Heritage, PA-C  02/02/2018 7:02 PM    Daniel S. [address] [place], [place] [address] Phone: [phone]

## 2018-02-02 NOTE — Progress Notes (Signed)
Cardiology Office Note    Date:  02/02/2018   ID:  Daniel Simon, DOB 20-Aug-1934, MRN [id number]  PCP:  Dione Housekeeper, MD  Cardiologist: Carlyle Dolly, MD    Chief Complaint  Patient presents with  . Follow-up    recent abnormal NST    History of Present Illness:    Daniel Simon is a 83 y.o. male with past medical history of chronic combined systolic and diastolic CHF (EF 73-71% by echo in 12/2017), HTN, HLD, carotid artery stenosis, AAA (4.1cm by imaging in 12/2017), COPD, and recent CVA (on 12/2017) who presents to the office today for follow-up of recent testing.   He was examined by Dr. Harl Bowie on 01/07/2018 for follow-up from his recent hospitalization for a CVA during which time he was found to have a reduced EF of 45 to 50%. He reported chronic dyspnea on exertion but denied any acute changes in his symptoms. Denied any recent chest discomfort. He was continued on Carvedilol and Lisinopril 5 mg daily was added to his medication regimen. Due to his dyspnea and LV dysfunction, an NST was recommended for ischemic evaluation. This was performed on 01/08/2018 and showed a medium size, severe intensity, periapical defect which was partially really reversible and consistent with an infarct scar and mild peri-infarct ischemia. This was interpreted as an intermediate risk study and he was started on Amlodipine 2.5mg  daily for anti-anginal benefit.   In talking with the patient, his wife, and his 2 children today, he reports continuing to have significant dyspnea on exertion. His wife reports he gets short of breath when walking from one end of the house to the other and this is new over the past several months. He also reports significant fatigue. Denies any recent chest discomfort or palpitations. No recent orthopnea, PND, or lower extremity edema.  He has progressed well from his stroke in 12/2017 and denies any neurological deficits. He has not been checking blood pressure regularly  at home but it is well controlled at 120/64 during today's visit.   Past Medical History:  Diagnosis Date  . Cancer [facility])    prostate  . Renal disorder    kidney stones    Past Surgical History:  Procedure Laterality Date  . PROSTATE BIOPSY      Current Medications: Outpatient Medications Prior to Visit  Medication Sig Dispense Refill  . albuterol (PROVENTIL HFA;VENTOLIN HFA) 108 (90 Base) MCG/ACT inhaler Inhale 2 puffs into the lungs every 6 (six) hours as needed for wheezing or shortness of breath. 1 Inhaler 2  . apixaban (ELIQUIS) 5 MG TABS tablet Take 1 tablet (5 mg total) by mouth 2 (two) times daily. 28 tablet 0  . aspirin EC 81 MG tablet Take 81 mg by mouth daily.    Marland Kitchen atorvastatin (LIPITOR) 40 MG tablet Take 1 tablet (40 mg total) by mouth daily at 6 PM. 30 tablet 1  . carvedilol (COREG) 6.25 MG tablet Take 1 tablet (6.25 mg total) by mouth 2 (two) times daily with a meal. 60 tablet 1  . furosemide (LASIX) 40 MG tablet Take 1 tablet (40 mg total) by mouth daily. 30 tablet 11  . lisinopril (PRINIVIL,ZESTRIL) 5 MG tablet Take 1 tablet (5 mg total) by mouth daily. 30 tablet 6  . potassium chloride 20 MEQ TBCR Take 20 mEq by mouth daily. 30 tablet 1  . tiotropium (SPIRIVA HANDIHALER) 18 MCG inhalation capsule Place 1 capsule (18 mcg total) into inhaler and inhale daily. 30 capsule  2   No facility-administered medications prior to visit.      Allergies:   Patient has no known allergies.   Social History   Socioeconomic History  . Marital status: Married    Spouse name: Not on file  . Number of children: Not on file  . Years of education: Not on file  . Highest education level: Not on file  Occupational History  . Not on file  Social Needs  . Financial resource strain: Not on file  . Food insecurity:    Worry: Not on file    Inability: Not on file  . Transportation needs:    Medical: Not on file    Non-medical: Not on file  Tobacco Use  . Smoking status: Current  Every Day Smoker    Packs/day: 1.00    Types: Cigarettes    Start date: 09/24/1948  . Smokeless tobacco: Never Used  Substance and Sexual Activity  . Alcohol use: Not Currently    Frequency: Never  . Drug use: Never  . Sexual activity: Not Currently  Lifestyle  . Physical activity:    Days per week: 0 days    Minutes per session: 0 min  . Stress: Only a little  Relationships  . Social connections:    Talks on phone: Not on file    Gets together: Not on file    Attends religious service: Not on file    Active member of club or organization: Not on file    Attends meetings of clubs or organizations: Not on file    Relationship status: Not on file  Other Topics Concern  . Not on file  Social History Narrative  . Not on file     Family History:  The patient's family history includes Cancer in his sister and sister; Heart attack in his brother; Heart disease in his brother.   Review of Systems:   Please see the history of present illness.     General:  No chills, fever, night sweats or weight changes.  Cardiovascular:  No chest pain, edema, orthopnea, palpitations, paroxysmal nocturnal dyspnea. Positive for dyspnea on exertion and fatigue.  Dermatological: No rash, lesions/masses Respiratory: No cough, dyspnea Urologic: No hematuria, dysuria Abdominal:   No nausea, vomiting, diarrhea, bright red blood per rectum, melena, or hematemesis Neurologic:  No visual changes, wkns, changes in mental status. All other systems reviewed and are otherwise negative except as noted above.   Physical Exam:    VS:  BP 120/64 (BP Location: Left Arm)   Pulse 84   Ht 5\' 7"  (1.702 m)   Wt 142 lb (64.4 kg)   SpO2 91%   BMI 22.24 kg/m    General: Well developed, elderly Caucasian male appearing in no acute distress. Head: Normocephalic, atraumatic, sclera non-icteric, no xanthomas, nares are without discharge.  Neck: No carotid bruits. JVD not elevated.  Lungs: Respirations regular and  unlabored, without wheezes or rales.  Heart: Regular rate and rhythm. No S3 or S4.  No murmur, no rubs, or gallops appreciated. Abdomen: Soft, non-tender, non-distended with normoactive bowel sounds. No hepatomegaly. No rebound/guarding. No obvious abdominal masses. Msk:  Strength and tone appear normal for age. No joint deformities or effusions. Extremities: No clubbing or cyanosis. No lower extremity edema.  Distal pedal pulses are 2+ bilaterally. Neuro: Alert and oriented X 3. Moves all extremities spontaneously. No focal deficits noted. Psych:  Responds to questions appropriately with a normal affect. Skin: No rashes or lesions noted  Wt  Readings from Last 3 Encounters:  02/02/18 142 lb (64.4 kg)  01/07/18 145 lb 3.2 oz (65.9 kg)  12/26/17 136 lb 11.2 oz (62 kg)     Studies/Labs Reviewed:   EKG:  EKG is not ordered today.  EKG from recent NST is reviewed which shows NSR, HR 76, with diffuse TWI.   Recent Labs: 12/24/2017: ALT 10; B Natriuretic Peptide 509.0 02/02/2018: BUN 28; Creatinine, Ser 1.25; Hemoglobin 16.1; Platelets 104; Potassium 4.2; Sodium 142   Lipid Panel    Component Value Date/Time   CHOL 192 12/25/2017 0548   TRIG 139 12/25/2017 0548   HDL 25 (L) 12/25/2017 0548   CHOLHDL 7.7 12/25/2017 0548   VLDL 28 12/25/2017 0548   LDLCALC 139 (H) 12/25/2017 0548    Additional studies/ records that were reviewed today include:   Echocardiogram: 12/2017 Study Conclusions  - Left ventricle: Distal septal / apical hypokinesis. The cavity   size was mildly dilated. Wall thickness was increased in a   pattern of mild LVH. Systolic function was mildly reduced. The   estimated ejection fraction was in the range of 45% to 50%.   Doppler parameters are consistent with abnormal left ventricular   relaxation (grade 1 diastolic dysfunction). - Aortic valve: There was mild regurgitation. Valve area (VTI):   1.61 cm^2. Valve area (Vmax): 1.67 cm^2. Valve area (Vmean): 1.66    cm^2. - Mitral valve: There was mild regurgitation. - Left atrium: The atrium was moderately dilated. - Atrial septum: No defect or patent foramen ovale was identified. - Pulmonary arteries: PA peak pressure: 35 mm Hg (S).    Assessment:    1. Abnormal stress test   2. DOE (dyspnea on exertion)   3. Chronic combined systolic and diastolic heart failure ([place])   4. Essential hypertension   5. Mixed hyperlipidemia   6. Carotid stenosis, bilateral   7. Recent cerebrovascular accident (CVA)      Plan:   In order of problems listed above:  1. Abnormal Stress Test/[place]ys[place] on Exertion - The patient has reported a history of progressive dyspnea on exertion and fatigue over the past several months.  During his recent admission, an echocardiogram showed his EF was mildly reduced and an outpatient NST was obtained which showed a medium size, severe intensity, periapical defect which was partially really reversible and consistent with an infarct scar and mild peri-infarct ischemia. This was interpreted as an intermediate risk study. - He reports continual dyspnea on exertion and believes symptoms have slightly worsened over the past several weeks. We reviewed options including continual medical management versus a cardiac catheterization for definitive evaluation and the patient along with his family wish to proceed with a catheterization. This was reviewed with Dr. Domenic Polite who was in the office today. Will plan for a catheterization next week with instructions to hold Eliquis 48 hours prior to the procedure.  - The patient understands that risks include but are not limited to stroke (1 in 1000), death (1 in 31), kidney failure [usually temporary] (1 in 500), bleeding (1 in 200), allergic reaction [possibly serious] (1 in 200). Obtain CBC and BMET today. EKG was performed at the time of his recent NST and is available for review in Epic.  - continue ASA, BB, and statin therapy.   2. Chronic  Combined Systolic and Diastolic CHF - Recent echocardiogram showed his EF was mildly reduced at 45 to 50%.  He has chronic dyspnea on exertion which has acutely worsened over the past several  months but denies any recent orthopnea, PND, or lower extremity edema. Appears euvolemic by examination today. - Continue Lasix 40 mg daily along with Carvedilol and Lisinopril. Will recheck a BMET today  3. HTN - BP is well controlled at 120/64 during today's visit. Remains on Carvedilol 6.25 mg twice daily and Lisinopril 5 mg daily.  4. HLD - FLP in 12/2017 showed total cholesterol 192, HDL 25, and LDL 139. He has been started on Atorvastatin 40 mg daily. Will need repeat FLP and LFT's in 6 weeks.   5. Carotid Artery Stenosis - Carotid Dopplers in 18/8416 showed LICA stenosis greater than 70% and RICA stenosis at 50-69%.  - Scheduled to follow-up with vascular surgery later this month.  It appears he was also started on Eliquis during his recent hospitalization at the recommendations of Dr. Bridgett Larsson. Will defer continuing anticoagulation to Vascular Surgery and route today's note to Dr. Bridgett Larsson.    6. Recent CVA - Denies any residual neurological deficits. Followed by Neurology in the outpatient setting. His recent event monitor has not been officially resulted but no critical events were sent to the office during this timeframe. Remains on ASA and statin therapy.   Medication Adjustments/Labs and Tests Ordered: Current medicines are reviewed at length with the patient today.  Concerns regarding medicines are outlined above.  Medication changes, Labs and Tests ordered today are listed in the Patient Instructions below. Patient Instructions  Medication Instructions:  Your physician recommends that you continue on your current medications as directed. Please refer to the Current Medication list given to you today.  Labwork: TODAY   Testing/Procedures: Your physician has requested that you have a cardiac  catheterization. Cardiac catheterization is used to diagnose and/or treat various heart conditions. Doctors may recommend this procedure for a number of different reasons. The most common reason is to evaluate chest pain. Chest pain can be a symptom of coronary artery disease (CAD), and cardiac catheterization can show whether plaque is narrowing or blocking your heart's arteries. This procedure is also used to evaluate the valves, as well as measure the blood flow and oxygen levels in different parts of your heart. For further information please visit [email or web address]. Please follow instruction sheet, as given.  Follow-Up: Your physician recommends that you schedule a follow-up appointment in: 4 WEEKS   Any Other Special Instructions Will Be Listed Below (If Applicable).  If you need a refill on your cardiac medications before your next appointment, please call your pharmacy.    Signed, Erma Heritage, PA-C  02/02/2018 7:02 PM    [place] [address] [place],  [address] Phone: [phone]

## 2018-02-09 ENCOUNTER — Telehealth: Payer: Self-pay | Admitting: *Deleted

## 2018-02-09 NOTE — Telephone Encounter (Signed)
Pt contacted pre-catheterization scheduled at [facility] for: Tuesday February 10, 2018 12 noon Verified arrival time and place: [place] Entrance A at: 10 AM  No solid food after midnight prior to cath, clear liquids until 5 AM day of procedure. Verified no known allergies. Verified no diabetes medications.  Hold: Apixaban 02/08/18 until post procedure Furosemide 02/09/18 and 02/09/18-pt had already taken 02/09/18 KCL 02/09/18 and 02/10/18-pt had already taken 02/09/18 Lisinopril 02/09/18 and 02/10/18-pt had already taken 02/09/18 Naproxen 02/09/18 and 02/10/18-pt had already taken 02/09/18  Except hold medications AM meds can be  taken pre-cath with sip of water including: ASA 81 mg  Confirmed patient has responsible person to drive home post procedure and for 24 hours after you arrive home: yes  Discussed with patient's wife, [name].

## 2018-02-10 ENCOUNTER — Other Ambulatory Visit: Payer: Self-pay

## 2018-02-10 ENCOUNTER — Encounter (HOSPITAL_COMMUNITY)
Admission: RE | Disposition: A | Discharge status: Home / Self Care | Payer: Self-pay | Source: Ambulatory Visit | Attending: Interventional Cardiology

## 2018-02-10 ENCOUNTER — Inpatient Hospital Stay (HOSPITAL_COMMUNITY)
Admission: RE | Admit: 2018-02-10 | Discharge: 2018-02-13 | DRG: 247 | Disposition: A | Discharge status: Home / Self Care | Payer: Medicare Other | Source: Ambulatory Visit | Attending: Interventional Cardiology | Admitting: Interventional Cardiology

## 2018-02-10 ENCOUNTER — Encounter (HOSPITAL_COMMUNITY): Payer: Self-pay | Admitting: Interventional Cardiology

## 2018-02-10 DIAGNOSIS — I6523 Occlusion and stenosis of bilateral carotid arteries: Secondary | ICD-10-CM | POA: Diagnosis present

## 2018-02-10 DIAGNOSIS — I5042 Chronic combined systolic (congestive) and diastolic (congestive) heart failure: Secondary | ICD-10-CM

## 2018-02-10 DIAGNOSIS — I25119 Atherosclerotic heart disease of native coronary artery with unspecified angina pectoris: Secondary | ICD-10-CM

## 2018-02-10 DIAGNOSIS — Z720 Tobacco use: Secondary | ICD-10-CM | POA: Diagnosis present

## 2018-02-10 DIAGNOSIS — I10 Essential (primary) hypertension: Secondary | ICD-10-CM | POA: Diagnosis present

## 2018-02-10 DIAGNOSIS — I2582 Chronic total occlusion of coronary artery: Secondary | ICD-10-CM | POA: Diagnosis present

## 2018-02-10 DIAGNOSIS — Z7982 Long term (current) use of aspirin: Secondary | ICD-10-CM | POA: Diagnosis not present

## 2018-02-10 DIAGNOSIS — I7121 Aneurysm of the ascending aorta, without rupture: Secondary | ICD-10-CM | POA: Diagnosis present

## 2018-02-10 DIAGNOSIS — J449 Chronic obstructive pulmonary disease, unspecified: Secondary | ICD-10-CM | POA: Diagnosis present

## 2018-02-10 DIAGNOSIS — E782 Mixed hyperlipidemia: Secondary | ICD-10-CM | POA: Diagnosis present

## 2018-02-10 DIAGNOSIS — R9439 Abnormal result of other cardiovascular function study: Secondary | ICD-10-CM | POA: Diagnosis not present

## 2018-02-10 DIAGNOSIS — I712 Thoracic aortic aneurysm, without rupture: Secondary | ICD-10-CM | POA: Diagnosis present

## 2018-02-10 DIAGNOSIS — Z8249 Family history of ischemic heart disease and other diseases of the circulatory system: Secondary | ICD-10-CM

## 2018-02-10 DIAGNOSIS — I2510 Atherosclerotic heart disease of native coronary artery without angina pectoris: Principal | ICD-10-CM | POA: Diagnosis present

## 2018-02-10 DIAGNOSIS — Z8546 Personal history of malignant neoplasm of prostate: Secondary | ICD-10-CM | POA: Diagnosis not present

## 2018-02-10 DIAGNOSIS — Z8673 Personal history of transient ischemic attack (TIA), and cerebral infarction without residual deficits: Secondary | ICD-10-CM | POA: Diagnosis not present

## 2018-02-10 DIAGNOSIS — F17210 Nicotine dependence, cigarettes, uncomplicated: Secondary | ICD-10-CM | POA: Diagnosis present

## 2018-02-10 DIAGNOSIS — Z79899 Other long term (current) drug therapy: Secondary | ICD-10-CM

## 2018-02-10 DIAGNOSIS — R0609 Other forms of dyspnea: Secondary | ICD-10-CM

## 2018-02-10 DIAGNOSIS — Z955 Presence of coronary angioplasty implant and graft: Secondary | ICD-10-CM

## 2018-02-10 DIAGNOSIS — Z7901 Long term (current) use of anticoagulants: Secondary | ICD-10-CM | POA: Diagnosis not present

## 2018-02-10 DIAGNOSIS — I25110 Atherosclerotic heart disease of native coronary artery with unstable angina pectoris: Secondary | ICD-10-CM

## 2018-02-10 DIAGNOSIS — I639 Cerebral infarction, unspecified: Secondary | ICD-10-CM | POA: Diagnosis present

## 2018-02-10 DIAGNOSIS — I110 Hypertensive heart disease with heart failure: Secondary | ICD-10-CM | POA: Diagnosis present

## 2018-02-10 DIAGNOSIS — I509 Heart failure, unspecified: Secondary | ICD-10-CM

## 2018-02-10 HISTORY — PX: LEFT HEART CATH AND CORONARY ANGIOGRAPHY: CATH118249

## 2018-02-10 LAB — HEPARIN LEVEL (UNFRACTIONATED): Heparin Unfractionated: 0.17 IU/mL — ABNORMAL LOW (ref 0.30–0.70)

## 2018-02-10 LAB — APTT: aPTT: 35 seconds (ref 24–36)

## 2018-02-10 SURGERY — LEFT HEART CATH AND CORONARY ANGIOGRAPHY
Anesthesia: LOCAL

## 2018-02-10 MED ORDER — HEPARIN SODIUM (PORCINE) 1000 UNIT/ML IJ SOLN
INTRAMUSCULAR | Status: AC
  Filled 2018-02-10: qty 1

## 2018-02-10 MED ORDER — ACETAMINOPHEN 325 MG PO TABS: 650.0000 mg | ORAL_TABLET | ORAL | Status: DC | PRN

## 2018-02-10 MED ORDER — MIDAZOLAM HCL 2 MG/2ML IJ SOLN
INTRAMUSCULAR | Status: DC | PRN
  Administered 2018-02-10: 1 mg via INTRAVENOUS

## 2018-02-10 MED ORDER — HEPARIN (PORCINE) IN NACL 100-0.45 UNIT/ML-% IJ SOLN
1050.0000 [IU]/h | INTRAMUSCULAR | Status: DC
  Administered 2018-02-10: 950 [IU]/h via INTRAVENOUS
  Administered 2018-02-11: 1050 [IU]/h via INTRAVENOUS
  Filled 2018-02-10 (×3): qty 250

## 2018-02-10 MED ORDER — SODIUM CHLORIDE 0.9 % WEIGHT BASED INFUSION: 1.0000 mL/kg/h | INTRAVENOUS | Status: DC

## 2018-02-10 MED ORDER — SODIUM CHLORIDE 0.9 % IV SOLN: 250.0000 mL | INTRAVENOUS | Status: DC | PRN

## 2018-02-10 MED ORDER — FENTANYL CITRATE (PF) 100 MCG/2ML IJ SOLN
INTRAMUSCULAR | Status: AC
  Filled 2018-02-10: qty 2

## 2018-02-10 MED ORDER — ONDANSETRON HCL 4 MG/2ML IJ SOLN: 4.0000 mg | Freq: Four times a day (QID) | INTRAMUSCULAR | Status: DC | PRN

## 2018-02-10 MED ORDER — NITROGLYCERIN 1 MG/10 ML FOR IR/CATH LAB
INTRA_ARTERIAL | Status: AC
  Filled 2018-02-10: qty 10

## 2018-02-10 MED ORDER — ASPIRIN 81 MG PO CHEW: 81.0000 mg | CHEWABLE_TABLET | ORAL | Status: DC

## 2018-02-10 MED ORDER — LIDOCAINE HCL (PF) 1 % IJ SOLN
INTRAMUSCULAR | Status: DC | PRN
  Administered 2018-02-10: 2 mL

## 2018-02-10 MED ORDER — SODIUM CHLORIDE 0.9% FLUSH: 3.0000 mL | INTRAVENOUS | Status: DC | PRN

## 2018-02-10 MED ORDER — LIDOCAINE HCL (PF) 1 % IJ SOLN
INTRAMUSCULAR | Status: AC
  Filled 2018-02-10: qty 30

## 2018-02-10 MED ORDER — SODIUM CHLORIDE 0.9% FLUSH
3.0000 mL | Freq: Two times a day (BID) | INTRAVENOUS | Status: DC
  Administered 2018-02-11 – 2018-02-12 (×3): 3 mL via INTRAVENOUS

## 2018-02-10 MED ORDER — HEPARIN SODIUM (PORCINE) 1000 UNIT/ML IJ SOLN
INTRAMUSCULAR | Status: DC | PRN
  Administered 2018-02-10: 3000 [IU] via INTRAVENOUS

## 2018-02-10 MED ORDER — OXYCODONE HCL 5 MG PO TABS: 5.0000 mg | ORAL_TABLET | ORAL | Status: DC | PRN

## 2018-02-10 MED ORDER — HEPARIN (PORCINE) IN NACL 2-0.9 UNITS/ML
INTRAMUSCULAR | Status: AC | PRN
  Administered 2018-02-10 (×2): 500 mL via INTRA_ARTERIAL

## 2018-02-10 MED ORDER — SODIUM CHLORIDE 0.9 % WEIGHT BASED INFUSION
3.0000 mL/kg/h | INTRAVENOUS | Status: AC
  Administered 2018-02-10: 3 mL/kg/h via INTRAVENOUS

## 2018-02-10 MED ORDER — SODIUM CHLORIDE 0.9 % IV SOLN
INTRAVENOUS | Status: AC
  Administered 2018-02-10: 15:00:00 via INTRAVENOUS

## 2018-02-10 MED ORDER — IOPAMIDOL (ISOVUE-370) INJECTION 76%
INTRAVENOUS | Status: AC
  Filled 2018-02-10: qty 100

## 2018-02-10 MED ORDER — SODIUM CHLORIDE 0.9% FLUSH: 3.0000 mL | Freq: Two times a day (BID) | INTRAVENOUS | Status: DC

## 2018-02-10 MED ORDER — MIDAZOLAM HCL 2 MG/2ML IJ SOLN
INTRAMUSCULAR | Status: AC
  Filled 2018-02-10: qty 2

## 2018-02-10 MED ORDER — VERAPAMIL HCL 2.5 MG/ML IV SOLN
INTRAVENOUS | Status: AC
  Filled 2018-02-10: qty 2

## 2018-02-10 MED ORDER — VERAPAMIL HCL 2.5 MG/ML IV SOLN
INTRAVENOUS | Status: DC | PRN
  Administered 2018-02-10: 10 mL via INTRA_ARTERIAL

## 2018-02-10 MED ORDER — NITROGLYCERIN 1 MG/10 ML FOR IR/CATH LAB
INTRA_ARTERIAL | Status: DC | PRN
  Administered 2018-02-10: 200 ug via INTRACORONARY

## 2018-02-10 MED ORDER — FENTANYL CITRATE (PF) 100 MCG/2ML IJ SOLN
INTRAMUSCULAR | Status: DC | PRN
  Administered 2018-02-10: 25 ug via INTRAVENOUS

## 2018-02-10 MED ORDER — ASPIRIN 81 MG PO CHEW
81.0000 mg | CHEWABLE_TABLET | Freq: Every day | ORAL | Status: DC
  Administered 2018-02-11: 81 mg via ORAL
  Filled 2018-02-10: qty 1

## 2018-02-10 MED ORDER — HEPARIN (PORCINE) IN NACL 1000-0.9 UT/500ML-% IV SOLN
INTRAVENOUS | Status: AC
  Filled 2018-02-10: qty 1000

## 2018-02-10 MED ORDER — ISOSORBIDE MONONITRATE ER 30 MG PO TB24
30.0000 mg | ORAL_TABLET | Freq: Every day | ORAL | Status: DC
  Administered 2018-02-10 – 2018-02-13 (×4): 30 mg via ORAL
  Filled 2018-02-10 (×5): qty 1

## 2018-02-10 SURGICAL SUPPLY — 13 items
CATH INFINITI 5 FR JL3.5 (CATHETERS) ×2 IMPLANT
CATH INFINITI JR4 5F (CATHETERS) ×2 IMPLANT
COVER PRB 48X5XTLSCP FOLD TPE (BAG) ×1 IMPLANT
COVER PROBE 5X48 (BAG) ×1
DEVICE RAD COMP TR BAND LRG (VASCULAR PRODUCTS) ×2 IMPLANT
GLIDESHEATH SLEND A-KIT 6F 22G (SHEATH) ×2 IMPLANT
GUIDEWIRE INQWIRE 1.5J.035X260 (WIRE) ×1 IMPLANT
INQWIRE 1.5J .035X260CM (WIRE) ×2
KIT HEART LEFT (KITS) ×2 IMPLANT
PACK CARDIAC CATHETERIZATION (CUSTOM PROCEDURE TRAY) ×2 IMPLANT
TRANSDUCER W/STOPCOCK (MISCELLANEOUS) ×2 IMPLANT
TUBING CIL FLEX 10 FLL-RA (TUBING) ×2 IMPLANT
WIRE HI TORQ VERSACORE-J 145CM (WIRE) ×2 IMPLANT

## 2018-02-10 NOTE — Research (Signed)
CADFEM Informed Consent   Subject Name: Daniel Simon  Subject met inclusion and exclusion criteria.  The informed consent form, study requirements and expectations were reviewed with the subject and questions and concerns were addressed prior to the signing of the consent form.  The subject verbalized understanding of the trail requirements.  The subject agreed to participate in the CADFEM trial and signed the informed consent.  The informed consent was obtained prior to performance of any protocol-specific procedures for the subject.  A copy of the signed informed consent was given to the subject and a copy was placed in the subject's medical record.  Neva Seat 02/10/2018, 11:17 AM

## 2018-02-10 NOTE — Progress Notes (Addendum)
[place] for heparin Indication: chest pain/ACS, ?CVA  Heparin Dosing Weight: 62.1 kg  Labs: No results for input(s): HGB, HCT, PLT, APTT, LABPROT, INR, HEPARINUNFRC, HEPRLOWMOCWT, CREATININE, CKTOTAL, CKMB, TROPONINI in the last 72 hours.  Assessment: 65 yom s/p cath 7/9 after presenting with abnormal stress test/dyspnea on exertion. Cath showed 3V CAD and CVTS consulted. Patient on apixaban PTA for ?CVA. Pharmacy consulted to start heparin 8 hours post-sheath removal for ACS. Last dose of apixaban 7/6 pta. CBC pending. No bleed documented. Sheath removed at 1327.  Will check baseline aPTT/heparin level to ensure apixaban no longer influencing heparin level.  Goal of Therapy:  Heparin level 0.3-0.7 units/ml Monitor platelets by anticoagulation protocol: Yes   Plan:  Baseline aPTT/heparin level Start heparin at 950 units/hr 8 hours post-sheath removal at 2130 8h heparin level/aPTT Monitor daily heparin level/aPTT/CBC, s/sx bleeding F/u plans for CABG - apixaban on hold  Elicia Lamp, PharmD, BCPS Clinical Pharmacist 02/10/2018 2:43 PM

## 2018-02-10 NOTE — Consult Note (Addendum)
[address]       [place] [address]             [phone]          CARDIOTHORACIC SURGERY CONSULTATION REPORT  PCP is Dione Housekeeper, MD Referring Provider is Belva Crome, MD Primary Cardiologist is Arnoldo Lenis, MD  Reason for consultation:  3-vessel CAD  HPI:  Patient is an 83 year old male with no previous history of coronary artery disease but risk factors notable for recently discovered history of chronic systolic congestive heart failure, recent embolic stroke, hyperlipidemia, and severe COPD with long-standing tobacco abuse who has been referred for surgical consultation to discuss treatment options for management of severe multivessel coronary artery disease.  Patient denies any known history of the above medical problems.  He smokes at least one pack of cigarettes daily but has remained functionally independent throughout his entire life.  In May of this year he was hospitalized at [facility] with acute left sided numbness, altered mental status, confusion, dizziness, and loss of balance.  He was diagnosed with multiple embolic strokes based upon MRI and MRA of the head.  Carotid duplex scan and CTA revealed bilateral internal carotid artery disease greater than 70% on the left.  CTA confirmed similar findings but also revealed severe atherosclerotic disease of the distal ascending thoracic aorta and transverse aortic arch.  Echocardiogram performed at that time revealed moderate left ventricular systolic dysfunction with ejection fraction estimated 45 to 50%.  There was felt to be mild mitral regurgitation.  No obvious source for cardiac embolus was identified. Dual antiplatelet therapy was initiated. The patient admitted to long-standing history of symptoms of exertional shortness of breath and the patient was discharged home and referred for outpatient cardiology evaluation.  The patient was evaluated by Dr. Harl Bowie and subsequently  underwent nuclear medicine stress test.  There was reportedly a medium-sized severe periapical perfusion defect and mid inferior wall defect that were partially reversible and consistent with infarct scar and peri-infarct ischemia.  Ejection fraction was 42%.  The patient was scheduled for elective diagnostic cardiac catheterization performed earlier today by Dr. Tamala Julian.  Catheterization revealed severe multivessel coronary artery disease.  The patient was hospitalized and urgent cardiothoracic surgical consultation requested.  The patient is married and lives in [place] with his wife.  He has been retired for many years.  In retirement he spends a fair amount of time fishing.  He reports that he has remained functionally independent.  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  He admits to long-standing symptoms of exertional shortness of breath which are moderately limiting.  He has a chronic cough.  He smokes at least one pack of cigarettes daily.  He denies any history of exertional chest pain or chest tightness.  He denies any history of resting shortness of breath, PND, orthopnea, or lower extremity edema.  The patient denies any known history of stroke and does not recall being in the hospital at [facility].  His wife states that he has had significant confusion since his stroke but that he seems to be a little bit improved.  Patient admits to problems with short-term memory loss.  Past Medical History:  Diagnosis Date  . Cancer [facility]    prostate  . Renal disorder    kidney stones    Past Surgical History:  Procedure Laterality Date  . LEFT HEART CATH AND CORONARY ANGIOGRAPHY N/A 02/10/2018  Procedure: LEFT HEART CATH AND CORONARY ANGIOGRAPHY;  Surgeon: Belva Crome, MD;  Location: [facility];  Service: Cardiovascular;  Laterality: N/A;  . PROSTATE BIOPSY      Family History  Problem Relation Age of Onset  . Cancer Sister   . Cancer Sister   . Heart attack Brother   .  Heart disease Brother     Social History   Socioeconomic History  . Marital status: Married    Spouse name: Not on file  . Number of children: Not on file  . Years of education: Not on file  . Highest education level: Not on file  Occupational History  . Not on file  Social Needs  . Financial resource strain: Not on file  . Food insecurity:    Worry: Not on file    Inability: Not on file  . Transportation needs:    Medical: Not on file    Non-medical: Not on file  Tobacco Use  . Smoking status: Current Every Day Smoker    Packs/day: 1.00    Types: Cigarettes    Start date: 09/24/1948  . Smokeless tobacco: Never Used  Substance and Sexual Activity  . Alcohol use: Not Currently    Frequency: Never  . Drug use: Never  . Sexual activity: Not Currently  Lifestyle  . Physical activity:    Days per week: 0 days    Minutes per session: 0 min  . Stress: Only a little  Relationships  . Social connections:    Talks on phone: Not on file    Gets together: Not on file    Attends religious service: Not on file    Active member of club or organization: Not on file    Attends meetings of clubs or organizations: Not on file    Relationship status: Not on file  . Intimate partner violence:    Fear of current or ex partner: Not on file    Emotionally abused: Not on file    Physically abused: Not on file    Forced sexual activity: Not on file  Other Topics Concern  . Not on file  Social History Narrative  . Not on file    Prior to Admission medications   Medication Sig Start Date End Date Taking? Authorizing Provider  albuterol (PROVENTIL HFA;VENTOLIN HFA) 108 (90 Base) MCG/ACT inhaler Inhale 2 puffs into the lungs every 6 (six) hours as needed for wheezing or shortness of breath. 12/26/17  Yes Memon, Jolaine Artist, MD  apixaban (ELIQUIS) 5 MG TABS tablet Take 1 tablet (5 mg total) by mouth 2 (two) times daily. 01/07/18  Yes BranchAlphonse Guild, MD  aspirin EC 81 MG tablet Take 81 mg by  mouth daily.   Yes [provider]  atorvastatin (LIPITOR) 40 MG tablet Take 1 tablet (40 mg total) by mouth daily at 6 PM. 12/26/17  Yes Memon, Jolaine Artist, MD  carvedilol (COREG) 6.25 MG tablet Take 1 tablet (6.25 mg total) by mouth 2 (two) times daily with a meal. 12/26/17  Yes Kathie Dike, MD  furosemide (LASIX) 40 MG tablet Take 1 tablet (40 mg total) by mouth daily. 12/26/17 12/26/18 Yes Kathie Dike, MD  lisinopril (PRINIVIL,ZESTRIL) 5 MG tablet Take 1 tablet (5 mg total) by mouth daily. 01/07/18 04/07/18 Yes BranchAlphonse Guild, MD  naproxen sodium (ALEVE) 220 MG tablet Take 440 mg by mouth daily as needed (pain).   Yes [provider]  potassium chloride 20 MEQ TBCR Take 20 mEq  by mouth daily. 12/26/17  Yes Kathie Dike, MD  tiotropium (SPIRIVA HANDIHALER) 18 MCG inhalation capsule Place 1 capsule (18 mcg total) into inhaler and inhale daily. 12/26/17 12/26/18 Yes Kathie Dike, MD    Current Facility-Administered Medications  Medication Dose Route Frequency Provider Last Rate Last Dose  . 0.9 %  sodium chloride infusion  250 mL Intravenous PRN Belva Crome, MD      . acetaminophen (TYLENOL) tablet 650 mg  650 mg Oral Q4H PRN Belva Crome, MD      . Derrill Memo ON 02/11/2018] aspirin chewable tablet 81 mg  81 mg Oral Daily Belva Crome, MD      . heparin ADULT infusion 100 units/mL (25000 units/271mL sodium chloride 0.45%)  950 Units/hr Intravenous Continuous Romona Curls, RPH      . isosorbide mononitrate (IMDUR) 24 hr tablet 30 mg  30 mg Oral Daily Belva Crome, MD   30 mg at 02/10/18 1548  . ondansetron (ZOFRAN) injection 4 mg  4 mg Intravenous Q6H PRN Belva Crome, MD      . oxyCODONE (Oxy IR/ROXICODONE) immediate release tablet 5-10 mg  5-10 mg Oral Q4H PRN Belva Crome, MD      . sodium chloride flush (NS) 0.9 % injection 3 mL  3 mL Intravenous Q12H Belva Crome, MD      . sodium chloride flush (NS) 0.9 % injection 3 mL  3 mL Intravenous PRN Belva Crome,  MD        No Known Allergies    Review of Systems:   General:  normal appetite, normal energy, no weight gain, no weight loss, no fever  Cardiac:  no chest pain with exertion, no chest pain at rest, + SOB with exertion, no resting SOB, no PND, no orthopnea, no palpitations, no arrhythmia, no atrial fibrillation, no LE edema, no dizzy spells, no syncope  Respiratory:  + shortness of breath, no home oxygen, + productive cough, + dry cough, no bronchitis, no wheezing, no hemoptysis, no asthma, no pain with inspiration or cough, no sleep apnea, no CPAP at night  GI:   no difficulty swallowing, no reflux, no frequent heartburn, no hiatal hernia, no abdominal pain, no constipation, no diarrhea, no hematochezia, no hematemesis, no melena  GU:   no dysuria,  no frequency, no urinary tract infection, no hematuria, + enlarged prostate, + kidney stones, no kidney disease  Vascular:  no pain suggestive of claudication, no pain in feet, no leg cramps, no varicose veins, no DVT, no non-healing foot ulcer  Neuro:   + stroke, no TIA's, no seizures, no headaches, no temporary blindness one eye,  no slurred speech, no peripheral neuropathy, no chronic pain, + some reported instability of gait, + memory/cognitive dysfunction  Musculoskeletal: no arthritis , no joint swelling, no myalgias, no difficulty walking, normal mobility   Skin:   no rash, no itching, no skin infections, no pressure sores or ulcerations  Psych:   no anxiety, no depression, no nervousness, no unusual recent stress  Eyes:   + blurry vision, no floaters, no recent vision changes, does not wears glasses or contacts  ENT:   no hearing loss, no loose or painful teeth, no dentures, last saw dentist many years ago  Hematologic:  no easy bruising, no abnormal bleeding, no clotting disorder, no frequent epistaxis  Endocrine:  no diabetes, does not check CBG's at home     Physical Exam:   BP 110/63 (BP Location: Left  Arm)   Pulse 67   Temp 98.3  F (36.8 C) (Oral)   Resp 16   Ht 5\' 7"  (1.702 m)   Wt 136 lb 14.5 oz (62.1 kg)   SpO2 95%   BMI 21.44 kg/m   General:  Thin, elderly male NAD   HEENT:  Unremarkable   Neck:   no JVD, no bruits, no adenopathy   Chest:   clear to auscultation, symmetrical breath sounds, no wheezes, no rhonchi   CV:   RRR, no  murmur   Abdomen:  soft, non-tender, no masses   Extremities:  warm, well-perfused, pulses diminished, no lower extremity edema  Rectal/GU  Deferred  Neuro:   Grossly non-focal and symmetrical throughout  Skin:   Clean and dry, no rashes, no breakdown  Diagnostic Tests:  Lab Results: No results for input(s): WBC, HGB, HCT, PLT in the last 72 hours. BMET: No results for input(s): NA, K, CL, CO2, GLUCOSE, BUN, CREATININE, CALCIUM in the last 72 hours.  CBG (last 3)  No results for input(s): GLUCAP in the last 72 hours. PT/INR:  No results for input(s): LABPROT, INR in the last 72 hours.  CXR:  N/A  MRI HEAD WITHOUT CONTRAST  MRA HEAD WITHOUT CONTRAST  TECHNIQUE: Multiplanar, multiecho pulse sequences of the brain and surrounding structures were obtained without intravenous contrast. Angiographic images of the head were obtained using MRA technique without contrast.  COMPARISON:  Head CT 12/24/2017  FINDINGS: MRI HEAD FINDINGS  Multiple sequences are up to moderately motion degraded.  Brain: There is a subcentimeter acute infarct in the posterior right pons. A 3 mm focus of trace diffusion hyperintensity in the right frontal lobe near the anterior aspect of the insula demonstrates normal ADC and may represent a subacute infarct. There is also a 1.6 cm focus of abnormal diffusion signal and T2 hyperintensity without reduced ADC in the left body of the corpus callosum with slight local mass effect. Chronic lacunar infarcts are noted in the right basal ganglia, right thalamus, and left cerebral hemispheric white matter. Patchy T2 hyperintensities elsewhere  in the cerebral white matter and pons are nonspecific but compatible with mild chronic small vessel ischemic disease. There is mild cerebral atrophy.  Vascular: Partially abnormal distal left vertebral artery, more fully evaluated below. Other major intracranial vascular flow voids are preserved.  Skull and upper cervical spine: Unremarkable bone marrow signal.  Sinuses/Orbits: Bilateral cataract extraction. Paranasal sinuses and mastoid air cells are clear.  Other: None.  MRA HEAD FINDINGS  The study is mildly motion degraded.  The visualized distal right vertebral artery is widely patent. There is no flow related enhancement in the majority of the left V4 segment suggesting occlusion, possibly with minimal retrograde flow distally. AICA and SCA origins are grossly patent bilaterally. The basilar artery is widely patent. The PCAs are patent without evidence of significant proximal stenosis. Posterior communicating arteries are not identified and may be absent or diminutive.  The internal carotid arteries are patent from skull base to carotid termini. Focally diminished signal in the distal right petrous ICA may be artifactual. There is the suggestion of mild right and possibly left paraclinoid stenosis versus artifact from motion and vessel orientation. The left cavernous ICA is slightly ectatic in appearance. A 2.5-3 mm funnel shaped outpouching from the left supraclinoid ICA in the posterior communicating region may represent an infundibulum or aneurysm. An associated vessel arising from this outpouching is not resolved on this MRA, however motion artifact limits assessment.  The right A1 segment is absent. The left A1 segment is widely patent. Both A2 segments are patent without evidence of significant stenosis. The MCAs are patent without evidence of proximal branch occlusion or M1 stenosis. Motion artifact limits assessment for MCA branch vessel  stenoses.  IMPRESSION: 1. Small acute right pontine infarct. 2. 1.6 cm focus of diffusion abnormality in the left body of the corpus callosum with mild mass effect favored to represent a subacute infarct. A small neoplasm and demyelination are alternative considerations. Follow-up brain MRI without and with contrast is recommended in 6 weeks. 3. Possible punctate subacute right frontal infarct. 4. Mild chronic small vessel ischemic disease. 5. Motion degraded MRA without evidence of major branch occlusion or flow limiting proximal stenosis in the anterior circulation. Possible mild bilateral ICA stenoses. 6. Occluded distal left vertebral artery.   Electronically Signed   By: Logan Bores M.D.   On: 12/24/2017 12:54   BILATERAL CAROTID DUPLEX ULTRASOUND  TECHNIQUE: Pearline Cables scale imaging, color Doppler and duplex ultrasound were performed of bilateral carotid and vertebral arteries in the neck.  COMPARISON:  Brain MR 12/24/2017  FINDINGS: Criteria: Quantification of carotid stenosis is based on velocity parameters that correlate the residual internal carotid diameter with NASCET-based stenosis levels, using the diameter of the distal internal carotid lumen as the denominator for stenosis measurement.  The following velocity measurements were obtained:  RIGHT ICA: 169 cm/sec CCA: 56 cm/sec  SYSTOLIC ICA/CCA RATIO:  3  ECA:  198 cm/sec  LEFT  ICA: 330 cm/sec  CCA: 64 cm/sec  SYSTOLIC ICA/CCA RATIO:  3.9  ECA:  229 cm/sec  RIGHT CAROTID ARTERY: Heterogeneous plaque at the right carotid bulb and proximal internal carotid artery. External carotid artery is patent. Narrowing in the proximal internal carotid artery with a peak systolic velocity of 875 centimeters/second. Mid and distal internal carotid artery are patent.  RIGHT VERTEBRAL ARTERY: Antegrade flow and normal waveform in the right vertebral artery.  LEFT CAROTID ARTERY: Small amount  of echogenic plaque at the left carotid bulb. Mildly elevated peak systolic velocity in the external carotid artery. Plaque in proximal internal carotid artery. Markedly elevated peak systolic velocity in the proximal internal carotid artery measuring 330 centimeters/second. Elevated peak systolic velocity in the mid internal carotid artery measuring 255 centimeters/second. Distal internal carotid artery is patent.  LEFT VERTEBRAL ARTERY: Not visualized. Reported to be occluded based on the recent MRA.  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  Estimated degree of stenosis in left internal carotid artery is greater than 70%.  Estimated degree of stenosis in the right internal carotid artery is 50-69%.  Patent right vertebral artery. Left vertebral artery not visualized and reported to be occluded based on recent MRA.   Electronically Signed   By: Markus Daft M.D.   On: 12/25/2017 11:58    CT ANGIOGRAPHY NECK  TECHNIQUE: Multidetector CT imaging of the neck was performed using the standard protocol during bolus administration of intravenous contrast. Multiplanar CT image reconstructions and MIPs were obtained to evaluate the vascular anatomy. Carotid stenosis measurements (when applicable) are obtained utilizing NASCET criteria, using the distal internal carotid diameter as the denominator.  CONTRAST:  132mL ISOVUE-370 IOPAMIDOL (ISOVUE-370) INJECTION 76%  COMPARISON:  12/25/2017 carotid ultrasound.  12/24/2017 MRA head.  FINDINGS: Aortic arch: 4.1 cm ascending aortic aneurysm. Severe predominantly fibrofatty plaque of the aorta and proximal great vessels. Small outpouching of the brachiocephalic artery origin may represent penetrating ulcer or fibrofatty  plaque ulceration (series 7, image 114). Second penetrating ulcer or fibrofatty plaque ulceration of the proximal left subclavian artery (series  7, image 138). Multiple segments of stenosis of the left subclavian artery up to 50% moderate.  Right carotid system: Severe predominantly fibrofatty plaque with moderate 50% proximal ICA stenosis.  Left carotid system: Severe predominantly fibrofatty plaque of the carotid bifurcation with severe 70% proximal ICA stenosis.  Vertebral arteries: Right vertebral artery origin moderate 50-60% stenosis. Occlusion of left vertebral arteries V1, V2, and V3 segments.  Skeleton: Cervical spondylosis with degenerative changes greatest at the C5-C7 levels or uncovertebral and facet hypertrophy encroach on the neural foramen. Productive changes of the anterior C1-2 articulation. No high-grade bony canal stenosis.  Other neck: Subcarinal lymphadenopathy measuring 25 x 30 mm (series 4, image 5). Calcifications within lower paratracheal lymph nodes.  Upper chest: Severe emphysema.  IMPRESSION: 1. 4.1 cm ascending aortic aneurysm. Recommend annual imaging followup by CTA or MRA. This recommendation follows 2010 ACCF/AHA/AATS/ACR/ASA/SCA/SCAI/SIR/STS/SVM Guidelines for the Diagnosis and Management of Patients with Thoracic Aortic Disease. Circulation. 2010; 121: W580-D983. 2. Severe predominantly fibrofatty plaque of the aorta, great vessels, and carotid bifurcations. 3. Penetrating ulcer/fibrofatty plaque ulceration of the brachiocephalic artery origin and proximal left subclavian artery. 4. Right proximal ICA moderate 50% stenosis. 5. Left proximal ICA severe 70% stenosis. 6. Right vertebral origin moderate 50-60% stenosis. 7. Occluded left vertebral artery V1, V2, and V3 segments. Patent left V4 segment. 8. Severe emphysema. 9. Subcarinal lymphadenopathy which may be due to granulomatous disease given calcified lower paratracheal lymph nodes. Lymphoproliferative disorder or metastasis is also possible.   Electronically Signed   By: Kristine Garbe M.D.   On:  12/26/2017 16:00   Transthoracic Echocardiography  Patient:    Eliazer, Hemphill MR #:       [id number] Study Date: 12/25/2017 Gender:     M Age:        8 Height:     167.6 cm Weight:     65.8 kg BSA:        1.76 m^2 Pt. Status: Room:       [address]    Kathie Dike [id number]  [id number]    DZHGD, JMEQASTM [address] [id number]  Ester Rink [id number]  PERFORMING   Chmg, Forestine Na  SONOGRAPHER  Chelsea Androw  cc:  ------------------------------------------------------------------- LV EF: 45% -   50%  ------------------------------------------------------------------- Indications:      CVA 1.  ------------------------------------------------------------------- History:   PMH:   Congestive heart failure.  Risk factors:  Current tobacco use. Hypertension.  ------------------------------------------------------------------- Study Conclusions  - Left ventricle: Distal septal / apical hypokinesis. The cavity   size was mildly dilated. Wall thickness was increased in a   pattern of mild LVH. Systolic function was mildly reduced. The   estimated ejection fraction was in the range of 45% to 50%.   Doppler parameters are consistent with abnormal left ventricular   relaxation (grade 1 diastolic dysfunction). - Aortic valve: There was mild regurgitation. Valve area (VTI):   1.61 cm^2. Valve area (Vmax): 1.67 cm^2. Valve area (Vmean): 1.66   cm^2. - Mitral valve: There was mild regurgitation. - Left atrium: The atrium was moderately dilated. - Atrial septum: No defect or patent foramen ovale was identified. - Pulmonary arteries: PA peak pressure: 35 mm Hg (S).  ------------------------------------------------------------------- Study data:  No prior study was available for comparison.  Study status:  Routine.  Procedure:  The patient reported no pain pre or post test. Transthoracic echocardiography. Image quality  was adequate.  Study completion:  There were no complications. Transthoracic echocardiography.  M-mode, complete 2D, spectral Doppler, and color Doppler.  Birthdate:  Patient birthdate: [birth year].  Age:  Patient is 83 yr old.  Sex:  Gender: male. BMI: 23.4 kg/m^2.  Blood pressure:     144/93  Patient status: Inpatient.  Study date:  Study date: 12/25/2017. Study time: 01:16 PM.  Location:  Bedside.  -------------------------------------------------------------------  ------------------------------------------------------------------- Left ventricle:  Distal septal / apical hypokinesis. The cavity size was mildly dilated. Wall thickness was increased in a pattern of mild LVH. Systolic function was mildly reduced. The estimated ejection fraction was in the range of 45% to 50%. Doppler parameters are consistent with abnormal left ventricular relaxation (grade 1 diastolic dysfunction).  ------------------------------------------------------------------- Aortic valve:   Trileaflet; mildly thickened, mildly calcified leaflets. Sclerosis without stenosis.  Doppler:  There was mild regurgitation.    VTI ratio of LVOT to aortic valve: 0.47. Valve area (VTI): 1.61 cm^2. Indexed valve area (VTI): 0.92 cm^2/m^2. Peak velocity ratio of LVOT to aortic valve: 0.48. Valve area (Vmax): 1.67 cm^2. Indexed valve area (Vmax): 0.95 cm^2/m^2. Mean velocity ratio of LVOT to aortic valve: 0.48. Valve area (Vmean): 1.66 cm^2. Indexed valve area (Vmean): 0.94 cm^2/m^2.    Mean gradient (S): 7 mm Hg. Peak gradient (S): 14 mm Hg.  ------------------------------------------------------------------- Mitral valve:   Mildly thickened leaflets .  Doppler:  There was mild regurgitation.  ------------------------------------------------------------------- Left atrium:  The atrium was moderately dilated.  ------------------------------------------------------------------- Atrial septum:  No defect or patent  foramen ovale was identified.   ------------------------------------------------------------------- Right ventricle:  The cavity size was normal. Wall thickness was normal. Systolic function was normal.  ------------------------------------------------------------------- Pulmonic valve:    Doppler:  There was trivial regurgitation.  ------------------------------------------------------------------- Tricuspid valve:   Doppler:  There was mild regurgitation.  ------------------------------------------------------------------- Right atrium:  The atrium was normal in size.  ------------------------------------------------------------------- Pericardium:  The pericardium was normal in appearance.  ------------------------------------------------------------------- Systemic veins: Inferior vena cava: The vessel was normal in size. The respirophasic diameter changes were in the normal range (>= 50%), consistent with normal central venous pressure.  ------------------------------------------------------------------- Measurements   Left ventricle                           Value          Reference  LV ID, ED, PLAX chordal          (L)     42.8  mm       43 - 52  LV ID, ES, PLAX chordal                  35.2  mm       23 - 38  LV fx shortening, PLAX chordal   (L)     18    %        >=29  LV PW thickness, ED                      13.8  mm       ----------  IVS/LV PW ratio, ED                      0.99           <=1.3  Stroke volume, 2D  53    ml       ----------  Stroke volume/bsa, 2D                    30    ml/m^2   ----------  LV e&', lateral                           6.42  cm/s     ----------  LV E/e&', lateral                         6.34           ----------  LV e&', medial                            4.03  cm/s     ----------  LV E/e&', medial                          10.1           ----------  LV e&', average                           5.23  cm/s      ----------  LV E/e&', average                         7.79           ----------    Ventricular septum                       Value          Reference  IVS thickness, ED                        13.6  mm       ----------    LVOT                                     Value          Reference  LVOT ID, S                               21    mm       ----------  LVOT area                                3.46  cm^2     ----------  LVOT peak velocity, S                    91.4  cm/s     ----------  LVOT mean velocity, S                    58    cm/s     ----------  LVOT VTI, S                              15.4  cm       ----------  LVOT peak gradient, S                    3     mm Hg    ----------    Aortic valve                             Value          Reference  Aortic valve peak velocity, S            189   cm/s     ----------  Aortic valve mean velocity, S            121   cm/s     ----------  Aortic valve VTI, S                      33    cm       ----------  Aortic mean gradient, S                  7     mm Hg    ----------  Aortic peak gradient, S                  14    mm Hg    ----------  VTI ratio, LVOT/AV                       0.47           ----------  Aortic valve area, VTI                   1.61  cm^2     ----------  Aortic valve area/bsa, VTI               0.92  cm^2/m^2 ----------  Velocity ratio, peak, LVOT/AV            0.48           ----------  Aortic valve area, peak velocity         1.67  cm^2     ----------  Aortic valve area/bsa, peak              0.95  cm^2/m^2 ----------  velocity  Velocity ratio, mean, LVOT/AV            0.48           ----------  Aortic valve area, mean velocity         1.66  cm^2     ----------  Aortic valve area/bsa, mean              0.94  cm^2/m^2 ----------  velocity  Aortic regurg pressure half-time         532   ms       ----------    Aorta                                    Value          Reference  Aortic root ID, ED                        31    mm       ----------    Left atrium  Value          Reference  LA ID, A-P, ES                           47    mm       ----------  LA ID/bsa, A-P                   (H)     2.68  cm/m^2   <=2.2  LA volume, ES, 1-p A4C                   45.5  ml       ----------  LA volume/bsa, ES, 1-p A4C               25.9  ml/m^2   ----------  LA volume, ES, 1-p A2C                   66.3  ml       ----------  LA volume/bsa, ES, 1-p A2C               37.8  ml/m^2   ----------    Mitral valve                             Value          Reference  Mitral E-wave peak velocity              40.7  cm/s     ----------  Mitral A-wave peak velocity              92.5  cm/s     ----------  Mitral deceleration time         (H)     430   ms       150 - 230  Mitral E/A ratio, peak                   0.4            ----------    Pulmonary arteries                       Value          Reference  PA pressure, S, DP               (H)     35    mm Hg    <=30    Tricuspid valve                          Value          Reference  Tricuspid regurg peak velocity           282   cm/s     ----------  Tricuspid peak RV-RA gradient            32    mm Hg    ----------    Right atrium                             Value          Reference  RA ID, S-I, ES, A4C  48.4  mm       34 - 49  RA area, ES, A4C                         14.9  cm^2     8.3 - 19.5  RA volume, ES, A/L                       36.2  ml       ----------  RA volume/bsa, ES, A/L                   20.6  ml/m^2   ----------    Systemic veins                           Value          Reference  Estimated CVP                            3     mm Hg    ----------    Right ventricle                          Value          Reference  TAPSE                                    16.6  mm       ----------  RV pressure, S, DP               (H)     35    mm Hg    <=30  RV s&', lateral, S                        13.5  cm/s      ----------  Legend: (L)  and  (H)  mark values outside specified reference range.  ------------------------------------------------------------------- Prepared and Electronically Authenticated by  Jenkins Rouge, M.D. 2019-05-23T15:22:30     LEFT HEART CATH AND CORONARY ANGIOGRAPHY  Conclusion    Severe three-vessel coronary artery disease  Total occlusion of the mid LAD with left to left collaterals.  95% proximal LAD before a large diagonal branch.  The first diagonal contains 50% proximal narrowing.  Ramus intermedius with 50 to 60% mid vessel stenosis.  Large first obtuse marginal contains mid segment diffuse disease up to 70%.  RCA is totally occluded proximally.  Distal vessel fills by  left to right collaterals.  Left ventricular systolic dysfunction with apical akinesis/dyskinesis, EF 35 to 45%.  Normal LVEDP.  RECOMMENDATIONS:   The patient has severe multivessel coronary disease with chronic total occlusion of the mid LAD and proximal RCA.  In addition there is further proximal LAD disease before a large diagonal and diffuse disease in both a large ramus intermedius and first obtuse marginal.  Should be considered for surgical revascularization.  He has significant other comorbidities.  We will get TCTS consultation to determine if he is a surgical candidate.  Further management will be dependent upon surgical risk and appropriateness for surgery.  Admit to telemetry  Start long-acting nitrates  Continue aspirin  IV heparin    Indications   Abnormal nuclear cardiac imaging  test [R93.1 (ICD-10-CM)]  Coronary artery disease involving native coronary artery of native heart with angina pectoris ([place]) [I25.119 (ICD-10-CM)]  Procedural Details/Technique   Technical Details The right radial area was sterilely prepped and draped. Intravenous sedation with Versed and fentanyl was administered. 1% Xylocaine was infiltrated to achieve local analgesia. Using  real-time vascular ultrasound, a double wall stick with an angiocath was utilized to obtain intra-arterial access. A VUS image was saved for the record.The modified Seldinger technique was used to place a 77F " Slender" sheath in the right radial artery. Weight based heparin was administered. Coronary angiography was done using 5 F catheters. Right coronary angiography was performed with a JR4. Left ventricular hemodymic recordings and angiography was done using the JR 4 catheter and hand injection. Left coronary angiography was performed with a JL 3.5 cm.  Hemostasis was achieved using a pneumatic band.  During this procedure the patient is administered a total of Versed 1 mg and Fentanyl 25 mg to achieve and maintain moderate conscious sedation. The patient's heart rate, blood pressure, and oxygen saturation are monitored continuously during the procedure. The period of conscious sedation is 24 minutes, of which I was present face-to-face 100% of this time.   Estimated blood loss <50 mL.  During this procedure the patient was administered the following to achieve and maintain moderate conscious sedation: Versed 1 mg, Fentanyl 25 mcg, while the patient's heart rate, blood pressure, and oxygen saturation were continuously monitored. The period of conscious sedation was 24 minutes, of which I was present face-to-face 100% of this time.  Coronary Findings   Diagnostic  Dominance: Right  Left Anterior Descending  Collaterals  Dist LAD filled by collaterals from Ramus.    Prox LAD lesion 95% stenosed  Prox LAD lesion is 95% stenosed.  Mid LAD lesion 100% stenosed  Mid LAD lesion is 100% stenosed.  First Diagonal Branch  Ost 1st Diag to 1st Diag lesion 65% stenosed  Ost 1st Diag to 1st Diag lesion is 65% stenosed.  Ramus Intermedius  Ost Ramus to Ramus lesion 65% stenosed  Ost Ramus to Ramus lesion is 65% stenosed.  Left Circumflex  First Obtuse Marginal Branch  1st Mrg lesion 70% stenosed   1st Mrg lesion is 70% stenosed.  Right Coronary Artery  Vessel is small.  Ost RCA to Prox RCA lesion 100% stenosed  Ost RCA to Prox RCA lesion is 100% stenosed.  Mid RCA to Dist RCA lesion 100% stenosed  Mid RCA to Dist RCA lesion is 100% stenosed.  Inferior Septal  Collaterals  Inf Sept filled by collaterals from 1st Sept.    Intervention   No interventions have been documented.  Wall Motion              Left Heart   Left Ventricle The left ventricular size is normal. There is mild to moderate left ventricular systolic dysfunction. LV end diastolic pressure is normal. The left ventricular ejection fraction is 35-45% by visual estimate. There are LV function abnormalities due to segmental dysfunction.  Coronary Diagrams   Diagnostic Diagram       Implants    No implant documentation for this case.  MERGE Images   Show images for CARDIAC CATHETERIZATION   Link to Procedure Log   Procedure Log    Hemo Data    Most Recent Value  AO Systolic Pressure 993 mmHg  AO Diastolic Pressure 59 mmHg  AO Mean 91 mmHg  LV Systolic Pressure 716 mmHg  LV Diastolic  Pressure 0 mmHg  LV EDP 7 mmHg  Arterial Occlusion Pressure Extended Systolic Pressure 408 mmHg  Arterial Occlusion Pressure Extended Diastolic Pressure 59 mmHg  Arterial Occlusion Pressure Extended Mean Pressure 89 mmHg  Left Ventricular Apex Extended Systolic Pressure 144 mmHg  Left Ventricular Apex Extended Diastolic Pressure 3 mmHg  Left Ventricular Apex Extended EDP Pressure 7 mmHg     Impression:  I have personally reviewed the patient's recent transthoracic echocardiogram, diagnostic cardiac catheterization, MRI and MRA of the head, and CT angiogram of the neck.  Patient has severe multivessel coronary artery disease with high-grade stenosis of proximal left anterior descending coronary artery followed by 100% occlusion of the mid left anterior descending coronary artery after takeoff of the large  diagonal branch.  The terminal portion of the chronically occluded left anterior descending coronary artery appears diffusely diseased.  There is 100% proximal occlusion of the right coronary artery with left-to-right collateral filling of terminal branches that are not well visualized.  Left ventricular systolic function is mildly reduced.  Patient does not have significant valvular heart disease but he does have mild aneurysmal enlargement of the ascending thoracic aorta (maximum 4.1 cm) with what appears to be severe shaggy atherosclerotic plaque in the distal ascending thoracic aorta and transverse aortic arch.  This was seen best on CT angiogram of the neck performed last May.  In addition, the patient has bilateral internal carotid artery stenosis greater than 70% on the left and likely severe COPD.  The patient denies symptoms of angina but admits to long-standing symptoms of exertional shortness of breath which likely correspond to symptoms of chronic systolic congestive heart failure complicated by the presence of underlying severe COPD.  Symptoms could also be angina equivalent.  I agree the patient might benefit from coronary revascularization with the potential for symptomatic improvement, decreased risk of future myocardial infarction, possibly some degree of improved long-term survival.  However, risks associated with coronary artery bypass grafting would likely be extremely high because of the patient's recent embolic strokes, severely diseased ascending thoracic aorta, and underlying severe COPD.   I do not recommend CABG at this time.   Plan:  I discussed the results of the patient's recent diagnostic tests and treatment options at length with the patient and his wife at the bedside this evening.  The patient seems to have a very limited understanding of his medical problems.  On the other hand, the patient's wife seems to understand the implications of his recent stroke and numerous comorbid  medical problems, and both the patient and his wife expressed desire for an aggressive approach to therapy.  They seem to understand the reasons why I do not recommend coronary artery bypass grafting at this time.  I recommend medical treatment for management of the patient's ischemic heart disease with or without consideration of possible PCI and stenting of the proximal left anterior descending coronary artery and diagonal branch.  I do not feel that long-term surveillance of the mild aneurysmal enlargement of the ascending aorta is necessary as I would not consider this patient a candidate for surgical replacement of his a sending aorta and transverse thoracic aortic arch.  Please call if we can be of further assistance.   I spent in excess of 120 minutes during the conduct of this hospital consultation and >50% of this time involved direct face-to-face encounter for counseling and/or coordination of the patient's care.    Valentina Gu. Roxy Manns, MD 02/10/2018 7:57 PM

## 2018-02-10 NOTE — Progress Notes (Signed)
Received post cath.Patient alert and oriented. Right radial cath site level 0. TR band with 14 cc air per report. Pt. Frequently reminded not to use right hand for now due to the risk of bleeding. Family at bedside.Will monitor accordingly.

## 2018-02-10 NOTE — Progress Notes (Signed)
Heparin restarted per order. No bleeding noted at cath site. Patient educate to call nurse if bleeding is noted. No other complaints at this time. Will continue to monitor patient.

## 2018-02-10 NOTE — Interval H&P Note (Signed)
History and Physical Interval Note:  Cath Lab Visit (complete for each Cath Lab visit)  Clinical Evaluation Leading to the Procedure:   ACS: No.  Non-ACS:    Anginal Classification: CCS III  Anti-ischemic medical therapy: No Therapy  Non-Invasive Test Results: Intermediate-risk stress test findings: cardiac mortality 1-3%/year  Prior CABG: No previous CABG       02/10/2018 12:44 PM  Darden Dates [place]  has presented today for surgery, with the diagnosis of cp, dyspnea on excertion  The various methods of treatment have been discussed with the patient and family. After consideration of risks, benefits and other options for treatment, the patient has consented to  Procedure(s): LEFT HEART CATH AND CORONARY ANGIOGRAPHY (N/A) as a surgical intervention .  The patient's history has been reviewed, patient examined, no change in status, stable for surgery.  I have reviewed the patient's chart and labs.  Questions were answered to the patient's satisfaction.     [name]

## 2018-02-11 ENCOUNTER — Encounter (HOSPITAL_COMMUNITY): Payer: Self-pay | Admitting: *Deleted

## 2018-02-11 DIAGNOSIS — I5042 Chronic combined systolic (congestive) and diastolic (congestive) heart failure: Secondary | ICD-10-CM

## 2018-02-11 DIAGNOSIS — R0609 Other forms of dyspnea: Secondary | ICD-10-CM

## 2018-02-11 DIAGNOSIS — R9439 Abnormal result of other cardiovascular function study: Secondary | ICD-10-CM

## 2018-02-11 DIAGNOSIS — I25110 Atherosclerotic heart disease of native coronary artery with unstable angina pectoris: Secondary | ICD-10-CM

## 2018-02-11 DIAGNOSIS — I6523 Occlusion and stenosis of bilateral carotid arteries: Secondary | ICD-10-CM

## 2018-02-11 DIAGNOSIS — I712 Thoracic aortic aneurysm, without rupture: Secondary | ICD-10-CM

## 2018-02-11 DIAGNOSIS — I10 Essential (primary) hypertension: Secondary | ICD-10-CM

## 2018-02-11 LAB — APTT
aPTT: 54 seconds — ABNORMAL HIGH (ref 24–36)
aPTT: 69 seconds — ABNORMAL HIGH (ref 24–36)

## 2018-02-11 LAB — BASIC METABOLIC PANEL
ANION GAP: 6 (ref 5–15)
BUN: 18 mg/dL (ref 8–23)
CHLORIDE: 113 mmol/L — AB (ref 98–111)
CO2: 23 mmol/L (ref 22–32)
Calcium: 8.9 mg/dL (ref 8.9–10.3)
Creatinine, Ser: 1.17 mg/dL (ref 0.61–1.24)
GFR calc non Af Amer: 57 mL/min — ABNORMAL LOW (ref >60)
Glucose, Bld: 100 mg/dL — ABNORMAL HIGH (ref 70–99)
Potassium: 4.3 mmol/L (ref 3.5–5.1)
Sodium: 142 mmol/L (ref 135–145)

## 2018-02-11 LAB — CBC
HEMATOCRIT: 40.3 % (ref 39.0–52.0)
HEMOGLOBIN: 13.5 g/dL (ref 13.0–17.0)
MCH: 30.1 pg (ref 26.0–34.0)
MCHC: 33.5 g/dL (ref 30.0–36.0)
MCV: 89.8 fL (ref 78.0–100.0)
Platelets: 95 10*3/uL — ABNORMAL LOW (ref 150–400)
RBC: 4.49 MIL/uL (ref 4.22–5.81)
RDW: 13.9 % (ref 11.5–15.5)
WBC: 6.6 10*3/uL (ref 4.0–10.5)

## 2018-02-11 LAB — HEPARIN LEVEL (UNFRACTIONATED): Heparin Unfractionated: 0.22 IU/mL — ABNORMAL LOW (ref 0.30–0.70)

## 2018-02-11 MED ORDER — ATORVASTATIN CALCIUM 40 MG PO TABS: 40.0000 mg | ORAL_TABLET | Freq: Every day | ORAL | Status: DC

## 2018-02-11 MED ORDER — SODIUM CHLORIDE 0.9 % IV SOLN: 250.0000 mL | INTRAVENOUS | Status: DC | PRN

## 2018-02-11 MED ORDER — CARVEDILOL 6.25 MG PO TABS
6.2500 mg | ORAL_TABLET | Freq: Two times a day (BID) | ORAL | Status: DC
  Administered 2018-02-11: 6.25 mg via ORAL
  Filled 2018-02-11 (×2): qty 1

## 2018-02-11 MED ORDER — SODIUM CHLORIDE 0.9% FLUSH
3.0000 mL | Freq: Two times a day (BID) | INTRAVENOUS | Status: DC
  Administered 2018-02-12: 3 mL via INTRAVENOUS

## 2018-02-11 MED ORDER — ASPIRIN 81 MG PO CHEW: 81.0000 mg | CHEWABLE_TABLET | Freq: Every day | ORAL | Status: DC

## 2018-02-11 MED ORDER — CARVEDILOL 3.125 MG PO TABS
6.2500 mg | ORAL_TABLET | Freq: Two times a day (BID) | ORAL | Status: DC
  Administered 2018-02-12: 6.25 mg via ORAL
  Filled 2018-02-11: qty 1

## 2018-02-11 MED ORDER — ATORVASTATIN CALCIUM 80 MG PO TABS
80.0000 mg | ORAL_TABLET | Freq: Every day | ORAL | Status: DC
  Administered 2018-02-11: 80 mg via ORAL
  Filled 2018-02-11 (×2): qty 1

## 2018-02-11 MED ORDER — SODIUM CHLORIDE 0.9 % WEIGHT BASED INFUSION
1.0000 mL/kg/h | INTRAVENOUS | Status: DC
  Administered 2018-02-11: 1 mL/kg/h via INTRAVENOUS

## 2018-02-11 MED ORDER — ASPIRIN 81 MG PO CHEW
81.0000 mg | CHEWABLE_TABLET | ORAL | Status: AC
  Administered 2018-02-12: 81 mg via ORAL
  Filled 2018-02-11: qty 1

## 2018-02-11 MED ORDER — SODIUM CHLORIDE 0.9% FLUSH: 3.0000 mL | INTRAVENOUS | Status: DC | PRN

## 2018-02-11 MED FILL — Heparin Sod (Porcine)-NaCl IV Soln 1000 Unit/500ML-0.9%: INTRAVENOUS | Qty: 500 | Status: AC

## 2018-02-11 NOTE — Progress Notes (Addendum)
Progress Note  Patient Name: Daniel Simon Date of Encounter: 02/11/2018  Primary Cardiologist: Carlyle Dolly, MD   Subjective   Feeling well. No chest pain, sob or palpitations.   Inpatient Medications    Scheduled Meds: . aspirin  81 mg Oral Daily  . isosorbide mononitrate  30 mg Oral Daily  . sodium chloride flush  3 mL Intravenous Q12H   Continuous Infusions: . sodium chloride    . heparin 1,050 Units/hr (02/11/18 0654)   PRN Meds: sodium chloride, acetaminophen, ondansetron (ZOFRAN) IV, oxyCODONE, sodium chloride flush   Vital Signs    Vitals:   02/10/18 1700 02/10/18 1921 02/11/18 0016 02/11/18 0611  BP: 117/64 110/63 (!) 116/52 124/64  Pulse: 81 67 70 66  Resp:  16 18 18   Temp:  98.3 F (36.8 C) 98 F (36.7 C) 98.3 F (36.8 C)  TempSrc:  Oral Oral Oral  SpO2:   93% 94%  Weight:    137 lb 3.2 oz (62.2 kg)  Height:        Intake/Output Summary (Last 24 hours) at 02/11/2018 1108 Last data filed at 02/11/2018 0654 Gross per 24 hour  Intake 688.84 ml  Output 0 ml  Net 688.84 ml   Filed Weights   02/10/18 1006 02/10/18 1430 02/11/18 0611  Weight: 140 lb (63.5 kg) 136 lb 14.5 oz (62.1 kg) 137 lb 3.2 oz (62.2 kg)    Telemetry    SR - Personally Reviewed  ECG    N/A  Physical Exam   GEN: Elderly frail male in no acute distress.   Neck: No JVD Cardiac: RRR, no murmurs, rubs, or gallops. R radial cath site without hematoma.  Respiratory: Clear to auscultation bilaterally. GI: Soft, nontender, non-distended  MS: No edema; No deformity. Neuro:  Nonfocal  Psych: Normal affect   Labs    Hematology Recent Labs  Lab 02/11/18 0421  WBC 6.6  RBC 4.49  HGB 13.5  HCT 40.3  MCV 89.8  MCH 30.1  MCHC 33.5  RDW 13.9  PLT 95*    Radiology    No results found.  Cardiac Studies   LEFT HEART CATH AND CORONARY ANGIOGRAPHY  02/10/2018  Conclusion    Severe three-vessel coronary artery disease  Total occlusion of the mid LAD with left  to left collaterals.  95% proximal LAD before a large diagonal branch.  The first diagonal contains 50% proximal narrowing.  Ramus intermedius with 50 to 60% mid vessel stenosis.  Large first obtuse marginal contains mid segment diffuse disease up to 70%.  RCA is totally occluded proximally.  Distal vessel fills by  left to right collaterals.  Left ventricular systolic dysfunction with apical akinesis/dyskinesis, EF 35 to 45%.  Normal LVEDP.  RECOMMENDATIONS:   The patient has severe multivessel coronary disease with chronic total occlusion of the mid LAD and proximal RCA.  In addition there is further proximal LAD disease before a large diagonal and diffuse disease in both a large ramus intermedius and first obtuse marginal.  Should be considered for surgical revascularization.  He has significant other comorbidities.  We will get TCTS consultation to determine if he is a surgical candidate.  Further management will be dependent upon surgical risk and appropriateness for surgery.  Admit to telemetry  Start long-acting nitrates  Continue aspirin  IV heparin   Diagnostic Diagram       Echo 12/25/17 Study Conclusions  - Left ventricle: Distal septal / apical hypokinesis. The cavity size was mildly dilated.  Wall thickness was increased in a pattern of mild LVH. Systolic function was mildly reduced. The estimated ejection fraction was in the range of 45% to 50%. Doppler parameters are consistent with abnormal left ventricular relaxation (grade 1 diastolic dysfunction). - Aortic valve: There was mild regurgitation. Valve area (VTI): 1.61 cm^2. Valve area (Vmax): 1.67 cm^2. Valve area (Vmean): 1.66 cm^2. - Mitral valve: There was mild regurgitation. - Left atrium: The atrium was moderately dilated. - Atrial septum: No defect or patent foramen ovale was identified. - Pulmonary arteries: PA peak pressure: 35 mm Hg (S).   Patient Profile     83 y.o. male with  past medical history of chronic combined systolic and diastolic CHF (EF 69-62% by echo in 12/2017), HTN, HLD, carotid artery stenosis, AAA (4.1cm by imaging in 12/2017), COPD, and recent CVA (on 12/2017) and recent abnormal stress test (medium size, severe intensity, periapical defect which was partially really reversible and consistent with an infarct scar and mild peri-infarct ischemia) presented for outpatient cath.  Assessment & Plan    1. CAD - Cath showed severe 3 V disease as noted above. Seen by Dr. Roxy Manns who did not "recommended CABG at this time given recent embolic strokes, severely diseased ascending thoracic aorta, and underlying severe COPD. Recommend medical treatment for management of the patient's ischemic heart disease with or without consideration of possible PCI and stenting of the proximal left anterior descending coronary artery and diagonal branch". - Continue ASA, Imdur and IV heparin. Will restart home statin and coreg.   2. Chronic Combined Systolic and Diastolic CHF - Recent echocardiogram showed his EF was mildly reduced at 45 to 50%.  He has chronic dyspnea on exertion which has acutely worsened over the past several months but denies any recent orthopnea, PND, or lower extremity edema. - Resume Carvedilol.    Check BEMT and resume Lisinopril and lasix 40mg  qd if ok and no intervention planned.   3. HTN - BP is well controlled. Resume meds as above.   4. HLD - 12/25/2017: Cholesterol 192; HDL 25; LDL Cholesterol 139; Triglycerides 139; VLDL 28 He has been started on Atorvastatin 40 mg daily. Will repeat lab.   5. Carotid Artery Stenosis - Carotid Dopplers in 95/2841 showed LICA stenosis greater than 70% and RICA stenosis at 50-69%.  - Scheduled to follow-up with vascular surgery later this month.  It appears he was also started on Eliquis during his recent hospitalization at the recommendations of Dr. Bridgett Larsson. Eliquis was hold for possible intervention. On Heparin.    7. Recent CVA - Denies any residual neurological deficits. Remains on ASA and statin therapy.  8. Mild ascending aorta aneurysm - Dr. Roxy Manns felt he did not needed any surveillance of replacement.    For questions or updates, please contact [facility] Please consult [email or web address] for contact info under Cardiology/STEMI.      SignedLeanor Kail, PA  02/11/2018, 11:08 AM

## 2018-02-11 NOTE — Progress Notes (Signed)
[place] for heparin Indication: chest pain/ACS, ?CVA  Heparin Dosing Weight: 62.1 kg  Labs: Recent Labs    02/10/18 1544 02/11/18 0421 02/11/18 1131 02/11/18 1540  HGB  --  13.5  --   --   HCT  --  40.3  --   --   PLT  --  95*  --   --   APTT 35 54*  --  69*  HEPARINUNFRC 0.17* 0.22*  --   --   CREATININE  --   --  1.17  --     Assessment: 17 yom s/p cath 7/9 after presenting with abnormal stress test/dyspnea on exertion. Cath showed 3V CAD and CVTS consulted. Patient on apixaban PTA for ?CVA. Pharmacy consulted to start heparin 8 hours post-sheath removal for ACS.  -aPTT= 69 and at goal after heparin increased to 1050 units/hr  Goal of Therapy:  Heparin level 0.3-0.7 units/ml Monitor platelets by anticoagulation protocol: Yes   Plan:  -No heparin changes needed -Daily heparin level and CBC  Thank you for allowing pharmacy to be a part of this patient's care.  Hildred Laser, PharmD Clinical Pharmacist Please check Amion for pharmacy contact number  02/11/2018 5:20 PM

## 2018-02-11 NOTE — Progress Notes (Signed)
[place] for heparin Indication: chest pain/ACS, ?CVA  Heparin Dosing Weight: 62.1 kg  Labs: Recent Labs    02/10/18 1544 02/11/18 0421  HGB  --  13.5  HCT  --  40.3  PLT  --  PENDING  APTT 35 54*  HEPARINUNFRC 0.17* 0.22*    Assessment: 35 yom s/p cath 7/9 after presenting with abnormal stress test/dyspnea on exertion. Cath showed 3V CAD and CVTS consulted. Patient on apixaban PTA for ?CVA. Pharmacy consulted to start heparin 8 hours post-sheath removal for ACS.   aPTT this morning is SUBtherapeutic (aPTT 54, goal of 66-102) CBC wnl - no bleeding noted.   Goal of Therapy:  Heparin level 0.3-0.7 units/ml Monitor platelets by anticoagulation protocol: Yes   Plan:  - Increase Heparin to 1050 units/hr (10.5 ml/hr) - Will continue to monitor for any signs/symptoms of bleeding and will follow up with aPTT level in 8 hours   Thank you for allowing pharmacy to be a part of this patient's care.  Alycia Rossetti, PharmD, BCPS Clinical Pharmacist Pager: [phone] If after 3:30p, please call main pharmacy at: [phone] 02/11/2018 6:50 AM

## 2018-02-12 ENCOUNTER — Inpatient Hospital Stay (HOSPITAL_COMMUNITY)
Admission: RE | Disposition: A | Discharge status: Home / Self Care | Payer: Self-pay | Source: Ambulatory Visit | Attending: Interventional Cardiology

## 2018-02-12 DIAGNOSIS — Z720 Tobacco use: Secondary | ICD-10-CM

## 2018-02-12 HISTORY — PX: CORONARY STENT INTERVENTION: CATH118234

## 2018-02-12 LAB — CBC
HEMATOCRIT: 41.7 % (ref 39.0–52.0)
Hemoglobin: 13.8 g/dL (ref 13.0–17.0)
MCH: 29.5 pg (ref 26.0–34.0)
MCHC: 33.1 g/dL (ref 30.0–36.0)
MCV: 89.1 fL (ref 78.0–100.0)
PLATELETS: 82 10*3/uL — AB (ref 150–400)
RBC: 4.68 MIL/uL (ref 4.22–5.81)
RDW: 13.8 % (ref 11.5–15.5)
WBC: 6 10*3/uL (ref 4.0–10.5)

## 2018-02-12 LAB — BASIC METABOLIC PANEL
ANION GAP: 9 (ref 5–15)
BUN: 14 mg/dL (ref 8–23)
CHLORIDE: 110 mmol/L (ref 98–111)
CO2: 23 mmol/L (ref 22–32)
Calcium: 8.9 mg/dL (ref 8.9–10.3)
Creatinine, Ser: 1.19 mg/dL (ref 0.61–1.24)
GFR calc Af Amer: >60 mL/min (ref >60)
GFR calc non Af Amer: 55 mL/min — ABNORMAL LOW (ref >60)
Glucose, Bld: 92 mg/dL (ref 70–99)
POTASSIUM: 4.1 mmol/L (ref 3.5–5.1)
Sodium: 142 mmol/L (ref 135–145)

## 2018-02-12 LAB — LIPID PANEL
CHOL/HDL RATIO: 5.2 ratio
Cholesterol: 110 mg/dL (ref 0–200)
HDL: 21 mg/dL — AB (ref >40)
LDL Cholesterol: 58 mg/dL (ref 0–99)
TRIGLYCERIDES: 155 mg/dL — AB (ref <150)
VLDL: 31 mg/dL (ref 0–40)

## 2018-02-12 LAB — POCT ACTIVATED CLOTTING TIME
ACTIVATED CLOTTING TIME: 274 s
ACTIVATED CLOTTING TIME: 329 s

## 2018-02-12 LAB — HEPARIN LEVEL (UNFRACTIONATED): Heparin Unfractionated: 0.39 IU/mL (ref 0.30–0.70)

## 2018-02-12 LAB — APTT: aPTT: 79 seconds — ABNORMAL HIGH (ref 24–36)

## 2018-02-12 SURGERY — CORONARY STENT INTERVENTION
Anesthesia: LOCAL

## 2018-02-12 MED ORDER — HEPARIN SODIUM (PORCINE) 5000 UNIT/ML IJ SOLN
5000.0000 [IU] | Freq: Three times a day (TID) | INTRAMUSCULAR | Status: DC
  Administered 2018-02-13: 07:00:00 5000 [IU] via SUBCUTANEOUS
  Filled 2018-02-12: qty 1

## 2018-02-12 MED ORDER — IOHEXOL 350 MG/ML SOLN
INTRAVENOUS | Status: DC | PRN
  Administered 2018-02-12: 140 mL via INTRA_ARTERIAL

## 2018-02-12 MED ORDER — HEPARIN (PORCINE) IN NACL 1000-0.9 UT/500ML-% IV SOLN
INTRAVENOUS | Status: DC | PRN
  Administered 2018-02-12 (×2): 500 mL

## 2018-02-12 MED ORDER — CLOPIDOGREL BISULFATE 300 MG PO TABS
ORAL_TABLET | ORAL | Status: DC | PRN
  Administered 2018-02-12: 300 mg via ORAL

## 2018-02-12 MED ORDER — CLOPIDOGREL BISULFATE 75 MG PO TABS
75.0000 mg | ORAL_TABLET | Freq: Every day | ORAL | Status: DC
  Administered 2018-02-13: 11:00:00 75 mg via ORAL
  Filled 2018-02-12: qty 1

## 2018-02-12 MED ORDER — LIDOCAINE HCL (PF) 1 % IJ SOLN
INTRAMUSCULAR | Status: DC | PRN
  Administered 2018-02-12: 5 mL

## 2018-02-12 MED ORDER — HEPARIN (PORCINE) IN NACL 1000-0.9 UT/500ML-% IV SOLN
INTRAVENOUS | Status: AC
  Filled 2018-02-12: qty 1000

## 2018-02-12 MED ORDER — HEPARIN SODIUM (PORCINE) 1000 UNIT/ML IJ SOLN
INTRAMUSCULAR | Status: AC
  Filled 2018-02-12: qty 1

## 2018-02-12 MED ORDER — CLOPIDOGREL BISULFATE 75 MG PO TABS
300.0000 mg | ORAL_TABLET | Freq: Once | ORAL | Status: AC
  Administered 2018-02-12: 300 mg via ORAL
  Filled 2018-02-12: qty 4

## 2018-02-12 MED ORDER — SODIUM CHLORIDE 0.9 % IV SOLN: 250.0000 mL | INTRAVENOUS | Status: DC | PRN

## 2018-02-12 MED ORDER — SODIUM CHLORIDE 0.9 % IV SOLN
INTRAVENOUS | Status: AC
  Administered 2018-02-12: 18:00:00 via INTRAVENOUS

## 2018-02-12 MED ORDER — VERAPAMIL HCL 2.5 MG/ML IV SOLN
INTRAVENOUS | Status: AC
  Filled 2018-02-12: qty 2

## 2018-02-12 MED ORDER — OXYCODONE HCL 5 MG PO TABS: 5.0000 mg | ORAL_TABLET | ORAL | Status: DC | PRN

## 2018-02-12 MED ORDER — ASPIRIN 81 MG PO CHEW
81.0000 mg | CHEWABLE_TABLET | Freq: Every day | ORAL | Status: DC
  Administered 2018-02-13: 11:00:00 81 mg via ORAL
  Filled 2018-02-12: qty 1

## 2018-02-12 MED ORDER — NITROGLYCERIN 1 MG/10 ML FOR IR/CATH LAB
INTRA_ARTERIAL | Status: DC | PRN
  Administered 2018-02-12: 200 ug via INTRACORONARY

## 2018-02-12 MED ORDER — NITROGLYCERIN 1 MG/10 ML FOR IR/CATH LAB
INTRA_ARTERIAL | Status: AC
  Filled 2018-02-12: qty 10

## 2018-02-12 MED ORDER — TIOTROPIUM BROMIDE MONOHYDRATE 18 MCG IN CAPS
18.0000 ug | ORAL_CAPSULE | Freq: Every day | RESPIRATORY_TRACT | Status: DC
  Administered 2018-02-12: 18 ug via RESPIRATORY_TRACT
  Filled 2018-02-12: qty 5

## 2018-02-12 MED ORDER — CLOPIDOGREL BISULFATE 300 MG PO TABS
ORAL_TABLET | ORAL | Status: AC
  Filled 2018-02-12: qty 1

## 2018-02-12 MED ORDER — ATORVASTATIN CALCIUM 80 MG PO TABS
80.0000 mg | ORAL_TABLET | Freq: Every day | ORAL | Status: DC
  Administered 2018-02-12: 21:00:00 80 mg via ORAL
  Filled 2018-02-12 (×2): qty 1

## 2018-02-12 MED ORDER — ANGIOPLASTY BOOK
Freq: Once | Status: DC
  Filled 2018-02-12: qty 1

## 2018-02-12 MED ORDER — FENTANYL CITRATE (PF) 100 MCG/2ML IJ SOLN
INTRAMUSCULAR | Status: AC
  Filled 2018-02-12: qty 2

## 2018-02-12 MED ORDER — LABETALOL HCL 5 MG/ML IV SOLN: 10.0000 mg | INTRAVENOUS | Status: AC | PRN

## 2018-02-12 MED ORDER — ONDANSETRON HCL 4 MG/2ML IJ SOLN: 4.0000 mg | Freq: Four times a day (QID) | INTRAMUSCULAR | Status: DC | PRN

## 2018-02-12 MED ORDER — HYDRALAZINE HCL 20 MG/ML IJ SOLN: 5.0000 mg | INTRAMUSCULAR | Status: AC | PRN

## 2018-02-12 MED ORDER — SODIUM CHLORIDE 0.9% FLUSH
3.0000 mL | Freq: Two times a day (BID) | INTRAVENOUS | Status: DC
  Administered 2018-02-12: 3 mL via INTRAVENOUS

## 2018-02-12 MED ORDER — MIDAZOLAM HCL 2 MG/2ML IJ SOLN
INTRAMUSCULAR | Status: DC | PRN
  Administered 2018-02-12 (×2): 0.5 mg via INTRAVENOUS

## 2018-02-12 MED ORDER — SODIUM CHLORIDE 0.9% FLUSH: 3.0000 mL | INTRAVENOUS | Status: DC | PRN

## 2018-02-12 MED ORDER — LIDOCAINE HCL (PF) 1 % IJ SOLN
INTRAMUSCULAR | Status: AC
  Filled 2018-02-12: qty 30

## 2018-02-12 MED ORDER — MIDAZOLAM HCL 2 MG/2ML IJ SOLN
INTRAMUSCULAR | Status: AC
  Filled 2018-02-12: qty 2

## 2018-02-12 MED ORDER — HEPARIN SODIUM (PORCINE) 1000 UNIT/ML IJ SOLN
INTRAMUSCULAR | Status: DC | PRN
  Administered 2018-02-12: 6000 [IU] via INTRAVENOUS
  Administered 2018-02-12: 1500 [IU] via INTRAVENOUS

## 2018-02-12 MED ORDER — FENTANYL CITRATE (PF) 100 MCG/2ML IJ SOLN
INTRAMUSCULAR | Status: DC | PRN
  Administered 2018-02-12 (×2): 25 ug via INTRAVENOUS

## 2018-02-12 MED ORDER — VERAPAMIL HCL 2.5 MG/ML IV SOLN
INTRAVENOUS | Status: DC | PRN
  Administered 2018-02-12: 10 mL via INTRA_ARTERIAL

## 2018-02-12 MED ORDER — ACETAMINOPHEN 325 MG PO TABS
650.0000 mg | ORAL_TABLET | ORAL | Status: DC | PRN
  Administered 2018-02-12: 650 mg via ORAL
  Filled 2018-02-12: qty 2

## 2018-02-12 SURGICAL SUPPLY — 17 items
BALLN SAPPHIRE 2.0X12 (BALLOONS) ×2
BALLN SAPPHIRE ~~LOC~~ 3.5X12 (BALLOONS) ×2 IMPLANT
BALLOON SAPPHIRE 2.0X12 (BALLOONS) ×1 IMPLANT
CATH VISTA GUIDE 6FR XBLAD3.5 (CATHETERS) ×2 IMPLANT
COVER PRB 48X5XTLSCP FOLD TPE (BAG) ×1 IMPLANT
COVER PROBE 5X48 (BAG) ×1
DEVICE RAD COMP TR BAND LRG (VASCULAR PRODUCTS) ×2 IMPLANT
GLIDESHEATH SLEND A-KIT 6F 22G (SHEATH) ×2 IMPLANT
GUIDEWIRE INQWIRE 1.5J.035X260 (WIRE) ×1 IMPLANT
INQWIRE 1.5J .035X260CM (WIRE) ×2
KIT ENCORE 26 ADVANTAGE (KITS) ×2 IMPLANT
KIT HEART LEFT (KITS) ×2 IMPLANT
PACK CARDIAC CATHETERIZATION (CUSTOM PROCEDURE TRAY) ×2 IMPLANT
STENT SYNERGY DES 3X16 (Permanent Stent) ×2 IMPLANT
TRANSDUCER W/STOPCOCK (MISCELLANEOUS) ×2 IMPLANT
TUBING CIL FLEX 10 FLL-RA (TUBING) ×2 IMPLANT
WIRE ASAHI PROWATER 180CM (WIRE) ×2 IMPLANT

## 2018-02-12 NOTE — Interval H&P Note (Signed)
History and Physical Interval Note:  02/12/2018 4:10 PM   Patient has severe multivessel coronary disease with total occlusion of the proximal/ostial RCA, total occlusion of the mid LAD and 95% proximal circumflex before the origin of the large diagonal, and moderate diffuse disease involving to obtuse marginal branches.  He has LV systolic dysfunction.  He has been turned down for surgery.  The treating team is referred him for PCI.  Approachable lesion PCI is the stenosis in proximal LAD which supplies collaterals to the mid and distal LAD.  I doubt CTO of RCA is feasible.  There is possibility that mid LAD CTO could be approached.  PCI on the proximal LAD would be at increased risk because of LV dysfunction and diffuse coronary disease.  Diagonal supplies collaterals to the distal LAD segment placing the entire anterior wall at risk.  Daniel Simon  has presented today for surgery, with the diagnosis of cad  The various methods of treatment have been discussed with the patient and family. After consideration of risks, benefits and other options for treatment, the patient has consented to  Procedure(s): CORONARY STENT INTERVENTION (N/A) as a surgical intervention .  The patient's history has been reviewed, patient examined, no change in status, stable for surgery.  I have reviewed the patient's chart and labs.  Questions were answered to the patient's satisfaction.     [name]

## 2018-02-12 NOTE — Progress Notes (Signed)
Progress Note  Patient Name: Daniel Simon Date of Encounter: 02/12/2018  Primary Cardiologist: Daniel Dolly, MD   Subjective   Feels well.  No chest pain or shortness of breath.   Inpatient Medications    Scheduled Meds: . [START ON 02/13/2018] aspirin  81 mg Oral Daily  . atorvastatin  80 mg Oral q1800  . carvedilol  6.25 mg Oral BID WC  . clopidogrel  300 mg Oral Once  . isosorbide mononitrate  30 mg Oral Daily  . sodium chloride flush  3 mL Intravenous Q12H  . sodium chloride flush  3 mL Intravenous Q12H   Continuous Infusions: . sodium chloride    . sodium chloride    . sodium chloride 1 mL/kg/hr (02/11/18 2135)  . heparin 1,050 Units/hr (02/12/18 0640)   PRN Meds: sodium chloride, sodium chloride, acetaminophen, ondansetron (ZOFRAN) IV, oxyCODONE, sodium chloride flush, sodium chloride flush   Vital Signs    Vitals:   02/11/18 1228 02/11/18 1938 02/12/18 0106 02/12/18 0536  BP: 113/62 (!) 125/54 (!) 157/72 (!) 147/69  Pulse: (!) 59 62 82 68  Resp: 18 20 18 18   Temp: 98.2 F (36.8 C) 98.3 F (36.8 C) 98.8 F (37.1 C) 98.4 F (36.9 C)  TempSrc: Oral Oral Oral Oral  SpO2: 91% 95% 94% 92%  Weight:    135 lb 12.8 oz (61.6 kg)  Height:        Intake/Output Summary (Last 24 hours) at 02/12/2018 0935 Last data filed at 02/12/2018 0640 Gross per 24 hour  Intake 1049 ml  Output -  Net 1049 ml   Filed Weights   02/10/18 1430 02/11/18 0611 02/12/18 0536  Weight: 136 lb 14.5 oz (62.1 kg) 137 lb 3.2 oz (62.2 kg) 135 lb 12.8 oz (61.6 kg)    Telemetry    Sinus rhythm.  PVCs.  Ventricular trigeminy.  - Personally Reviewed  ECG    n/a - Personally Reviewed  Physical Exam   VS:  BP (!) 147/69 (BP Location: Left Arm)   Pulse 68   Temp 98.4 F (36.9 C) (Oral)   Resp 18   Ht 5\' 7"  (1.702 m)   Wt 135 lb 12.8 oz (61.6 kg) Comment: scale b  SpO2 92%   BMI 21.27 kg/m  , BMI Body mass index is 21.27 kg/m. GENERAL:  Thin.  Chronically  ill-appearing HEENT: Pupils equal round and reactive, fundi not visualized, oral mucosa unremarkable NECK:  No jugular venous distention, waveform within normal limits, carotid upstroke brisk and symmetric, no bruits LUNGS:  Diffuse rhonchi.  Poor air movement.  HEART:  RRR.  PMI not displaced or sustained,S1 and S2 within normal limits, no S3, no S4, no clicks, no rubs, no murmurs ABD:  Flat, positive bowel sounds normal in frequency in pitch, no bruits, no rebound, no guarding, no midline pulsatile mass, no hepatomegaly, no splenomegaly EXT:  2 plus pulses throughout, no edema, no cyanosis no clubbing SKIN:  No rashes no nodules NEURO:  Cranial nerves II through XII grossly intact, motor grossly intact throughout PSYCH:  Cognitively intact, oriented to person place and time  Labs    Chemistry Recent Labs  Lab 02/11/18 1131 02/12/18 0600  NA 142 142  K 4.3 4.1  CL 113* 110  CO2 23 23  GLUCOSE 100* 92  BUN 18 14  CREATININE 1.17 1.19  CALCIUM 8.9 8.9  GFRNONAA 57* 55*  GFRAA >60 >60  ANIONGAP 6 9     Hematology Recent Labs  Lab 02/11/18 0421 02/12/18 0600  WBC 6.6 6.0  RBC 4.49 4.68  HGB 13.5 13.8  HCT 40.3 41.7  MCV 89.8 89.1  MCH 30.1 29.5  MCHC 33.5 33.1  RDW 13.9 13.8  PLT 95* 82*    Cardiac EnzymesNo results for input(s): TROPONINI in the last 168 hours. No results for input(s): TROPIPOC in the last 168 hours.   BNPNo results for input(s): BNP, PROBNP in the last 168 hours.   DDimer No results for input(s): DDIMER in the last 168 hours.   Radiology    No results found.  Cardiac Studies   LEFT HEART CATH AND CORONARY ANGIOGRAPHY  02/10/2018  Conclusion    Severe three-vessel coronary artery disease  Total occlusion of the mid LAD with left to left collaterals. 95% proximal LAD before a large diagonal branch. The first diagonal contains 50% proximal narrowing.  Ramus intermedius with 50 to 60% mid vessel stenosis.  Large first obtuse marginal  contains mid segment diffuse disease up to 70%.  RCA is totally occluded proximally. Distal vessel fills by left to right collaterals.  Left ventricular systolic dysfunction with apical akinesis/dyskinesis, EF 35 to 45%. Normal LVEDP.  RECOMMENDATIONS:   The patient has severe multivessel coronary disease with chronic total occlusion of the mid LAD and proximal RCA. In addition there is further proximal LAD disease before a large diagonal and diffuse disease in both a large ramus intermedius and first obtuse marginal. Should be considered for surgical revascularization. He has significant other comorbidities. We will get TCTS consultation to determine if he is a surgical candidate. Further management will be dependent upon surgical risk and appropriateness for surgery.  Admit to telemetry  Start long-acting nitrates  Continue aspirin  IV heparin   Diagnostic Diagram       Echo 12/25/17 Study Conclusions  - Left ventricle: Distal septal / apical hypokinesis. The cavity size was mildly dilated. Wall thickness was increased in a pattern of mild LVH. Systolic function was mildly reduced. The estimated ejection fraction was in the range of 45% to 50%. Doppler parameters are consistent with abnormal left ventricular relaxation (grade 1 diastolic dysfunction). - Aortic valve: There was mild regurgitation. Valve area (VTI): 1.61 cm^2. Valve area (Vmax): 1.67 cm^2. Valve area (Vmean): 1.66 cm^2. - Mitral valve: There was mild regurgitation. - Left atrium: The atrium was moderately dilated. - Atrial septum: No defect or patent foramen ovale was identified. - Pulmonary arteries: PA peak pressure: 35 mm Hg (S).    Patient Profile     Mr. Daniel Simon is an 75M with chronic systolic and diastolic heart failure, severe COPD, recent embolic stroke, hyperlipidemia, carotid stenosis, ascendingl aortic aneurysm, atherosclerosis of the aorta, and ongoing tobacco abuse  here with exertional dyspnea and found to have severe 3 vessel CAD. He has 100% RCA occlusion with L-->R collaterals.  He also has severe proximal LAD and OM disease  Assessment & Plan    # Severe, 3 vessel obstructive CAD:  Not a candidate for CABG.  He will undergo LAD PCI today.  Continue aspirin, atorvastatin, and carvedilol.  Atorvastatin was increased this admission.  He will need repeat lipids and CMP in 6-8 weeks.  He will start clopidogrel today.  Imdur also new this admission.   # Chronic systolic and diastolic heart failure:  NYHA III.  LVEF 45-50% by echo 12/2017 and 35-45% by cath this admission.  He is euvolemic on exam.  Continue carvedilol and lisinopril.  # COPD: Smoking cessation advised.  Unclear why home Spiriva has been held.  We will reorder.   # Carotid stenosis: 70% bilateral carotid disease.  ASA, clopidogrel and statin as above.    # Ascending aorta aneurysm: Mild.  No serial monitoring as he isn't a candidate for surgery.  For questions or updates, please contact [place] Please consult [email or web address] for contact info under Cardiology/STEMI.      Signed, Skeet Latch, MD  02/12/2018, 9:35 AM

## 2018-02-12 NOTE — Progress Notes (Signed)
[place] for heparin Indication: chest pain/ACS, ?CVA  Heparin Dosing Weight: 62.1 kg  Labs: Recent Labs    02/10/18 1544 02/11/18 0421 02/11/18 1131 02/11/18 1540 02/12/18 0600  HGB  --  13.5  --   --  13.8  HCT  --  40.3  --   --  41.7  PLT  --  95*  --   --  82*  APTT 35 54*  --  69* 79*  HEPARINUNFRC 0.17* 0.22*  --   --  0.39  CREATININE  --   --  1.17  --  1.19    Assessment: 5 yom s/p cath 7/9 after presenting with abnormal stress test/dyspnea on exertion. Cath showed 3V CAD. Patient on apixaban PTA for ?CVA. Pharmacy consulted to start heparin post-cath for ACS. CVTS consulted - not a CABG candidate. Cards planning for LHC of LAD 7/11. Heparin level and aPTT remain therapeutic and appear to be correlating - will follow heparin levels only.  Goal of Therapy:  Heparin level 0.3-0.7 units/ml Monitor platelets by anticoagulation protocol: Yes   Plan:  -Continue heparin at 1050 units/hr -Daily heparin level and CBC -LHC of LAD 7/11, f/u resume apixaban post-cath  Elicia Lamp, PharmD, BCPS Clinical Pharmacist Clinical phone [phone] Please check AMION for all [place] contact numbers 02/12/2018 8:35 AM

## 2018-02-12 NOTE — H&P (View-Only) (Signed)
Progress Note  Patient Name: Daniel Simon Date of Encounter: 02/12/2018  Primary Cardiologist: Carlyle Dolly, MD   Subjective   Feels well.  No chest pain or shortness of breath.   Inpatient Medications    Scheduled Meds: . [START ON 02/13/2018] aspirin  81 mg Oral Daily  . atorvastatin  80 mg Oral q1800  . carvedilol  6.25 mg Oral BID WC  . clopidogrel  300 mg Oral Once  . isosorbide mononitrate  30 mg Oral Daily  . sodium chloride flush  3 mL Intravenous Q12H  . sodium chloride flush  3 mL Intravenous Q12H   Continuous Infusions: . sodium chloride    . sodium chloride    . sodium chloride 1 mL/kg/hr (02/11/18 2135)  . heparin 1,050 Units/hr (02/12/18 0640)   PRN Meds: sodium chloride, sodium chloride, acetaminophen, ondansetron (ZOFRAN) IV, oxyCODONE, sodium chloride flush, sodium chloride flush   Vital Signs    Vitals:   02/11/18 1228 02/11/18 1938 02/12/18 0106 02/12/18 0536  BP: 113/62 (!) 125/54 (!) 157/72 (!) 147/69  Pulse: (!) 59 62 82 68  Resp: 18 20 18 18   Temp: 98.2 F (36.8 C) 98.3 F (36.8 C) 98.8 F (37.1 C) 98.4 F (36.9 C)  TempSrc: Oral Oral Oral Oral  SpO2: 91% 95% 94% 92%  Weight:    135 lb 12.8 oz (61.6 kg)  Height:        Intake/Output Summary (Last 24 hours) at 02/12/2018 0935 Last data filed at 02/12/2018 0640 Gross per 24 hour  Intake 1049 ml  Output -  Net 1049 ml   Filed Weights   02/10/18 1430 02/11/18 0611 02/12/18 0536  Weight: 136 lb 14.5 oz (62.1 kg) 137 lb 3.2 oz (62.2 kg) 135 lb 12.8 oz (61.6 kg)    Telemetry    Sinus rhythm.  PVCs.  Ventricular trigeminy.  - Personally Reviewed  ECG    n/a - Personally Reviewed  Physical Exam   VS:  BP (!) 147/69 (BP Location: Left Arm)   Pulse 68   Temp 98.4 F (36.9 C) (Oral)   Resp 18   Ht 5\' 7"  (1.702 m)   Wt 135 lb 12.8 oz (61.6 kg) Comment: scale b  SpO2 92%   BMI 21.27 kg/m  , BMI Body mass index is 21.27 kg/m. GENERAL:  Thin.  Chronically  ill-appearing HEENT: Pupils equal round and reactive, fundi not visualized, oral mucosa unremarkable NECK:  No jugular venous distention, waveform within normal limits, carotid upstroke brisk and symmetric, no bruits LUNGS:  Diffuse rhonchi.  Poor air movement.  HEART:  RRR.  PMI not displaced or sustained,S1 and S2 within normal limits, no S3, no S4, no clicks, no rubs, no murmurs ABD:  Flat, positive bowel sounds normal in frequency in pitch, no bruits, no rebound, no guarding, no midline pulsatile mass, no hepatomegaly, no splenomegaly EXT:  2 plus pulses throughout, no edema, no cyanosis no clubbing SKIN:  No rashes no nodules NEURO:  Cranial nerves II through XII grossly intact, motor grossly intact throughout PSYCH:  Cognitively intact, oriented to person place and time  Labs    Chemistry Recent Labs  Lab 02/11/18 1131 02/12/18 0600  NA 142 142  K 4.3 4.1  CL 113* 110  CO2 23 23  GLUCOSE 100* 92  BUN 18 14  CREATININE 1.17 1.19  CALCIUM 8.9 8.9  GFRNONAA 57* 55*  GFRAA >60 >60  ANIONGAP 6 9     Hematology Recent Labs  Lab 02/11/18 0421 02/12/18 0600  WBC 6.6 6.0  RBC 4.49 4.68  HGB 13.5 13.8  HCT 40.3 41.7  MCV 89.8 89.1  MCH 30.1 29.5  MCHC 33.5 33.1  RDW 13.9 13.8  PLT 95* 82*    Cardiac EnzymesNo results for input(s): TROPONINI in the last 168 hours. No results for input(s): TROPIPOC in the last 168 hours.   BNPNo results for input(s): BNP, PROBNP in the last 168 hours.   DDimer No results for input(s): DDIMER in the last 168 hours.   Radiology    No results found.  Cardiac Studies   LEFT HEART CATH AND CORONARY ANGIOGRAPHY  02/10/2018  Conclusion    Severe three-vessel coronary artery disease  Total occlusion of the mid LAD with left to left collaterals. 95% proximal LAD before a large diagonal branch. The first diagonal contains 50% proximal narrowing.  Ramus intermedius with 50 to 60% mid vessel stenosis.  Large first obtuse marginal  contains mid segment diffuse disease up to 70%.  RCA is totally occluded proximally. Distal vessel fills by left to right collaterals.  Left ventricular systolic dysfunction with apical akinesis/dyskinesis, EF 35 to 45%. Normal LVEDP.  RECOMMENDATIONS:   The patient has severe multivessel coronary disease with chronic total occlusion of the mid LAD and proximal RCA. In addition there is further proximal LAD disease before a large diagonal and diffuse disease in both a large ramus intermedius and first obtuse marginal. Should be considered for surgical revascularization. He has significant other comorbidities. We will get TCTS consultation to determine if he is a surgical candidate. Further management will be dependent upon surgical risk and appropriateness for surgery.  Admit to telemetry  Start long-acting nitrates  Continue aspirin  IV heparin   Diagnostic Diagram       Echo 12/25/17 Study Conclusions  - Left ventricle: Distal septal / apical hypokinesis. The cavity size was mildly dilated. Wall thickness was increased in a pattern of mild LVH. Systolic function was mildly reduced. The estimated ejection fraction was in the range of 45% to 50%. Doppler parameters are consistent with abnormal left ventricular relaxation (grade 1 diastolic dysfunction). - Aortic valve: There was mild regurgitation. Valve area (VTI): 1.61 cm^2. Valve area (Vmax): 1.67 cm^2. Valve area (Vmean): 1.66 cm^2. - Mitral valve: There was mild regurgitation. - Left atrium: The atrium was moderately dilated. - Atrial septum: No defect or patent foramen ovale was identified. - Pulmonary arteries: PA peak pressure: 35 mm Hg (S).    Patient Profile     Mr. Mah is an 77M with chronic systolic and diastolic heart failure, severe COPD, recent embolic stroke, hyperlipidemia, carotid stenosis, ascendingl aortic aneurysm, atherosclerosis of the aorta, and ongoing tobacco abuse  here with exertional dyspnea and found to have severe 3 vessel CAD. He has 100% RCA occlusion with L-->R collaterals.  He also has severe proximal LAD and OM disease  Assessment & Plan    # Severe, 3 vessel obstructive CAD:  Not a candidate for CABG.  He will undergo LAD PCI today.  Continue aspirin, atorvastatin, and carvedilol.  Atorvastatin was increased this admission.  He will need repeat lipids and CMP in 6-8 weeks.  He will start clopidogrel today.  Imdur also new this admission.   # Chronic systolic and diastolic heart failure:  NYHA III.  LVEF 45-50% by echo 12/2017 and 35-45% by cath this admission.  He is euvolemic on exam.  Continue carvedilol and lisinopril.  # COPD: Smoking cessation advised.  Unclear why home Spiriva has been held.  We will reorder.   # Carotid stenosis: 70% bilateral carotid disease.  ASA, clopidogrel and statin as above.    # Ascending aorta aneurysm: Mild.  No serial monitoring as he isn't a candidate for surgery.  For questions or updates, please contact [place] Please consult [email or web address] for contact info under Cardiology/STEMI.      Signed, Skeet Latch, MD  02/12/2018, 9:35 AM

## 2018-02-12 NOTE — Interval H&P Note (Signed)
History and Physical Interval Note:  02/12/2018 4:09 PM   Cath Lab Visit (complete for each Cath Lab visit)  Clinical Evaluation Leading to the Procedure:   ACS: No.  Non-ACS:    Anginal Classification: CCS Simon  Anti-ischemic medical therapy: Minimal Therapy (1 class of medications)  Non-Invasive Test Results: No non-invasive testing performed  Prior CABG: No previous CABG        Daniel Simon  has presented today for surgery, with the diagnosis of cad  The various methods of treatment have been discussed with the patient and family. After consideration of risks, benefits and other options for treatment, the patient has consented to  Procedure(s): CORONARY STENT INTERVENTION (N/A) as a surgical intervention .  The patient's history has been reviewed, patient examined, no change in status, stable for surgery.  I have reviewed the patient's chart and labs.  Questions were answered to the patient's satisfaction.     Daniel Simon

## 2018-02-13 ENCOUNTER — Telehealth: Payer: Self-pay | Admitting: Cardiology

## 2018-02-13 ENCOUNTER — Encounter (HOSPITAL_COMMUNITY)
Admission: RE | Disposition: A | Discharge status: Home / Self Care | Payer: Self-pay | Source: Ambulatory Visit | Attending: Interventional Cardiology

## 2018-02-13 ENCOUNTER — Encounter (HOSPITAL_COMMUNITY): Payer: Self-pay | Admitting: Interventional Cardiology

## 2018-02-13 DIAGNOSIS — I2510 Atherosclerotic heart disease of native coronary artery without angina pectoris: Principal | ICD-10-CM

## 2018-02-13 LAB — CBC
HCT: 39.8 % (ref 39.0–52.0)
HEMOGLOBIN: 13.1 g/dL (ref 13.0–17.0)
MCH: 29.3 pg (ref 26.0–34.0)
MCHC: 32.9 g/dL (ref 30.0–36.0)
MCV: 89 fL (ref 78.0–100.0)
Platelets: 85 10*3/uL — ABNORMAL LOW (ref 150–400)
RBC: 4.47 MIL/uL (ref 4.22–5.81)
RDW: 13.8 % (ref 11.5–15.5)
WBC: 5.9 10*3/uL (ref 4.0–10.5)

## 2018-02-13 LAB — BASIC METABOLIC PANEL
Anion gap: 7 (ref 5–15)
BUN: 15 mg/dL (ref 8–23)
CHLORIDE: 114 mmol/L — AB (ref 98–111)
CO2: 21 mmol/L — AB (ref 22–32)
CREATININE: 1.07 mg/dL (ref 0.61–1.24)
Calcium: 8.8 mg/dL — ABNORMAL LOW (ref 8.9–10.3)
GFR calc non Af Amer: >60 mL/min (ref >60)
Glucose, Bld: 91 mg/dL (ref 70–99)
POTASSIUM: 4 mmol/L (ref 3.5–5.1)
SODIUM: 142 mmol/L (ref 135–145)

## 2018-02-13 LAB — HEPARIN LEVEL (UNFRACTIONATED): Heparin Unfractionated: <0.1 IU/mL — ABNORMAL LOW (ref 0.30–0.70)

## 2018-02-13 SURGERY — CORONARY STENT INTERVENTION
Anesthesia: LOCAL

## 2018-02-13 MED ORDER — LISINOPRIL 5 MG PO TABS
5.0000 mg | ORAL_TABLET | Freq: Every day | ORAL | Status: DC
  Administered 2018-02-13: 11:00:00 5 mg via ORAL
  Filled 2018-02-13: qty 1

## 2018-02-13 MED ORDER — CLOPIDOGREL BISULFATE 75 MG PO TABS: 75.0000 mg | ORAL_TABLET | Freq: Every day | ORAL | 3 refills | Status: AC

## 2018-02-13 MED ORDER — ATORVASTATIN CALCIUM 80 MG PO TABS: 80.0000 mg | ORAL_TABLET | Freq: Every day | ORAL | 3 refills | Status: AC

## 2018-02-13 MED ORDER — CARVEDILOL 12.5 MG PO TABS: 12.5000 mg | ORAL_TABLET | Freq: Two times a day (BID) | ORAL | Status: DC

## 2018-02-13 MED ORDER — NITROGLYCERIN 0.4 MG SL SUBL: 0.4000 mg | SUBLINGUAL_TABLET | SUBLINGUAL | 3 refills | Status: AC | PRN

## 2018-02-13 MED ORDER — CARVEDILOL 3.125 MG PO TABS
6.2500 mg | ORAL_TABLET | Freq: Two times a day (BID) | ORAL | Status: DC
Start: 2018-02-13 — End: 2018-02-13
  Administered 2018-02-13: 6.25 mg via ORAL
  Filled 2018-02-13: qty 2

## 2018-02-13 MED ORDER — ISOSORBIDE MONONITRATE ER 30 MG PO TB24: 30.0000 mg | ORAL_TABLET | Freq: Every day | ORAL | 3 refills | Status: AC

## 2018-02-13 MED FILL — Heparin Sodium (Porcine) Inj 1000 Unit/ML: INTRAMUSCULAR | Qty: 10 | Status: AC

## 2018-02-13 NOTE — Telephone Encounter (Signed)
Called pt. No answer. No voicemail. I will try again later.

## 2018-02-13 NOTE — Progress Notes (Signed)
TR BAND REMOVAL  LOCATION:    right radial  DEFLATED PER PROTOCOL:    Yes.    TIME BAND OFF / DRESSING APPLIED:    22:30   SITE UPON ARRIVAL:    Level 0  SITE AFTER BAND REMOVAL:    Level 1  CIRCULATION SENSATION AND MOVEMENT:    Within Normal Limits   Yes.    COMMENTS:  Pt tolerated well. 

## 2018-02-13 NOTE — Progress Notes (Signed)
CARDIAC REHAB PHASE I   PRE:  Rate/Rhythm: 48 SR with PVCs  BP:  Sitting: 158/73      SaO2: 92 RA  MODE:  Ambulation: 350 ft   POST:  Rate/Rhythm: 92 SR with PVCs  BP:  Sitting: 171/65    SaO2: 91 RA   Pt ambulated 363ft in hallway, assist of one on R side and gait belt. Pt denies using assistive device at home, but has moderate R sided weakness when ambulating. PT and wife educated on importance of Plavix, ASA, statin and NTG. Pt given stent card and heart healthy diet. Encouraged pt to quit smoking, pt states he has no plans to quit and is declining tip sheet and fake cigarette . Reviewed restrictions and exercise guidelines. Will refer to CRP II AP.  [id number] Rufina Falco, RN BSN 02/13/2018 8:37 AM

## 2018-02-13 NOTE — Telephone Encounter (Signed)
New message    Toc  July 30th  - Per Terri there is nothing earlier

## 2018-02-13 NOTE — Discharge Summary (Signed)
Discharge Summary    Patient ID: Daniel Simon,  MRN: [id number], DOB/AGE: [birth year] 83 y.o.  Admit date: 02/10/2018 Discharge date: 02/13/2018  Primary Care Provider: Dione Housekeeper Primary Cardiologist: Carlyle Dolly, MD  Discharge Diagnoses    Principal Problem:   CAD (coronary artery disease), native coronary artery Active Problems:   Stroke (cerebrum) ([place])   Tobacco abuse   HTN (hypertension)   Acute CHF (congestive heart failure) ([place])   Ascending aortic aneurysm (HCC)   Carotid stenosis, bilateral   Abnormal stress test   DOE (dyspnea on exertion)   Chronic combined systolic and diastolic heart failure (HCC)   Allergies No Known Allergies  Diagnostic Studies/Procedures    Left heart catheterization 02/10/18:  Severe three-vessel coronary artery disease  Total occlusion of the mid LAD with left to left collaterals.  95% proximal LAD before a large diagonal branch.  The first diagonal contains 50% proximal narrowing.  Ramus intermedius with 50 to 60% mid vessel stenosis.  Large first obtuse marginal contains mid segment diffuse disease up to 70%.  RCA is totally occluded proximally.  Distal vessel fills by  left to right collaterals.  Left ventricular systolic dysfunction with apical akinesis/dyskinesis, EF 35 to 45%.  Normal LVEDP.  RECOMMENDATIONS:   The patient has severe multivessel coronary disease with chronic total occlusion of the mid LAD and proximal RCA.  In addition there is further proximal LAD disease before a large diagonal and diffuse disease in both a large ramus intermedius and first obtuse marginal.  Should be considered for surgical revascularization.  He has significant other comorbidities.  We will get TCTS consultation to determine if he is a surgical candidate.  Further management will be dependent upon surgical risk and appropriateness for surgery.  Admit to telemetry  Start long-acting nitrates  Continue aspirin  IV  heparin   Coronary stent intervention 02/12/18:  A stent was successfully placed.    Successful PCI of the proximal to mid LAD reducing a 95% stenosis to 0% with TIMI grade III flow using a 16 x 3.0 Synergy DES postdilated to 3.5 mm in diameter.  Significant ST elevation was noted during each balloon inflation.  The further distal mid LAD total occlusion was probed but could not be crossed with a guidewire.  RECOMMENDATIONS:   Dual antiplatelet therapy with aspirin and Plavix for at least 6 months.  Aggressive risk factor modification and up titration of anti-ischemic therapy as tolerated by blood pressure.  Discharge in a.m. if no problems.   Recommend uninterrupted dual antiplatelet therapy with Aspirin 81mg  daily and Clopidogrel 75mg  daily for a minimum of 6 months (stable ischemic heart disease - Class I recommendation). _____________   History of Present Illness     83 y.o. male with past medical history of chronic combined systolic and diastolic CHF (EF 89-21% by echo in 12/2017), HTN, HLD, carotid artery stenosis, AAA (4.1cm by imaging in 12/2017), COPD, and recent CVA (on 12/2017) who presented outpatient for follow-up of recent testing.   He was examined by Dr. Harl Bowie on 01/07/2018 for follow-up from his recent hospitalization for a CVA during which time he was found to have a reduced EF of 45 to 50%. He reported chronic dyspnea on exertion but denied any acute changes in his symptoms. Denied any recent chest discomfort. He was continued on Carvedilol and Lisinopril 5 mg daily was added to his medication regimen. Due to his dyspnea and LV dysfunction, an NST was recommended for ischemic evaluation.  This was performed on 01/08/2018 and showed a medium size, severe intensity, periapical defect which was partially really reversible and consistent with an infarct scar and mild peri-infarct ischemia. This was interpreted as an intermediate risk study and he was started on Amlodipine  2.5mg  daily for anti-anginal benefit.   In talking with the patient, his wife, and his 2 children outpatient, he reported continuing to have significant dyspnea on exertion. His wife reported that he gets short of breath when walking from one end of the house to the other and this is new over the past several months. He also reported significant fatigue. Denied any recent chest discomfort or palpitations. No recent orthopnea, PND, or lower extremity edema.  He has progressed well from his stroke in 12/2017 and denies any neurological deficits. He has not been checking blood pressure regularly at home but it is well controlled.    Hospital Course     Consultants: Cardiothoracic surgery   1. Severe, 3 vessel obstructive CAD: Patient underwent LHC 02/10/18 and was found to have chronic total occlusion of the mid LAD and proximal RCA, as well as pLAD disease and diffuse disease in both a large ramus intermedius and first obtuse marginal. He was recommended for consideration for surgical revascularization. CT Surgery evaluated the patient and unfortunately due to his recent stroke and numerous comorbidities, he was deemed to not be a surgical candidate. Patient underwent coronary intervention 02/12/18 with successful PCI of the proximal-mid LAD. He was recommended to continue DAPT with ASA and plavix for at least 6 months. He was started on imdur for antianginal effects. Home medications were continued for BP control  - Continue aspirin, plavix, and statin - Continue imdur - Continue coreg, lisinopril, and lasix - titrate outpatient as needed for optimal BP control  2. Chronic combined CHF: EF 45-50% on echo 12/2017; 35-45% by cath this admission. He appears euvolemic on exam. Home coreg and lisinopril were continued.  - Continue coreg and lisinopril - Resume home lasix at discharge  3. Carotid stenosis: noted to have 70% bilateral carotid disease. He was started on eliquis given inability to r/o  thrombus during admission for CVA 12/2017.  - Will discontinue Eliquis at discharge given need for ASA and plavix, as there was no clear thrombus noted to warrant triple therapy.  - Continue ASA, plavix, and statin - Continue outpatient follow up with Vascular Surgery as previously scheduled  4. Ascending aorta aneurysm: Mild.  - No need for serial monitoring as he is not a candidate for surgery  5. COPD: Home spiriva continued. Patient continues to smoke cigarettes and is not interested in quitting despite multiple conversations - Continue home medications - Continue to encourage smoking cessation.  _____________  Discharge Vitals Blood pressure (!) 171/65, pulse (!) 57, temperature 97.8 F (36.6 C), resp. rate 18, height 5\' 7"  (1.702 m), weight 144 lb 6.4 oz (65.5 kg), SpO2 92 %.  Filed Weights   02/11/18 0611 02/12/18 0536 02/13/18 0322  Weight: 137 lb 3.2 oz (62.2 kg) 135 lb 12.8 oz (61.6 kg) 144 lb 6.4 oz (65.5 kg)   Physical exam on the day of discharge:  GEN:No acute distress.   Neck:No JVD, no carotid bruits Cardiac: RRR, no murmurs, rubs, or gallops. R radial cath site with some ecchymosis; no hematoma and dressing is C/D/I Respiratory:diffuse rhonchi, no wheezing or rales EY:CXKG, Soft, nontender, non-distended  MS:No edema; No deformity. Neuro:Nonfocal, moving all extremities spontaneously Psych: Normal affect      Labs &  Radiologic Studies    CBC Recent Labs    02/12/18 0600 02/13/18 0237  WBC 6.0 5.9  HGB 13.8 13.1  HCT 41.7 39.8  MCV 89.1 89.0  PLT 82* 85*   Basic Metabolic Panel Recent Labs    02/12/18 0600 02/13/18 0237  NA 142 142  K 4.1 4.0  CL 110 114*  CO2 23 21*  GLUCOSE 92 91  BUN 14 15  CREATININE 1.19 1.07  CALCIUM 8.9 8.8*   Liver Function Tests No results for input(s): AST, ALT, ALKPHOS, BILITOT, PROT, ALBUMIN in the last 72 hours. No results for input(s): LIPASE, AMYLASE in the last 72 hours. Cardiac Enzymes No results  for input(s): CKTOTAL, CKMB, CKMBINDEX, TROPONINI in the last 72 hours. BNP Invalid input(s): POCBNP D-Dimer No results for input(s): DDIMER in the last 72 hours. Hemoglobin A1C No results for input(s): HGBA1C in the last 72 hours. Fasting Lipid Panel Recent Labs    02/12/18 0600  CHOL 110  HDL 21*  LDLCALC 58  TRIG 155*  CHOLHDL 5.2   Thyroid Function Tests No results for input(s): TSH, T4TOTAL, T3FREE, THYROIDAB in the last 72 hours.  Invalid input(s): FREET3 _____________  No results found. Disposition   Patient was seen and examined by Dr. Oval Linsey who deemed patient as stable for discharge. Follow-up has been arranged. Discharge medications as listed below.   Follow-up Plans & Appointments    Follow-up Information    Erma Heritage, PA-C Follow up on 03/03/2018.   Specialties:  Physician Assistant, Cardiology Why:  Please arrive 15 minutes early for your 1:00pm appointment. You are on the wait list for an earlier appointment.  Contact information: [place] [address] [phone]          Discharge Instructions    Amb Referral to Cardiac Rehabilitation   Complete by:  As directed    Diagnosis:  Coronary Stents   Diet - low sodium heart healthy   Complete by:  As directed    Increase activity slowly   Complete by:  As directed       Discharge Medications   Allergies as of 02/13/2018   No Known Allergies     Medication List    STOP taking these medications   apixaban 5 MG Tabs tablet Commonly known as:  ELIQUIS     TAKE these medications   albuterol 108 (90 Base) MCG/ACT inhaler Commonly known as:  PROVENTIL HFA;VENTOLIN HFA Inhale 2 puffs into the lungs every 6 (six) hours as needed for wheezing or shortness of breath.   aspirin EC 81 MG tablet Take 81 mg by mouth daily.   atorvastatin 80 MG tablet Commonly known as:  LIPITOR Take 1 tablet (80 mg total) by mouth daily at 6 PM. What changed:    medication  strength  how much to take   carvedilol 6.25 MG tablet Commonly known as:  COREG Take 1 tablet (6.25 mg total) by mouth 2 (two) times daily with a meal.   clopidogrel 75 MG tablet Commonly known as:  PLAVIX Take 1 tablet (75 mg total) by mouth daily with breakfast.   furosemide 40 MG tablet Commonly known as:  LASIX Take 1 tablet (40 mg total) by mouth daily.   isosorbide mononitrate 30 MG 24 hr tablet Commonly known as:  IMDUR Take 1 tablet (30 mg total) by mouth daily.   lisinopril 5 MG tablet Commonly known as:  PRINIVIL,ZESTRIL Take 1 tablet (5 mg total) by mouth daily.  naproxen sodium 220 MG tablet Commonly known as:  ALEVE Take 440 mg by mouth daily as needed (pain).   nitroGLYCERIN 0.4 MG SL tablet Commonly known as:  NITROSTAT Place 1 tablet (0.4 mg total) under the tongue every 5 (five) minutes as needed for chest pain.   Potassium Chloride ER 20 MEQ Tbcr Take 20 mEq by mouth daily.   tiotropium 18 MCG inhalation capsule Commonly known as:  SPIRIVA HANDIHALER Place 1 capsule (18 mcg total) into inhaler and inhale daily.        Aspirin prescribed at discharge?  Yes High Intensity Statin Prescribed? (Lipitor 40-80mg  or Crestor 20-40mg ): Yes Beta Blocker Prescribed? Yes For EF <40%, was ACEI/ARB Prescribed? Yes ADP Receptor Inhibitor Prescribed? (i.e. Plavix etc.-Includes Medically Managed Patients): Yes For EF <40%, Aldosterone Inhibitor Prescribed? No: EF >40% Was EF assessed during THIS hospitalization? Yes Was Cardiac Rehab II ordered? (Included Medically managed Patients): Yes   Outstanding Labs/Studies   None  Duration of Discharge Encounter   Greater than 30 minutes including physician time.  Signed, Abigail Butts PA-C 02/13/2018, 9:59 AM

## 2018-02-13 NOTE — Discharge Instructions (Signed)
PLEASE REMEMBER TO BRING ALL OF YOUR MEDICATIONS TO EACH OF YOUR FOLLOW-UP OFFICE VISITS.  PLEASE ATTEND ALL SCHEDULED FOLLOW-UP APPOINTMENTS.   Activity: Increase activity slowly as tolerated. You may shower, but no soaking baths (or swimming) for 1 week. No driving for 24 hours. No lifting over 5 lbs for 1 week. No sexual activity for 1 week.   You May Return to Work: in 1 week (if applicable)  Wound Care: You may wash cath site gently with soap and water. Keep cath site clean and dry. If you notice pain, swelling, bleeding or pus at your cath site, please call [phone].  It is important that you take your aspirin and plavix (clopidogrel) every day and avoid missing doses. If you miss doses of these medication, you are at risk of forming blockages in the stent placed in your heart which can lead to a heart attack.   You DO NOT need to take eliquis anymore.

## 2018-02-18 ENCOUNTER — Encounter: Payer: Medicare Other | Admitting: Vascular Surgery

## 2018-02-18 ENCOUNTER — Other Ambulatory Visit: Payer: Self-pay

## 2018-02-18 ENCOUNTER — Ambulatory Visit (INDEPENDENT_AMBULATORY_CARE_PROVIDER_SITE_OTHER): Payer: Medicare Other | Admitting: Vascular Surgery

## 2018-02-18 ENCOUNTER — Encounter: Payer: Self-pay | Admitting: Vascular Surgery

## 2018-02-18 ENCOUNTER — Encounter (HOSPITAL_COMMUNITY): Payer: Medicare Other

## 2018-02-18 VITALS — BP 100/57 | HR 69 | Temp 97.5°F | Resp 20 | Ht 67.0 in | Wt 139.0 lb

## 2018-02-18 DIAGNOSIS — I739 Peripheral vascular disease, unspecified: Secondary | ICD-10-CM

## 2018-02-18 DIAGNOSIS — I6523 Occlusion and stenosis of bilateral carotid arteries: Secondary | ICD-10-CM

## 2018-02-18 DIAGNOSIS — I2510 Atherosclerotic heart disease of native coronary artery without angina pectoris: Secondary | ICD-10-CM

## 2018-02-18 DIAGNOSIS — I639 Cerebral infarction, unspecified: Secondary | ICD-10-CM | POA: Diagnosis not present

## 2018-02-18 DIAGNOSIS — I779 Disorder of arteries and arterioles, unspecified: Secondary | ICD-10-CM | POA: Insufficient documentation

## 2018-02-18 NOTE — Progress Notes (Signed)
New Carotid Patient  Requested by:  Dione Housekeeper, MD [address] [place], [place] [address]  Reason for consultation: bilateral carotid stenosis   History of Present Illness   Daniel Simon is a 83 y.o. (Dec 19, 1934) male who presents with chief complaint: stroke in May.  Pt was seen in [facility] ED on 12/24/17 for headache and left sided numbness.  Reportedly pt had some gait issues also.  Pt also had some lightheadedness at the same time.  Work-up included MRI and CTA neck.  Pt was found to have B carotid stenosis and R side stroke with possible L sided one also.  The patient has never had amaurosis fugax or monocular blindness.  The patient has never had facial drooping or hemiplegia.  The patient has never had receptive or expressive aphasia.   The patient's previous neurologic deficits have resolved.    The patient's risks factors for carotid disease include: active smoker, HTN, and HLD.  Since his discharge from the hospital, the patient has underwent stress testing which lead to a cardiac cath which demonstrated 3 vessel disease, which was not amendable to CABG.  The patient then underwent DES of LAD with residual CAD.   Past Medical History:  Diagnosis Date  . Cancer [facility])    prostate  . Renal disorder    kidney stones  Acute CHF CAD HTN Tobacco abuse  Past Surgical History:  Procedure Laterality Date  . CORONARY STENT INTERVENTION N/A 02/12/2018   Procedure: CORONARY STENT INTERVENTION;  Surgeon: Belva Crome, MD;  Location: [place] [facility];  Service: Cardiovascular;  Laterality: N/A;  . LEFT HEART CATH AND CORONARY ANGIOGRAPHY N/A 02/10/2018   Procedure: LEFT HEART CATH AND CORONARY ANGIOGRAPHY;  Surgeon: Belva Crome, MD;  Location: [place] [facility];  Service: Cardiovascular;  Laterality: N/A;  . PROSTATE BIOPSY      Social History   Socioeconomic History  . Marital status: Married    Spouse name: Not on file  . Number of children: Not on file   . Years of education: Not on file  . Highest education level: Not on file  Occupational History  . Not on file  Social Needs  . Financial resource strain: Not on file  . Food insecurity:    Worry: Patient refused    Inability: Patient refused  . Transportation needs:    Medical: Patient refused    Non-medical: Patient refused  Tobacco Use  . Smoking status: Current Every Day Smoker    Packs/day: 1.00    Types: Cigarettes    Start date: 09/24/1948  . Smokeless tobacco: Never Used  Substance and Sexual Activity  . Alcohol use: Not Currently    Frequency: Never  . Drug use: Never  . Sexual activity: Not Currently  Lifestyle  . Physical activity:    Days per week: 0 days    Minutes per session: 0 min  . Stress: Only a little  Relationships  . Social connections:    Talks on phone: Patient refused    Gets together: Patient refused    Attends religious service: Patient refused    Active member of club or organization: Patient refused    Attends meetings of clubs or organizations: Patient refused    Relationship status: Patient refused  . Intimate partner violence:    Fear of current or ex partner: Patient refused    Emotionally abused: Patient refused    Physically abused: Patient refused    Forced sexual  activity: Patient refused  Other Topics Concern  . Not on file  Social History Narrative  . Not on file    Family History  Problem Relation Age of Onset  . Cancer Sister   . Cancer Sister   . Heart attack Brother   . Heart disease Brother     Current Outpatient Medications  Medication Sig Dispense Refill  . albuterol (PROVENTIL HFA;VENTOLIN HFA) 108 (90 Base) MCG/ACT inhaler Inhale 2 puffs into the lungs every 6 (six) hours as needed for wheezing or shortness of breath. 1 Inhaler 2  . aspirin EC 81 MG tablet Take 81 mg by mouth daily.    Marland Kitchen atorvastatin (LIPITOR) 80 MG tablet Take 1 tablet (80 mg total) by mouth daily at 6 PM. 90 tablet 3  . carvedilol (COREG)  6.25 MG tablet Take 1 tablet (6.25 mg total) by mouth 2 (two) times daily with a meal. 60 tablet 1  . clopidogrel (PLAVIX) 75 MG tablet Take 1 tablet (75 mg total) by mouth daily with breakfast. 90 tablet 3  . furosemide (LASIX) 40 MG tablet Take 1 tablet (40 mg total) by mouth daily. 30 tablet 11  . isosorbide mononitrate (IMDUR) 30 MG 24 hr tablet Take 1 tablet (30 mg total) by mouth daily. 90 tablet 3  . lisinopril (PRINIVIL,ZESTRIL) 5 MG tablet Take 1 tablet (5 mg total) by mouth daily. 30 tablet 6  . naproxen sodium (ALEVE) 220 MG tablet Take 440 mg by mouth daily as needed (pain).    . nitroGLYCERIN (NITROSTAT) 0.4 MG SL tablet Place 1 tablet (0.4 mg total) under the tongue every 5 (five) minutes as needed for chest pain. 25 tablet 3  . potassium chloride 20 MEQ TBCR Take 20 mEq by mouth daily. 30 tablet 1  . tiotropium (SPIRIVA HANDIHALER) 18 MCG inhalation capsule Place 1 capsule (18 mcg total) into inhaler and inhale daily. 30 capsule 2   No current facility-administered medications for this visit.     No Known Allergies  REVIEW OF SYSTEMS (negative unless checked):   Cardiac:  []  Chest pain or chest pressure? [x]  Shortness of breath upon activity? []  Shortness of breath when lying flat? []  Irregular heart rhythm?  Vascular:  []  Pain in calf, thigh, or hip brought on by walking? []  Pain in feet at night that wakes you up from your sleep? []  Blood clot in your veins? []  Leg swelling?  Pulmonary:  []  Oxygen at home? []  Productive cough? []  Wheezing?  Neurologic:  []  Sudden weakness in arms or legs? [x]  Sudden numbness in arms or legs? []  Sudden onset of difficult speaking or slurred speech? []  Temporary loss of vision in one eye? []  Problems with dizziness?  Gastrointestinal:  []  Blood in stool? []  Vomited blood?  Genitourinary:  []  Burning when urinating? []  Blood in urine?  Psychiatric:  []  Major depression  Hematologic:  []  Bleeding problems? []   Problems with blood clotting?  Dermatologic:  []  Rashes or ulcers?  Constitutional:  []  Fever or chills?  Ear/Nose/Throat:  []  Change in hearing? []  Nose bleeds? []  Sore throat?  Musculoskeletal:  []  Back pain? []  Joint pain? []  Muscle pain?   For VQI Use Only   PRE-ADM LIVING Home  AMB STATUS Ambulatory with Assistance  CAD Sx None  PRIOR CHF Moderate  STRESS TEST Ischemia    Physical Examination     Vitals:   02/18/18 1130 02/18/18 1134  BP: (!) 100/55 (!) 100/57  Pulse: 69   Resp: 20  Temp: (!) 97.5 F (36.4 C)   TempSrc: Oral   SpO2: 94%   Weight: 139 lb (63 kg)   Height: 5\' 7"  (1.702 m)    Body mass index is 21.77 kg/m.  General Alert, O x 3, WD, NAD  Head [place]/AT,    Ear/Nose/ Throat Hearing grossly intact, nares without erythema or drainage, oropharynx without Erythema or Exudate, Mallampati score: 3,   Eyes PERRLA, EOMI,    Neck Supple, mid-line trachea,    Pulmonary Sym exp, good B air movt, rales on BLL  Cardiac RRR, Nl S1, S2, no Murmurs, No rubs, No S3,S4  Vascular Vessel Right Left  Radial Palpable Palpable  Brachial Palpable Palpable  Carotid Palpable, No Bruit Palpable, No Bruit  Aorta Not palpable N/A  Femoral Palpable Palpable  Popliteal Not palpable Not palpable  PT Not palpable Not palpable  DP Faintly palpable Faintly palpable    Gastro- intestinal soft, non-distended, non-tender to palpation, No guarding or rebound, no HSM, no masses, no CVAT B, No palpable prominent aortic pulse,    Musculo- skeletal M/S 5/5 throughout  , Extremities without ischemic changes  , No edema present, No visible varicosities , No Lipodermatosclerosis present  Neurologic Cranial nerves 2-12 intact , Pain and light touch intact in extremities , Motor exam as listed above  Psychiatric Judgement intact, Mood & affect appropriate for pt's clinical situation  Dermatologic See M/S exam for extremity exam, No rashes otherwise noted  Lymphatic  Palpable  lymph nodes: None    Radiology     MRI/MRA Head (12/24/17): 1. Small acute right pontine infarct. 2. 1.6 cm focus of diffusion abnormality in the left body of the corpus callosum with mild mass effect favored to represent a subacute infarct. A small neoplasm and demyelination are alternative considerations. Follow-up brain MRI without and with contrast is recommended in 6 weeks. 3. Possible punctate subacute right frontal infarct. 4. Mild chronic small vessel ischemic disease. 5. Motion degraded MRA without evidence of major branch occlusion or flow limiting proximal stenosis in the anterior circulation. Possible mild bilateral ICA stenoses. 6. Occluded distal left vertebral artery.  CTA Neck (12/26/17): 1. 4.1 cm ascending aortic aneurysm. Recommend annual imaging followup by CTA or MRA. This recommendation follows 2010 ACCF/AHA/AATS/ACR/ASA/SCA/SCAI/SIR/STS/SVM Guidelines for the Diagnosis and Management of Patients with Thoracic Aortic Disease. Circulation. 2010; 121: M010-U725. 2. Severe predominantly fibrofatty plaque of the aorta, great vessels, and carotid bifurcations. 3. Penetrating ulcer/fibrofatty plaque ulceration of the brachiocephalic artery origin and proximal left subclavian artery. 4. Right proximal ICA moderate 50% stenosis. 5. Left proximal ICA severe 70% stenosis. 6. Right vertebral origin moderate 50-60% stenosis. 7. Occluded left vertebral artery V1, V2, and V3 segments. Patent left V4 segment. 8. Severe emphysema. 9. Subcarinal lymphadenopathy which may be due to granulomatous disease given calcified lower paratracheal lymph nodes. Lymphoproliferative disorder or metastasis is also possible.  I reviewed the CTA, this patient has significant plaque at both internal carotid arteries. RICA >50%, L ICA >70%.  There appears to be enough length on R, 8 cm, for R TCAR.  Similarly there appeats to 7.5 cm on the L.  There is mild anterior wall plaque evident on R  CCA.   Outside Studies/Documentation   10 pages of outside documents were reviewed including: ED report and records.   Medical Decision Making   Daniel Simon is a 83 y.o. male who presents with: B CVA, sx RICA stenosis >36%, sx LICA stenosis >64%, 3 vessel CAD   Pt's  sx are L sided numbness on presentation.  The MRI was definitive read a positive for R CVA and read suggestive of L CVA.  I would proceed with R TCAR if suitable given his significant cardiac risk, likely high risk for surgery.  Pt will likely need L TCAR vs CEA after the R carotid intervention given the MRI findings.  Based on the patient's vascular studies and examination, I have offered the patient: return to office in one weeks after CTA reviewed with [employer] rep.. . I discussed in depth with the patient the nature of atherosclerosis, and emphasized the importance of maximal medical management including strict control of blood pressure, blood glucose, and lipid levels, obtaining regular exercise, antiplatelet agents, and cessation of smoking.    The patient is currently on a statin: Lipitor.   The patient is currently on an anti-platelet: ASA and Plavix.  The patient is aware that without maximal medical management the underlying atherosclerotic disease process will progress, limiting the benefit of any interventions.  Thank you for allowing [place] to participate in this patient's care.   Adele Barthel, MD, FACS [facility] Office: [phone] Pager: [phone]  02/18/2018, 1:57 PM

## 2018-02-18 NOTE — Telephone Encounter (Signed)
Called pt. No answer, unable to leave voicemail.  

## 2018-02-24 ENCOUNTER — Other Ambulatory Visit: Payer: Self-pay | Admitting: Cardiology

## 2018-02-24 MED ORDER — CARVEDILOL 6.25 MG PO TABS: 6.2500 mg | ORAL_TABLET | Freq: Two times a day (BID) | ORAL | 1 refills | Status: DC

## 2018-02-24 NOTE — Telephone Encounter (Signed)
°*  STAT* If patient is at the pharmacy, call can be transferred to refill team.   1. Which medications need to be refilled? carvedilol (COREG) 6.25 MG tablet       2. Which pharmacy/location (including street and city if local pharmacy) is medication to be sent to? [facility]  3. Do they need a 30 day or 90 day supply?

## 2018-02-24 NOTE — Telephone Encounter (Signed)
rx sent

## 2018-02-25 ENCOUNTER — Ambulatory Visit (INDEPENDENT_AMBULATORY_CARE_PROVIDER_SITE_OTHER): Payer: Medicare Other | Admitting: Vascular Surgery

## 2018-02-25 ENCOUNTER — Other Ambulatory Visit: Payer: Self-pay

## 2018-02-25 ENCOUNTER — Other Ambulatory Visit: Payer: Self-pay | Admitting: Vascular Surgery

## 2018-02-25 ENCOUNTER — Encounter: Payer: Self-pay | Admitting: Vascular Surgery

## 2018-02-25 VITALS — BP 111/56 | HR 62 | Temp 97.0°F | Resp 15 | Ht 67.0 in | Wt 136.4 lb

## 2018-02-25 DIAGNOSIS — I2510 Atherosclerotic heart disease of native coronary artery without angina pectoris: Secondary | ICD-10-CM | POA: Diagnosis not present

## 2018-02-25 DIAGNOSIS — I6523 Occlusion and stenosis of bilateral carotid arteries: Secondary | ICD-10-CM

## 2018-02-25 DIAGNOSIS — I639 Cerebral infarction, unspecified: Secondary | ICD-10-CM | POA: Diagnosis not present

## 2018-02-25 NOTE — Progress Notes (Signed)
Established Carotid Patient   History of Present Illness   Daniel Simon is a 83 y.o. (01-Oct-1934) male who presents with chief complaint: stroke in May.  Pt was seen in [facility] ED on 12/24/17 for headache and left sided numbness.  Reportedly pt had some gait issues also.  Pt also had some lightheadedness at the same time.  Work-up included MRI and CTA neck.  Pt was found to have B carotid stenosis and R side stroke with possible L sided one also.  The patient has never had amaurosis fugax or monocular blindness.  The patient has never had facial drooping or hemiplegia.  The patient has never had receptive or expressive aphasia.   The patient's previous neurologic deficits have resolved.    The patient's risks factors for carotid disease include: active smoker, HTN, and HLD.  Since his discharge from the hospital, the patient has underwent stress testing which lead to a cardiac cath which demonstrated 3 vessel disease, which was not amendable to CABG.  The patient then underwent DES of LAD with residual CAD.  The patient returns today for pre-operative counseling.      Past Medical History:  Diagnosis Date  . Cancer [facility])    prostate  . Renal disorder    kidney stones  Acute CHF CAD HTN Tobacco abuse       Past Surgical History:  Procedure Laterality Date  . CORONARY STENT INTERVENTION N/A 02/12/2018   Procedure: CORONARY STENT INTERVENTION;  Surgeon: Belva Crome, MD;  Location: [place] [facility];  Service: Cardiovascular;  Laterality: N/A;  . LEFT HEART CATH AND CORONARY ANGIOGRAPHY N/A 02/10/2018   Procedure: LEFT HEART CATH AND CORONARY ANGIOGRAPHY;  Surgeon: Belva Crome, MD;  Location: [place] [facility];  Service: Cardiovascular;  Laterality: N/A;  . PROSTATE BIOPSY      Social History        Socioeconomic History  . Marital status: Married    Spouse name: Not on file  . Number of children: Not on file  . Years of education: Not on  file  . Highest education level: Not on file  Occupational History  . Not on file  Social Needs  . Financial resource strain: Not on file  . Food insecurity:    Worry: Patient refused    Inability: Patient refused  . Transportation needs:    Medical: Patient refused    Non-medical: Patient refused  Tobacco Use  . Smoking status: Current Every Day Smoker    Packs/day: 1.00    Types: Cigarettes    Start date: 09/24/1948  . Smokeless tobacco: Never Used  Substance and Sexual Activity  . Alcohol use: Not Currently    Frequency: Never  . Drug use: Never  . Sexual activity: Not Currently  Lifestyle  . Physical activity:    Days per week: 0 days    Minutes per session: 0 min  . Stress: Only a little  Relationships  . Social connections:    Talks on phone: Patient refused    Gets together: Patient refused    Attends religious service: Patient refused    Active member of club or organization: Patient refused    Attends meetings of clubs or organizations: Patient refused    Relationship status: Patient refused  . Intimate partner violence:    Fear of current or ex partner: Patient refused    Emotionally abused: Patient refused    Physically abused: Patient refused    Forced  sexual activity: Patient refused  Other Topics Concern  . Not on file  Social History Narrative  . Not on file         Family History  Problem Relation Age of Onset  . Cancer Sister   . Cancer Sister   . Heart attack Brother   . Heart disease Brother           Current Outpatient Medications  Medication Sig Dispense Refill  . albuterol (PROVENTIL HFA;VENTOLIN HFA) 108 (90 Base) MCG/ACT inhaler Inhale 2 puffs into the lungs every 6 (six) hours as needed for wheezing or shortness of breath. 1 Inhaler 2  . aspirin EC 81 MG tablet Take 81 mg by mouth daily.    Marland Kitchen atorvastatin (LIPITOR) 80 MG tablet Take 1 tablet (80 mg total) by mouth daily at 6 PM. 90  tablet 3  . carvedilol (COREG) 6.25 MG tablet Take 1 tablet (6.25 mg total) by mouth 2 (two) times daily with a meal. 60 tablet 1  . clopidogrel (PLAVIX) 75 MG tablet Take 1 tablet (75 mg total) by mouth daily with breakfast. 90 tablet 3  . furosemide (LASIX) 40 MG tablet Take 1 tablet (40 mg total) by mouth daily. 30 tablet 11  . isosorbide mononitrate (IMDUR) 30 MG 24 hr tablet Take 1 tablet (30 mg total) by mouth daily. 90 tablet 3  . lisinopril (PRINIVIL,ZESTRIL) 5 MG tablet Take 1 tablet (5 mg total) by mouth daily. 30 tablet 6  . naproxen sodium (ALEVE) 220 MG tablet Take 440 mg by mouth daily as needed (pain).    . nitroGLYCERIN (NITROSTAT) 0.4 MG SL tablet Place 1 tablet (0.4 mg total) under the tongue every 5 (five) minutes as needed for chest pain. 25 tablet 3  . potassium chloride 20 MEQ TBCR Take 20 mEq by mouth daily. 30 tablet 1  . tiotropium (SPIRIVA HANDIHALER) 18 MCG inhalation capsule Place 1 capsule (18 mcg total) into inhaler and inhale daily. 30 capsule 2   No current facility-administered medications for this visit.     No Known Allergies  REVIEW OF SYSTEMS (negative unless checked):   Cardiac:  []  Chest pain or chest pressure? [x]  Shortness of breath upon activity? []  Shortness of breath when lying flat? []  Irregular heart rhythm?  Vascular:  []  Pain in calf, thigh, or hip brought on by walking? []  Pain in feet at night that wakes you up from your sleep? []  Blood clot in your veins? []  Leg swelling?  Pulmonary:  []  Oxygen at home? []  Productive cough? []  Wheezing?  Neurologic:  []  Sudden weakness in arms or legs? [x]  Sudden numbness in arms or legs? []  Sudden onset of difficult speaking or slurred speech? []  Temporary loss of vision in one eye? []  Problems with dizziness?  Gastrointestinal:  []  Blood in stool? []  Vomited blood?  Genitourinary:  []  Burning when urinating? []  Blood in urine?  Psychiatric:  []  Major  depression  Hematologic:  []  Bleeding problems? []  Problems with blood clotting?  Dermatologic:  []  Rashes or ulcers?  Constitutional:  []  Fever or chills?  Ear/Nose/Throat:  []  Change in hearing? []  Nose bleeds? []  Sore throat?  Musculoskeletal:  []  Back pain? []  Joint pain? []  Muscle pain?   For VQI Use Only   PRE-ADM LIVING Home  AMB STATUS Ambulatory with Assistance  CAD Sx None  PRIOR CHF Moderate  STRESS TEST Ischemia    Physical Examination     There were no vitals filed for this visit.  General Alert, O x 3, WD, NAD  Eyes PERRLA, EOMI,    Neck Supple, mid-line trachea,    Pulmonary Sym exp, good B air movt, rales on BLL  Cardiac RRR, Nl S1, S2, no Murmurs, No rubs, No S3,S4  Vascular Vessel Right Left  Radial Palpable Palpable  Brachial Palpable Palpable  Carotid Palpable, No Bruit Palpable, No Bruit  Aorta Not palpable N/A  Femoral Palpable Palpable  Popliteal Not palpable Not palpable  PT Not palpable Not palpable  DP Faintly palpable Faintly palpable    Gastro- intestinal soft, non-distended, non-tender to palpation, No guarding or rebound, no HSM, no masses, no CVAT B, No palpable prominent aortic pulse,    Musculo- skeletal M/S 5/5 throughout  , Extremities without ischemic changes  , No edema present, No visible varicosities , No Lipodermatosclerosis present  Neurologic Cranial nerves 2-12 intact , Pain and light touch intact in extremities , Motor exam as listed above    Medical Decision Making   Daniel Simon is a 83 y.o. male who presents with: B CVA, sx RICA stenosis >54%, sx LICA stenosis >62%, 3 vessel CAD   Based on the review of the CTA with [facility], patient is candidate for R TCAR.  I discussed with the patient that TCAR is an open carotid exposure with direct carotid cannulation for carotid stent placement.   Flow reversal is used to limited distal embolization, so clamping the common carotid artery is  necessary. The patient is aware of that the risks of this operation include but are not limited to: bleeding, infection, access site complications, renal failure, embolization, rupture of vessel, dissection, arteriovenous fistula, possible need for emergent surgical intervention, possible need for surgical procedures to treat the patient's pathology, anaphylactic reaction to contrast, vagus nerve injury, and stroke and death.   The patient is aware of the risks and agrees to proceed. This case will be scheduled with Dr. Donzetta Matters as per our practice's current protocol using two surgeons.  I discussed in depth with the patient the nature of atherosclerosis, and emphasized the importance of maximal medical management including strict control of blood pressure, blood glucose, and lipid levels, obtaining regular exercise, antiplatelet agents, and cessation of smoking.    The patient is currently on a statin: Lipitor.   The patient is currently on an anti-platelet: ASA and Plavix.  I confirmed pt is taking all three agents.  The patient is aware that without maximal medical management the underlying atherosclerotic disease process will progress, limiting the benefit of any interventions.  Thank you for allowing [place] to participate in this patient's care.   Adele Barthel, MD, FACS [facility] [place] Office: [phone] Pager: [phone]

## 2018-02-25 NOTE — H&P (View-Only) (Signed)
Established Carotid Patient   History of Present Illness   Daniel Simon is a 83 y.o. ([birth year]) male who presents with chief complaint: stroke in May.  Pt was seen in [facility] ED on 12/24/17 for headache and left sided numbness.  Reportedly pt had some gait issues also.  Pt also had some lightheadedness at the same time.  Work-up included MRI and CTA neck.  Pt was found to have B carotid stenosis and R side stroke with possible L sided one also.  The patient has never had amaurosis fugax or monocular blindness.  The patient has never had facial drooping or hemiplegia.  The patient has never had receptive or expressive aphasia.   The patient's previous neurologic deficits have resolved.    The patient's risks factors for carotid disease include: active smoker, HTN, and HLD.  Since his discharge from the hospital, the patient has underwent stress testing which lead to a cardiac cath which demonstrated 3 vessel disease, which was not amendable to CABG.  The patient then underwent DES of LAD with residual CAD.  The patient returns today for pre-operative counseling.      Past Medical History:  Diagnosis Date  . Cancer [facility])    prostate  . Renal disorder    kidney stones  Acute CHF CAD HTN Tobacco abuse       Past Surgical History:  Procedure Laterality Date  . CORONARY STENT INTERVENTION N/A 02/12/2018   Procedure: CORONARY STENT INTERVENTION;  Surgeon: Belva Crome, MD;  Location: [place] [facility];  Service: Cardiovascular;  Laterality: N/A;  . LEFT HEART CATH AND CORONARY ANGIOGRAPHY N/A 02/10/2018   Procedure: LEFT HEART CATH AND CORONARY ANGIOGRAPHY;  Surgeon: Belva Crome, MD;  Location: [place] [facility];  Service: Cardiovascular;  Laterality: N/A;  . PROSTATE BIOPSY      Social History        Socioeconomic History  . Marital status: Married    Spouse name: Not on file  . Number of children: Not on file  . Years of education: Not on  file  . Highest education level: Not on file  Occupational History  . Not on file  Social Needs  . Financial resource strain: Not on file  . Food insecurity:    Worry: Patient refused    Inability: Patient refused  . Transportation needs:    Medical: Patient refused    Non-medical: Patient refused  Tobacco Use  . Smoking status: Current Every Day Smoker    Packs/day: 1.00    Types: Cigarettes    Start date: 09/24/1948  . Smokeless tobacco: Never Used  Substance and Sexual Activity  . Alcohol use: Not Currently    Frequency: Never  . Drug use: Never  . Sexual activity: Not Currently  Lifestyle  . Physical activity:    Days per week: 0 days    Minutes per session: 0 min  . Stress: Only a little  Relationships  . Social connections:    Talks on phone: Patient refused    Gets together: Patient refused    Attends religious service: Patient refused    Active member of club or organization: Patient refused    Attends meetings of clubs or organizations: Patient refused    Relationship status: Patient refused  . Intimate partner violence:    Fear of current or ex partner: Patient refused    Emotionally abused: Patient refused    Physically abused: Patient refused    Forced  sexual activity: Patient refused  Other Topics Concern  . Not on file  Social History Narrative  . Not on file         Family History  Problem Relation Age of Onset  . Cancer Sister   . Cancer Sister   . Heart attack Brother   . Heart disease Brother           Current Outpatient Medications  Medication Sig Dispense Refill  . albuterol (PROVENTIL HFA;VENTOLIN HFA) 108 (90 Base) MCG/ACT inhaler Inhale 2 puffs into the lungs every 6 (six) hours as needed for wheezing or shortness of breath. 1 Inhaler 2  . aspirin EC 81 MG tablet Take 81 mg by mouth daily.    Marland Kitchen atorvastatin (LIPITOR) 80 MG tablet Take 1 tablet (80 mg total) by mouth daily at 6 PM. 90  tablet 3  . carvedilol (COREG) 6.25 MG tablet Take 1 tablet (6.25 mg total) by mouth 2 (two) times daily with a meal. 60 tablet 1  . clopidogrel (PLAVIX) 75 MG tablet Take 1 tablet (75 mg total) by mouth daily with breakfast. 90 tablet 3  . furosemide (LASIX) 40 MG tablet Take 1 tablet (40 mg total) by mouth daily. 30 tablet 11  . isosorbide mononitrate (IMDUR) 30 MG 24 hr tablet Take 1 tablet (30 mg total) by mouth daily. 90 tablet 3  . lisinopril (PRINIVIL,ZESTRIL) 5 MG tablet Take 1 tablet (5 mg total) by mouth daily. 30 tablet 6  . naproxen sodium (ALEVE) 220 MG tablet Take 440 mg by mouth daily as needed (pain).    . nitroGLYCERIN (NITROSTAT) 0.4 MG SL tablet Place 1 tablet (0.4 mg total) under the tongue every 5 (five) minutes as needed for chest pain. 25 tablet 3  . potassium chloride 20 MEQ TBCR Take 20 mEq by mouth daily. 30 tablet 1  . tiotropium (SPIRIVA HANDIHALER) 18 MCG inhalation capsule Place 1 capsule (18 mcg total) into inhaler and inhale daily. 30 capsule 2   No current facility-administered medications for this visit.     No Known Allergies  REVIEW OF SYSTEMS (negative unless checked):   Cardiac:  []  Chest pain or chest pressure? [x]  Shortness of breath upon activity? []  Shortness of breath when lying flat? []  Irregular heart rhythm?  Vascular:  []  Pain in calf, thigh, or hip brought on by walking? []  Pain in feet at night that wakes you up from your sleep? []  Blood clot in your veins? []  Leg swelling?  Pulmonary:  []  Oxygen at home? []  Productive cough? []  Wheezing?  Neurologic:  []  Sudden weakness in arms or legs? [x]  Sudden numbness in arms or legs? []  Sudden onset of difficult speaking or slurred speech? []  Temporary loss of vision in one eye? []  Problems with dizziness?  Gastrointestinal:  []  Blood in stool? []  Vomited blood?  Genitourinary:  []  Burning when urinating? []  Blood in urine?  Psychiatric:  []  Major  depression  Hematologic:  []  Bleeding problems? []  Problems with blood clotting?  Dermatologic:  []  Rashes or ulcers?  Constitutional:  []  Fever or chills?  Ear/Nose/Throat:  []  Change in hearing? []  Nose bleeds? []  Sore throat?  Musculoskeletal:  []  Back pain? []  Joint pain? []  Muscle pain?   For VQI Use Only   PRE-ADM LIVING Home  AMB STATUS Ambulatory with Assistance  CAD Sx None  PRIOR CHF Moderate  STRESS TEST Ischemia    Physical Examination     There were no vitals filed for this visit.  General Alert, O x 3, WD, NAD  Eyes PERRLA, EOMI,    Neck Supple, mid-line trachea,    Pulmonary Sym exp, good B air movt, rales on BLL  Cardiac RRR, Nl S1, S2, no Murmurs, No rubs, No S3,S4  Vascular Vessel Right Left  Radial Palpable Palpable  Brachial Palpable Palpable  Carotid Palpable, No Bruit Palpable, No Bruit  Aorta Not palpable N/A  Femoral Palpable Palpable  Popliteal Not palpable Not palpable  PT Not palpable Not palpable  DP Faintly palpable Faintly palpable    Gastro- intestinal soft, non-distended, non-tender to palpation, No guarding or rebound, no HSM, no masses, no CVAT B, No palpable prominent aortic pulse,    Musculo- skeletal M/S 5/5 throughout  , Extremities without ischemic changes  , No edema present, No visible varicosities , No Lipodermatosclerosis present  Neurologic Cranial nerves 2-12 intact , Pain and light touch intact in extremities , Motor exam as listed above    Medical Decision Making   Daniel Simon is a 83 y.o. male who presents with: B CVA, sx RICA stenosis >64%, sx LICA stenosis >33%, 3 vessel CAD   Based on the review of the CTA with [facility], patient is candidate for R TCAR.  I discussed with the patient that TCAR is an open carotid exposure with direct carotid cannulation for carotid stent placement.   Flow reversal is used to limited distal embolization, so clamping the common carotid artery is  necessary. The patient is aware of that the risks of this operation include but are not limited to: bleeding, infection, access site complications, renal failure, embolization, rupture of vessel, dissection, arteriovenous fistula, possible need for emergent surgical intervention, possible need for surgical procedures to treat the patient's pathology, anaphylactic reaction to contrast, vagus nerve injury, and stroke and death.   The patient is aware of the risks and agrees to proceed. This case will be scheduled with Dr. Donzetta Matters as per our practice's current protocol using two surgeons.  I discussed in depth with the patient the nature of atherosclerosis, and emphasized the importance of maximal medical management including strict control of blood pressure, blood glucose, and lipid levels, obtaining regular exercise, antiplatelet agents, and cessation of smoking.    The patient is currently on a statin: Lipitor.   The patient is currently on an anti-platelet: ASA and Plavix.  I confirmed pt is taking all three agents.  The patient is aware that without maximal medical management the underlying atherosclerotic disease process will progress, limiting the benefit of any interventions.  Thank you for allowing [place] to participate in this patient's care.   Adele Barthel, MD, FACS [facility] [place] Office: [phone] Pager: [phone]

## 2018-02-26 ENCOUNTER — Other Ambulatory Visit: Payer: Self-pay | Admitting: *Deleted

## 2018-03-02 NOTE — Progress Notes (Signed)
Cardiology Office Note    Date:  03/03/2018   ID:  Daniel Simon, DOB [birth year], MRN [id number]  PCP:  Dione Housekeeper, MD  Cardiologist: Carlyle Dolly, MD    Chief Complaint  Patient presents with  . Hospitalization Follow-up    History of Present Illness:    Daniel Simon is a 83 y.o. male with past medical history of chronic combined systolic and diastolic CHF (EF 31-51% by echo in 12/2017), HTN, HLD, carotid artery stenosis, AAA (4.1cm by imaging in 12/2017), COPD, tobacco use and recent CVA (on 12/2017) who presents to the office today for hospital follow-up.   He was recently examined by myself on 02/02/2018 after having been found to have mild peri-infarct ischemia on a recent stress test. Given his test results, known reduced EF, and worsening dyspnea on exertion, a cardiac catheterization was recommended for definitive evaluation. This was performed on 02/10/2018 by Dr. Tamala Julian and showed severe 3-vessel CAD with 95% Proximal-LAD stenosis, total occlusion of mid-LAD with collaterals, 50% D1, 50-60% RI, 70% OM1, and occluded RCA filled by left to right collaterals. CT Surgery was consulted but he was not felt to be a CABG candidate given his recent CVA, severely disease ascending thoracic aorta, and COPD. Long-term surveillance of the aneurysmal enlargement of the ascending aorta was not recommended as he would not be a surgical candidate. He therefore returned to the cath labs and underwent successful PCI/DES placement to the proximal-mid LAD on 02/12/2018. The distal LAD occlusion could not be crossed with a guidewire. He was started on DAPT with ASA and Plavix with Eliquis being discontinued given the need for DAPT. Was also continued on Coreg, Lisinopril, Lasix and Atorvastatin with Imdur being added to his medication regimen.   Since hospital discharge, he has followed up with Vascular Surgery and is planning to undergo transcarotid revascularization next month.   In talking  with the patient today, he reports overall doing well since his recent cardiac catheterization. He denies any recent episodes of chest pain. He has chronic dyspnea on exertion in the setting of COPD and is unsure if this has improved. Denies any recent orthopnea, PND, lower extremity edema, or palpitations.  He reports good compliance with DAPT and denies missing any recent doses. No recent melena, hematochezia, or hematuria.  He has reduced his tobacco use from 1 ppd to 0.5 ppd.    Past Medical History:  Diagnosis Date  . CAD (coronary artery disease)    a. 02/2018: 3-vessel CAD by cath --> not felt to be a CABG candidate, therefore s/p DES to proximal LAD.   [facility])    prostate  . Hyperlipidemia LDL goal <70   . Renal disorder    kidney stones    Past Surgical History:  Procedure Laterality Date  . CORONARY STENT INTERVENTION N/A 02/12/2018   Procedure: CORONARY STENT INTERVENTION;  Surgeon: Belva Crome, MD;  Location: [place] [facility];  Service: Cardiovascular;  Laterality: N/A;  . LEFT HEART CATH AND CORONARY ANGIOGRAPHY N/A 02/10/2018   Procedure: LEFT HEART CATH AND CORONARY ANGIOGRAPHY;  Surgeon: Belva Crome, MD;  Location: [facility];  Service: Cardiovascular;  Laterality: N/A;  . PROSTATE BIOPSY      Current Medications: Outpatient Medications Prior to Visit  Medication Sig Dispense Refill  . albuterol (PROVENTIL HFA;VENTOLIN HFA) 108 (90 Base) MCG/ACT inhaler Inhale 2 puffs into the lungs every 6 (six) hours as needed for wheezing or shortness of breath. 1  Inhaler 2  . aspirin EC 81 MG tablet Take 81 mg by mouth daily.    [facility] atorvastatin (LIPITOR) 80 MG tablet Take 1 tablet (80 mg total) by mouth daily at 6 PM. 90 tablet 3  . carvedilol (COREG) 6.25 MG tablet Take 1 tablet (6.25 mg total) by mouth 2 (two) times daily with a meal. 60 tablet 1  . clopidogrel (PLAVIX) 75 MG tablet Take 1 tablet (75 mg total) by mouth daily with breakfast. 90 tablet 3  .  furosemide (LASIX) 40 MG tablet Take 1 tablet (40 mg total) by mouth daily. 30 tablet 11  . isosorbide mononitrate (IMDUR) 30 MG 24 hr tablet Take 1 tablet (30 mg total) by mouth daily. 90 tablet 3  . lisinopril (PRINIVIL,ZESTRIL) 5 MG tablet Take 1 tablet (5 mg total) by mouth daily. 30 tablet 6  . naproxen sodium (ALEVE) 220 MG tablet Take 440 mg by mouth daily as needed (pain).    . nitroGLYCERIN (NITROSTAT) 0.4 MG SL tablet Place 1 tablet (0.4 mg total) under the tongue every 5 (five) minutes as needed for chest pain. 25 tablet 3  . Potassium Chloride ER 20 MEQ TBCR TAKE 1 TABLET BY MOUTH ONCE DAILY 30 tablet 6  . SPIRIVA HANDIHALER 18 MCG inhalation capsule INHALE 1 CAPSULE VIA HANDIHALER ONCE DAILY AT THE SAME TIME EVERY DAY 30 capsule 2  . atorvastatin (LIPITOR) 40 MG tablet TAKE 1 TABLET (40 MG TOTAL) BY MOUTH DAILY AT 6 PM. 30 tablet 3   No facility-administered medications prior to visit.      Allergies:   Patient has no known allergies.   Social History   Socioeconomic History  . Marital status: Married    Spouse name: Not on file  . Number of children: Not on file  . Years of education: Not on file  . Highest education level: Not on file  Occupational History  . Not on file  Social Needs  . Financial resource strain: Not on file  . Food insecurity:    Worry: Patient refused    Inability: Patient refused  . Transportation needs:    Medical: Patient refused    Non-medical: Patient refused  Tobacco Use  . Smoking status: Current Every Day Smoker    Packs/day: 1.00    Types: Cigarettes    Start date: 09/24/1948  . Smokeless tobacco: Never Used  Substance and Sexual Activity  . Alcohol use: Not Currently    Frequency: Never  . Drug use: Never  . Sexual activity: Not Currently  Lifestyle  . Physical activity:    Days per week: 0 days    Minutes per session: 0 min  . Stress: Only a little  Relationships  . Social connections:    Talks on phone: Patient refused     Gets together: Patient refused    Attends religious service: Patient refused    Active member of club or organization: Patient refused    Attends meetings of clubs or organizations: Patient refused    Relationship status: Patient refused  Other Topics Concern  . Not on file  Social History Narrative  . Not on file     Family History:  The patient's family history includes Cancer in his sister and sister; Heart attack in his brother; Heart disease in his brother.   Review of Systems:   Please see the history of present illness.     General:  No chills, fever, night sweats or weight changes.  Cardiovascular:  No  chest pain, edema, orthopnea, palpitations, paroxysmal nocturnal dyspnea. Positive for dyspnea on exertion.  Dermatological: No rash, lesions/masses Respiratory: No cough, dyspnea Urologic: No hematuria, dysuria Abdominal:   No nausea, vomiting, diarrhea, bright red blood per rectum, melena, or hematemesis Neurologic:  No visual changes, wkns, changes in mental status. All other systems reviewed and are otherwise negative except as noted above.   Physical Exam:    VS:  BP (!) 104/56   Pulse 63   Ht 5\' 7"  (1.702 m)   Wt 135 lb (61.2 kg)   SpO2 93%   BMI 21.14 kg/m    General: Well developed, elderly Caucasian male appearing in no acute distress. Head: Normocephalic, atraumatic, sclera non-icteric, no xanthomas, nares are without discharge.  Neck: No carotid bruits. JVD not elevated.  Lungs: Respirations regular and unlabored, without wheezes or rales.  Heart: Regular rate and rhythm. No S3 or S4.  No murmur, no rubs, or gallops appreciated. Abdomen: Soft, non-tender, non-distended with normoactive bowel sounds. No hepatomegaly. No rebound/guarding. No obvious abdominal masses. Msk:  Strength and tone appear normal for age. No joint deformities or effusions. Extremities: No clubbing or cyanosis. No lower extremity edema.  Distal pedal pulses are 2+ bilaterally. Neuro:  Alert and oriented X 3. Moves all extremities spontaneously. No focal deficits noted. Psych:  Responds to questions appropriately with a normal affect. Skin: No rashes or lesions noted  Wt Readings from Last 3 Encounters:  03/03/18 135 lb (61.2 kg)  02/25/18 136 lb 6.4 oz (61.9 kg)  02/18/18 139 lb (63 kg)     Studies/Labs Reviewed:   EKG:  EKG is not ordered today.    Recent Labs: 12/24/2017: ALT 10; B Natriuretic Peptide 509.0 02/13/2018: BUN 15; Creatinine, Ser 1.07; Hemoglobin 13.1; Platelets 85; Potassium 4.0; Sodium 142   Lipid Panel    Component Value Date/Time   CHOL 110 02/12/2018 0600   TRIG 155 (H) 02/12/2018 0600   HDL 21 (L) 02/12/2018 0600   CHOLHDL 5.2 02/12/2018 0600   VLDL 31 02/12/2018 0600   LDLCALC 58 02/12/2018 0600    Additional studies/ records that were reviewed today include:   Echocardiogram: 12/25/2017 Study Conclusions  - Left ventricle: Distal septal / apical hypokinesis. The cavity   size was mildly dilated. Wall thickness was increased in a   pattern of mild LVH. Systolic function was mildly reduced. The   estimated ejection fraction was in the range of 45% to 50%.   Doppler parameters are consistent with abnormal left ventricular   relaxation (grade 1 diastolic dysfunction). - Aortic valve: There was mild regurgitation. Valve area (VTI):   1.61 cm^2. Valve area (Vmax): 1.67 cm^2. Valve area (Vmean): 1.66   cm^2. - Mitral valve: There was mild regurgitation. - Left atrium: The atrium was moderately dilated. - Atrial septum: No defect or patent foramen ovale was identified. - Pulmonary arteries: PA peak pressure: 35 mm Hg (S).  Cardiac Catheterization: 02/10/2018  Severe three-vessel coronary artery disease  Total occlusion of the mid LAD with left to left collaterals.  95% proximal LAD before a large diagonal branch.  The first diagonal contains 50% proximal narrowing.  Ramus intermedius with 50 to 60% mid vessel stenosis.  Large  first obtuse marginal contains mid segment diffuse disease up to 70%.  RCA is totally occluded proximally.  Distal vessel fills by  left to right collaterals.  Left ventricular systolic dysfunction with apical akinesis/dyskinesis, EF 35 to 45%.  Normal LVEDP.  RECOMMENDATIONS:   The patient  has severe multivessel coronary disease with chronic total occlusion of the mid LAD and proximal RCA.  In addition there is further proximal LAD disease before a large diagonal and diffuse disease in both a large ramus intermedius and first obtuse marginal.  Should be considered for surgical revascularization.  He has significant other comorbidities.  We will get TCTS consultation to determine if he is a surgical candidate.  Further management will be dependent upon surgical risk and appropriateness for surgery.  Admit to telemetry  Start long-acting nitrates  Continue aspirin  IV heparin  Coronary Stent Intervention: 02/12/2018  A stent was successfully placed.    Successful PCI of the proximal to mid LAD reducing a 95% stenosis to 0% with TIMI grade III flow using a 16 x 3.0 Synergy DES postdilated to 3.5 mm in diameter.  Significant ST elevation was noted during each balloon inflation.  The further distal mid LAD total occlusion was probed but could not be crossed with a guidewire.  RECOMMENDATIONS:   Dual antiplatelet therapy with aspirin and Plavix for at least 6 months.  Aggressive risk factor modification and up titration of anti-ischemic therapy as tolerated by blood pressure.  Discharge in a.m. if no problems.   Recommend uninterrupted dual antiplatelet therapy with Aspirin 81mg  daily and Clopidogrel 75mg  daily for a minimum of 6 months (stable ischemic heart disease - Class I recommendation).  Assessment:    1. Coronary artery disease involving native coronary artery of native heart without angina pectoris   2. Chronic combined systolic and diastolic heart failure ([place])     3. Essential hypertension   4. Mixed hyperlipidemia   5. Carotid stenosis, bilateral   6. Tobacco use   7. Ascending aortic aneurysm ([place])      Plan:   In order of problems listed above:  1. CAD - recent cardiac catheterization showed severe 3-vessel CAD with 95% Proximal-LAD stenosis, total occlusion of mid-LAD with collaterals, 50% D1, 50-60% RI, 70% OM1, and occluded RCA filled by left to right collaterals. Not felt to be a CABG candidate, therefore he underwent successful PCI/DES placement to the proximal-mid LAD on 02/12/2018.  - He has baseline dyspnea on exertion but denies any acute changes in this. No recent chest discomfort. - His recent cardiac catheterization report was reviewed in detail with the patient and his wife today. Will continue on DAPT with ASA and Plavix along with BB and statin therapy.  2. Chronic Combined Systolic and Diastolic CHF - EF reduced to 45% by echocardiogram in 12/2017. He has baseline dyspnea on exertion but denies any recent orthopnea, PND, or lower extremity edema. - He appears euvolemic by examination today. - Will continue on Coreg 6.25 mg twice daily, Lisinopril 5 mg daily, and Lasix 40 mg daily.  Can consider a repeat echocardiogram at the time of his next office visit as he will be greater than 3 months out from recent revascularization.  3. HTN - BP is well controlled at 104/56 during today's visit. I have asked for him to continue to follow this in the ambulatory setting. - Continue Carvedilol 6.25 mg twice daily, Imdur 30 mg daily, and Lisinopril 5 mg daily. Would reduce Lisinopril to 2.5 mg daily if BP remains soft but will continue at current dosing for now as he is asymptomatic.   4. HLD - FLP on 02/12/2018 showed total cholesterol 110, triglycerides 155, HDL 21, and LDL 58.   - At goal of LDL less than 70. Continue Atorvastatin 80 mg  daily.  5. Carotid Artery Stenosis - doppler studies in 81/1572 showed LICA stenosis > 62% and RICA  stenosis at 50-69%. Followed by Dr. Bridgett Larsson.  He is planning to undergo transcarotid revascularization next month. By review with his wife, they were informed to continue on DAPT leading up to the procedure. Will verify with Dr. Bridgett Larsson as his DAPT cannot be stopped for at least 6 months following recent stent placement.   6. Tobacco Use - He was previously smoking 1 pack/day. Has reduced this to 0.5 packs/day. Congratulated on his reduction with full cessation advised.  7. Ascending Aortic Aneurysm - at 4.1 cm by imaging in 12/2017. By review of recent CT Surgery consult, long-term surveillance was not recommended as he is not felt to be a surgical candidate.     Medication Adjustments/Labs and Tests Ordered: Current medicines are reviewed at length with the patient today.  Concerns regarding medicines are outlined above.  Medication changes, Labs and Tests ordered today are listed in the Patient Instructions below. Patient Instructions  Medication Instructions:  Your physician recommends that you continue on your current medications as directed. Please refer to the Current Medication list given to you today.  Labwork: NONE   Testing/Procedures: NONE   Follow-Up: Your physician recommends that you schedule a follow-up appointment in: 4 Months with Dr. Harl Bowie.  Any Other Special Instructions Will Be Listed Below (If Applicable).  If you need a refill on your cardiac medications before your next appointment, please call your pharmacy.  Thank you for choosing [facility]!    Signed, Erma Heritage, PA-C  03/03/2018 3:02 PM    [place] [address] [place], [place] [address] Phone: [phone]

## 2018-03-03 ENCOUNTER — Encounter

## 2018-03-03 ENCOUNTER — Encounter: Payer: Self-pay | Admitting: Student

## 2018-03-03 ENCOUNTER — Ambulatory Visit (INDEPENDENT_AMBULATORY_CARE_PROVIDER_SITE_OTHER): Payer: Medicare Other | Admitting: Student

## 2018-03-03 VITALS — BP 104/56 | HR 63 | Ht 67.0 in | Wt 135.0 lb

## 2018-03-03 DIAGNOSIS — I10 Essential (primary) hypertension: Secondary | ICD-10-CM | POA: Diagnosis not present

## 2018-03-03 DIAGNOSIS — I639 Cerebral infarction, unspecified: Secondary | ICD-10-CM

## 2018-03-03 DIAGNOSIS — I6523 Occlusion and stenosis of bilateral carotid arteries: Secondary | ICD-10-CM

## 2018-03-03 DIAGNOSIS — I2510 Atherosclerotic heart disease of native coronary artery without angina pectoris: Secondary | ICD-10-CM

## 2018-03-03 DIAGNOSIS — E782 Mixed hyperlipidemia: Secondary | ICD-10-CM

## 2018-03-03 DIAGNOSIS — Z720 Tobacco use: Secondary | ICD-10-CM

## 2018-03-03 DIAGNOSIS — I712 Thoracic aortic aneurysm, without rupture: Secondary | ICD-10-CM

## 2018-03-03 DIAGNOSIS — I5042 Chronic combined systolic (congestive) and diastolic (congestive) heart failure: Secondary | ICD-10-CM

## 2018-03-03 DIAGNOSIS — I7121 Aneurysm of the ascending aorta, without rupture: Secondary | ICD-10-CM

## 2018-03-03 NOTE — Patient Instructions (Signed)
Medication Instructions:  Your physician recommends that you continue on your current medications as directed. Please refer to the Current Medication list given to you today.   Labwork: NONE   Testing/Procedures: NONE   Follow-Up: Your physician recommends that you schedule a follow-up appointment in: 4 Months with Dr. Harl Bowie.   Any Other Special Instructions Will Be Listed Below (If Applicable).     If you need a refill on your cardiac medications before your next appointment, please call your pharmacy.  Thank you for choosing [facility]!

## 2018-03-04 NOTE — Pre-Procedure Instructions (Signed)
JAMIAH RECORE  03/04/2018      [id number] - [place], [place] - [address] [place] [address] Phone: [phone] Fax: [phone]    Your procedure is scheduled on Thursday, Aug 8th   Report to [facility] Admitting at  5:30 AM             (posted surgery time 7:30a - 9:30a)   Call this number if you have problems the morning of surgery:  [phone]   Remember:   Do not eat any foods or drink any liquids after midnight, Wednesday.              4-5 days prior to surgery, STOP TAKING any Vitamins, Herbal Supplements, Anti-inflammatories.               Follow the instructions per your surgeon as to when you may or may not stop aspirin and plavix.   Take these medicines the morning of surgery with A SIP OF WATER : Carvedilol, Imudur.  Please use your inhalers that morning.    Do not wear jewelry - no rings or watches.  Do not wear lotions, colognes or deodorant.   Men may shave face and neck.  Do not bring valuables to the hospital.  [facility] is not responsible for any belongings or valuables.  Contacts, dentures or bridgework may not be worn into surgery.  Leave your suitcase in the car.  After surgery it may be brought to your room.  For patients admitted to the hospital, discharge time will be determined by your treatment team.  Please read over the following fact sheets that you were given. Pain Booklet, MRSA Information and Surgical Site Infection Prevention      [facility]- Preparing For Surgery  Before surgery, you can play an important role. Because skin is not sterile, your skin needs to be as free of germs as possible. You can reduce the number of germs on your skin by washing with CHG (chlorahexidine gluconate) Soap before surgery.  CHG is an antiseptic cleaner which kills germs and bonds with the skin to continue killing germs even after washing.    Oral Hygiene is also important to reduce your  risk of infection.    Remember - BRUSH YOUR TEETH THE MORNING OF SURGERY WITH YOUR REGULAR TOOTHPASTE  Please do not use if you have an allergy to CHG or antibacterial soaps. If your skin becomes reddened/irritated stop using the CHG.  Do not shave (including legs and underarms) for at least 48 hours prior to first CHG shower. It is OK to shave your face.  Please follow these instructions carefully.   1. Shower the NIGHT BEFORE SURGERY and the MORNING OF SURGERY with CHG.   2. If you chose to wash your hair, wash your hair first as usual with your normal shampoo.  3. After you shampoo, rinse your hair and body thoroughly to remove the shampoo.  4. Use CHG as you would any other liquid soap. You can apply CHG directly to the skin and wash gently with a scrungie or a clean washcloth.   5. Apply the CHG Soap to your body ONLY FROM THE NECK DOWN.  Do not use on open wounds or open sores. Avoid contact with your eyes, ears, mouth and genitals (private parts). Wash Face and genitals (private parts)  with your normal soap.  6. Wash thoroughly, paying special attention to the area where your surgery  will be performed.  7. Thoroughly rinse your body with warm water from the neck down.  8. DO NOT shower/wash with your normal soap after using and rinsing off the CHG Soap.  9. Pat yourself dry with a CLEAN TOWEL.  10. Wear CLEAN PAJAMAS to bed the night before surgery, wear comfortable clothes the morning of surgery  11. Place CLEAN SHEETS on your bed the night of your first shower and DO NOT SLEEP WITH PETS.    Day of Surgery:  Do not apply any deodorants/lotions.  Please wear clean clothes to the hospital/surgery center.    Remember to brush your teeth WITH YOUR REGULAR TOOTHPASTE.

## 2018-03-05 ENCOUNTER — Inpatient Hospital Stay (HOSPITAL_COMMUNITY)
Admission: RE | Admit: 2018-03-05 | Discharge: 2018-03-05 | Disposition: A | Discharge status: Home / Self Care | Payer: Medicare Other | Source: Ambulatory Visit

## 2018-03-09 ENCOUNTER — Other Ambulatory Visit: Payer: Self-pay

## 2018-03-09 ENCOUNTER — Encounter (HOSPITAL_COMMUNITY): Payer: Self-pay

## 2018-03-09 ENCOUNTER — Encounter (HOSPITAL_COMMUNITY)
Admission: RE | Admit: 2018-03-09 | Discharge: 2018-03-09 | Disposition: A | Discharge status: Home / Self Care | Payer: Medicare Other | Source: Ambulatory Visit | Attending: Vascular Surgery | Admitting: Vascular Surgery

## 2018-03-09 HISTORY — DX: Personal history of urinary calculi: Z87.442

## 2018-03-09 HISTORY — DX: Occlusion and stenosis of right carotid artery: I65.21

## 2018-03-09 HISTORY — DX: Chronic obstructive pulmonary disease, unspecified: J44.9

## 2018-03-09 HISTORY — DX: Essential (primary) hypertension: I10

## 2018-03-09 LAB — COMPREHENSIVE METABOLIC PANEL
ALK PHOS: 103 U/L (ref 38–126)
ALT: 22 U/L (ref 0–44)
AST: 22 U/L (ref 15–41)
Albumin: 3.9 g/dL (ref 3.5–5.0)
Anion gap: 13 (ref 5–15)
BILIRUBIN TOTAL: 0.7 mg/dL (ref 0.3–1.2)
BUN: 38 mg/dL — AB (ref 8–23)
CO2: 20 mmol/L — ABNORMAL LOW (ref 22–32)
CREATININE: 1.53 mg/dL — AB (ref 0.61–1.24)
Calcium: 9.6 mg/dL (ref 8.9–10.3)
Chloride: 109 mmol/L (ref 98–111)
GFR, EST AFRICAN AMERICAN: 47 mL/min — AB (ref >60)
GFR, EST NON AFRICAN AMERICAN: 41 mL/min — AB (ref >60)
Glucose, Bld: 99 mg/dL (ref 70–99)
Potassium: 4.8 mmol/L (ref 3.5–5.1)
Sodium: 142 mmol/L (ref 135–145)
TOTAL PROTEIN: 7.7 g/dL (ref 6.5–8.1)

## 2018-03-09 LAB — CBC
HEMATOCRIT: 44.3 % (ref 39.0–52.0)
HEMOGLOBIN: 14.1 g/dL (ref 13.0–17.0)
MCH: 29.6 pg (ref 26.0–34.0)
MCHC: 31.8 g/dL (ref 30.0–36.0)
MCV: 92.9 fL (ref 78.0–100.0)
PLATELETS: 152 10*3/uL (ref 150–400)
RBC: 4.77 MIL/uL (ref 4.22–5.81)
RDW: 14.3 % (ref 11.5–15.5)
WBC: 6.9 10*3/uL (ref 4.0–10.5)

## 2018-03-09 LAB — PROTIME-INR
INR: 1.14
PROTHROMBIN TIME: 14.6 s (ref 11.4–15.2)

## 2018-03-09 LAB — TYPE AND SCREEN
ABO/RH(D): A POS
Antibody Screen: NEGATIVE

## 2018-03-09 LAB — SURGICAL PCR SCREEN
MRSA, PCR: NEGATIVE
Staphylococcus aureus: NEGATIVE

## 2018-03-09 LAB — APTT: aPTT: 34 seconds (ref 24–36)

## 2018-03-09 LAB — ABO/RH: ABO/RH(D): A POS

## 2018-03-09 NOTE — Progress Notes (Signed)
PCP - Dr. Dione Housekeeper  Cardiologist - Dr. Carlyle Dolly  Chest x-ray - 12/24/17 (E)  EKG - 02/12/18 (E)  Stress Test - 01/08/18 (E)  ECHO - 12/25/17 (E)  Cardiac Cath - 02/12/18 (E)  Sleep Study - Denies CPAP - None  LABS- 03/09/18: CBC, CMP, PT, PTT, T/S, UA, PCR  ASA- Continue Plavix- Continue   Anesthesia- Yes- Cardiac history  Pt denies having chest pain, sob, or fever at this time. All instructions explained to the pt, with a verbal understanding of the material. Pt agrees to go over the instructions while at home for a better understanding. The opportunity to ask questions was provided.

## 2018-03-11 NOTE — Anesthesia Preprocedure Evaluation (Addendum)
Anesthesia Evaluation  Patient identified by MRN, date of birth, ID band Patient awake    Reviewed: Allergy & Precautions, NPO status , Patient's Chart, lab work & pertinent test results  History of Anesthesia Complications Negative for: history of anesthetic complications  Airway Mallampati: I  TM Distance: >3 FB Neck ROM: Full    Dental  (+) Edentulous Lower, Edentulous Upper, Dental Advisory Given   Pulmonary COPD, Current Smoker,    Pulmonary exam normal        Cardiovascular hypertension, + CAD and + Peripheral Vascular Disease  Normal cardiovascular exam  02/2018: 3-vessel CAD by cath --> not felt to be a CABG candidate, therefore s/p DES to proximal LAD.    Neuro/Psych CVA    GI/Hepatic negative GI ROS, Neg liver ROS,   Endo/Other  negative endocrine ROS  Renal/GU negative Renal ROS     Musculoskeletal negative musculoskeletal ROS (+)   Abdominal   Peds  Hematology negative hematology ROS (+)   Anesthesia Other Findings Day of surgery medications reviewed with the patient.  Reproductive/Obstetrics                            Anesthesia Physical Anesthesia Plan  ASA: III  Anesthesia Plan: General   Post-op Pain Management:    Induction: Intravenous  PONV Risk Score and Plan: 3 and Ondansetron, Dexamethasone and Diphenhydramine  Airway Management Planned: Oral ETT  Additional Equipment: Arterial line  Intra-op Plan:   Post-operative Plan: Extubation in OR  Informed Consent: I have reviewed the patients History and Physical, chart, labs and discussed the procedure including the risks, benefits and alternatives for the proposed anesthesia with the patient or authorized representative who has indicated his/her understanding and acceptance.   Dental advisory given  Plan Discussed with: CRNA, Anesthesiologist and Surgeon  Anesthesia Plan Comments:        Anesthesia  Quick Evaluation

## 2018-03-12 ENCOUNTER — Inpatient Hospital Stay (HOSPITAL_COMMUNITY)
Admission: RE | Admit: 2018-03-12 | Discharge: 2018-03-13 | DRG: 036 | Disposition: A | Discharge status: Home Health | Payer: Medicare Other | Attending: Vascular Surgery | Admitting: Vascular Surgery

## 2018-03-12 ENCOUNTER — Telehealth: Payer: Self-pay | Admitting: *Deleted

## 2018-03-12 ENCOUNTER — Inpatient Hospital Stay (HOSPITAL_COMMUNITY): Payer: Medicare Other | Admitting: Physician Assistant

## 2018-03-12 ENCOUNTER — Encounter (HOSPITAL_COMMUNITY)
Admission: RE | Disposition: A | Discharge status: Home Health | Payer: Self-pay | Source: Home / Self Care | Attending: Vascular Surgery

## 2018-03-12 ENCOUNTER — Other Ambulatory Visit: Payer: Self-pay

## 2018-03-12 ENCOUNTER — Encounter (HOSPITAL_COMMUNITY): Payer: Self-pay | Admitting: Urology

## 2018-03-12 ENCOUNTER — Inpatient Hospital Stay (HOSPITAL_COMMUNITY): Payer: Medicare Other | Admitting: Anesthesiology

## 2018-03-12 DIAGNOSIS — Z87442 Personal history of urinary calculi: Secondary | ICD-10-CM | POA: Diagnosis not present

## 2018-03-12 DIAGNOSIS — Z955 Presence of coronary angioplasty implant and graft: Secondary | ICD-10-CM | POA: Diagnosis not present

## 2018-03-12 DIAGNOSIS — I6521 Occlusion and stenosis of right carotid artery: Secondary | ICD-10-CM | POA: Diagnosis not present

## 2018-03-12 DIAGNOSIS — Z791 Long term (current) use of non-steroidal anti-inflammatories (NSAID): Secondary | ICD-10-CM | POA: Diagnosis not present

## 2018-03-12 DIAGNOSIS — Z7902 Long term (current) use of antithrombotics/antiplatelets: Secondary | ICD-10-CM | POA: Diagnosis not present

## 2018-03-12 DIAGNOSIS — I6523 Occlusion and stenosis of bilateral carotid arteries: Principal | ICD-10-CM | POA: Diagnosis present

## 2018-03-12 DIAGNOSIS — Z8546 Personal history of malignant neoplasm of prostate: Secondary | ICD-10-CM | POA: Diagnosis not present

## 2018-03-12 DIAGNOSIS — Z8249 Family history of ischemic heart disease and other diseases of the circulatory system: Secondary | ICD-10-CM | POA: Diagnosis not present

## 2018-03-12 DIAGNOSIS — I10 Essential (primary) hypertension: Secondary | ICD-10-CM | POA: Diagnosis present

## 2018-03-12 DIAGNOSIS — Z8673 Personal history of transient ischemic attack (TIA), and cerebral infarction without residual deficits: Secondary | ICD-10-CM | POA: Diagnosis not present

## 2018-03-12 DIAGNOSIS — I2510 Atherosclerotic heart disease of native coronary artery without angina pectoris: Secondary | ICD-10-CM | POA: Diagnosis present

## 2018-03-12 DIAGNOSIS — Z79899 Other long term (current) drug therapy: Secondary | ICD-10-CM | POA: Diagnosis not present

## 2018-03-12 DIAGNOSIS — F17210 Nicotine dependence, cigarettes, uncomplicated: Secondary | ICD-10-CM | POA: Diagnosis present

## 2018-03-12 DIAGNOSIS — Z7982 Long term (current) use of aspirin: Secondary | ICD-10-CM | POA: Diagnosis not present

## 2018-03-12 HISTORY — PX: TRANSCAROTID ARTERY REVASCULARIZATIONA: SHX6778

## 2018-03-12 HISTORY — PX: TRANSCAROTID ARTERY REVASCULARIZATION (TCAR): SHX6784

## 2018-03-12 HISTORY — DX: Cerebral infarction, unspecified: I63.9

## 2018-03-12 HISTORY — DX: Acute myocardial infarction, unspecified: I21.9

## 2018-03-12 LAB — POCT ACTIVATED CLOTTING TIME: Activated Clotting Time: 263 seconds

## 2018-03-12 SURGERY — TRANSCAROTID ARTERY REVASCULARIZATION (TCAR)
Anesthesia: General | Site: Neck | Laterality: Right

## 2018-03-12 MED ORDER — SODIUM CHLORIDE 0.9 % IV SOLN
INTRAVENOUS | Status: DC | PRN
  Administered 2018-03-12: 30 ug/min via INTRAVENOUS

## 2018-03-12 MED ORDER — OXYCODONE-ACETAMINOPHEN 5-325 MG PO TABS: 1.0000 | ORAL_TABLET | ORAL | Status: DC | PRN

## 2018-03-12 MED ORDER — PROTAMINE SULFATE 10 MG/ML IV SOLN
INTRAVENOUS | Status: AC
  Filled 2018-03-12: qty 50

## 2018-03-12 MED ORDER — NITROGLYCERIN 0.4 MG SL SUBL: 0.4000 mg | SUBLINGUAL_TABLET | SUBLINGUAL | Status: DC | PRN

## 2018-03-12 MED ORDER — CEFAZOLIN SODIUM-DEXTROSE 2-4 GM/100ML-% IV SOLN
INTRAVENOUS | Status: AC
  Filled 2018-03-12: qty 100

## 2018-03-12 MED ORDER — CHLORHEXIDINE GLUCONATE 4 % EX LIQD: 60.0000 mL | Freq: Once | CUTANEOUS | Status: DC

## 2018-03-12 MED ORDER — PROTAMINE SULFATE 10 MG/ML IV SOLN
INTRAVENOUS | Status: DC | PRN
  Administered 2018-03-12: 40 mg via INTRAVENOUS

## 2018-03-12 MED ORDER — EPHEDRINE SULFATE 50 MG/ML IJ SOLN
INTRAMUSCULAR | Status: DC | PRN
  Administered 2018-03-12 (×2): 5 mg via INTRAVENOUS

## 2018-03-12 MED ORDER — DOCUSATE SODIUM 100 MG PO CAPS
100.0000 mg | ORAL_CAPSULE | Freq: Every day | ORAL | Status: DC
  Filled 2018-03-12: qty 1

## 2018-03-12 MED ORDER — SODIUM CHLORIDE 0.9 % IV SOLN: INTRAVENOUS | Status: DC

## 2018-03-12 MED ORDER — SODIUM CHLORIDE 0.9 % IV SOLN
INTRAVENOUS | Status: DC
  Administered 2018-03-12: 11:00:00 via INTRAVENOUS

## 2018-03-12 MED ORDER — LABETALOL HCL 5 MG/ML IV SOLN: 10.0000 mg | INTRAVENOUS | Status: DC | PRN

## 2018-03-12 MED ORDER — CEFAZOLIN SODIUM-DEXTROSE 2-4 GM/100ML-% IV SOLN
2.0000 g | INTRAVENOUS | Status: AC
  Administered 2018-03-12: 2 g via INTRAVENOUS

## 2018-03-12 MED ORDER — PROPOFOL 10 MG/ML IV BOLUS
INTRAVENOUS | Status: AC
  Filled 2018-03-12: qty 20

## 2018-03-12 MED ORDER — PANTOPRAZOLE SODIUM 40 MG PO TBEC
40.0000 mg | DELAYED_RELEASE_TABLET | Freq: Every day | ORAL | Status: DC
  Administered 2018-03-12 – 2018-03-13 (×2): 40 mg via ORAL
  Filled 2018-03-12 (×2): qty 1

## 2018-03-12 MED ORDER — SUCCINYLCHOLINE CHLORIDE 200 MG/10ML IV SOSY
PREFILLED_SYRINGE | INTRAVENOUS | Status: AC
  Filled 2018-03-12: qty 10

## 2018-03-12 MED ORDER — MAGNESIUM SULFATE 2 GM/50ML IV SOLN: 2.0000 g | Freq: Every day | INTRAVENOUS | Status: DC | PRN

## 2018-03-12 MED ORDER — BISACODYL 10 MG RE SUPP: 10.0000 mg | Freq: Every day | RECTAL | Status: DC | PRN

## 2018-03-12 MED ORDER — LIDOCAINE HCL (CARDIAC) PF 100 MG/5ML IV SOSY
PREFILLED_SYRINGE | INTRAVENOUS | Status: DC | PRN
  Administered 2018-03-12: 100 mg via INTRAVENOUS

## 2018-03-12 MED ORDER — DEXAMETHASONE SODIUM PHOSPHATE 10 MG/ML IJ SOLN
INTRAMUSCULAR | Status: DC | PRN
  Administered 2018-03-12: 10 mg via INTRAVENOUS

## 2018-03-12 MED ORDER — SODIUM CHLORIDE 0.9 % IV SOLN
INTRAVENOUS | Status: AC
  Filled 2018-03-12: qty 1.2

## 2018-03-12 MED ORDER — GLYCOPYRROLATE 0.2 MG/ML IJ SOLN
INTRAMUSCULAR | Status: DC | PRN
  Administered 2018-03-12: 0.2 mg via INTRAVENOUS

## 2018-03-12 MED ORDER — HYDROMORPHONE HCL 1 MG/ML IJ SOLN: 0.2500 mg | INTRAMUSCULAR | Status: DC | PRN

## 2018-03-12 MED ORDER — SODIUM CHLORIDE 0.9 % IV SOLN
INTRAVENOUS | Status: DC | PRN
  Administered 2018-03-12: 500 mL

## 2018-03-12 MED ORDER — METOPROLOL TARTRATE 5 MG/5ML IV SOLN
2.0000 mg | INTRAVENOUS | Status: DC | PRN
Start: 2018-03-12 — End: 2018-03-13

## 2018-03-12 MED ORDER — CARVEDILOL 6.25 MG PO TABS
6.2500 mg | ORAL_TABLET | Freq: Two times a day (BID) | ORAL | Status: DC
  Administered 2018-03-13: 6.25 mg via ORAL
  Filled 2018-03-12 (×2): qty 1

## 2018-03-12 MED ORDER — ONDANSETRON HCL 4 MG/2ML IJ SOLN
INTRAMUSCULAR | Status: AC
  Filled 2018-03-12: qty 2

## 2018-03-12 MED ORDER — LIDOCAINE HCL (PF) 1 % IJ SOLN
INTRAMUSCULAR | Status: AC
  Filled 2018-03-12: qty 30

## 2018-03-12 MED ORDER — ACETAMINOPHEN 325 MG RE SUPP: 325.0000 mg | RECTAL | Status: DC | PRN

## 2018-03-12 MED ORDER — ROCURONIUM BROMIDE 10 MG/ML (PF) SYRINGE
PREFILLED_SYRINGE | INTRAVENOUS | Status: AC
  Filled 2018-03-12: qty 10

## 2018-03-12 MED ORDER — CEFAZOLIN SODIUM-DEXTROSE 2-4 GM/100ML-% IV SOLN: 2.0000 g | Freq: Three times a day (TID) | INTRAVENOUS | Status: DC

## 2018-03-12 MED ORDER — EPHEDRINE 5 MG/ML INJ
INTRAVENOUS | Status: AC
  Filled 2018-03-12: qty 10

## 2018-03-12 MED ORDER — TIOTROPIUM BROMIDE MONOHYDRATE 18 MCG IN CAPS
18.0000 ug | ORAL_CAPSULE | Freq: Every day | RESPIRATORY_TRACT | Status: DC
  Administered 2018-03-13: 18 ug via RESPIRATORY_TRACT
  Filled 2018-03-12: qty 5

## 2018-03-12 MED ORDER — ATORVASTATIN CALCIUM 80 MG PO TABS: 80.0000 mg | ORAL_TABLET | Freq: Every day | ORAL | Status: DC

## 2018-03-12 MED ORDER — SUGAMMADEX SODIUM 200 MG/2ML IV SOLN
INTRAVENOUS | Status: DC | PRN
  Administered 2018-03-12: 150 mg via INTRAVENOUS

## 2018-03-12 MED ORDER — ALBUTEROL SULFATE (2.5 MG/3ML) 0.083% IN NEBU: 2.5000 mg | INHALATION_SOLUTION | Freq: Four times a day (QID) | RESPIRATORY_TRACT | Status: DC | PRN

## 2018-03-12 MED ORDER — ONDANSETRON HCL 4 MG/2ML IJ SOLN
INTRAMUSCULAR | Status: DC | PRN
  Administered 2018-03-12: 4 mg via INTRAVENOUS

## 2018-03-12 MED ORDER — MORPHINE SULFATE (PF) 2 MG/ML IV SOLN: 2.0000 mg | INTRAVENOUS | Status: DC | PRN

## 2018-03-12 MED ORDER — 0.9 % SODIUM CHLORIDE (POUR BTL) OPTIME
TOPICAL | Status: DC | PRN
  Administered 2018-03-12: 1000 mL

## 2018-03-12 MED ORDER — FUROSEMIDE 40 MG PO TABS
40.0000 mg | ORAL_TABLET | Freq: Every day | ORAL | Status: DC
  Administered 2018-03-12 – 2018-03-13 (×2): 40 mg via ORAL
  Filled 2018-03-12 (×2): qty 1

## 2018-03-12 MED ORDER — HEPARIN SODIUM (PORCINE) 1000 UNIT/ML IJ SOLN
INTRAMUSCULAR | Status: AC
  Filled 2018-03-12: qty 2

## 2018-03-12 MED ORDER — PROMETHAZINE HCL 25 MG/ML IJ SOLN: 6.2500 mg | INTRAMUSCULAR | Status: DC | PRN

## 2018-03-12 MED ORDER — PHENYLEPHRINE 40 MCG/ML (10ML) SYRINGE FOR IV PUSH (FOR BLOOD PRESSURE SUPPORT)
PREFILLED_SYRINGE | INTRAVENOUS | Status: AC
  Filled 2018-03-12: qty 10

## 2018-03-12 MED ORDER — ACETAMINOPHEN 325 MG PO TABS: 325.0000 mg | ORAL_TABLET | ORAL | Status: DC | PRN

## 2018-03-12 MED ORDER — PHENOL 1.4 % MT LIQD: 1.0000 | OROMUCOSAL | Status: DC | PRN

## 2018-03-12 MED ORDER — PHENYLEPHRINE HCL 10 MG/ML IJ SOLN
INTRAMUSCULAR | Status: DC | PRN
  Administered 2018-03-12 (×2): 80 ug via INTRAVENOUS

## 2018-03-12 MED ORDER — SODIUM CHLORIDE 0.9 % IV SOLN: 500.0000 mL | Freq: Once | INTRAVENOUS | Status: DC | PRN

## 2018-03-12 MED ORDER — ALBUTEROL SULFATE HFA 108 (90 BASE) MCG/ACT IN AERS: 2.0000 | INHALATION_SPRAY | Freq: Four times a day (QID) | RESPIRATORY_TRACT | Status: DC | PRN

## 2018-03-12 MED ORDER — PROPOFOL 10 MG/ML IV BOLUS
INTRAVENOUS | Status: DC | PRN
  Administered 2018-03-12: 100 mg via INTRAVENOUS

## 2018-03-12 MED ORDER — LACTATED RINGERS IV SOLN
INTRAVENOUS | Status: DC
  Administered 2018-03-12: 06:00:00 via INTRAVENOUS

## 2018-03-12 MED ORDER — CLOPIDOGREL BISULFATE 75 MG PO TABS
75.0000 mg | ORAL_TABLET | Freq: Every day | ORAL | Status: DC
  Administered 2018-03-13: 75 mg via ORAL
  Filled 2018-03-12: qty 1

## 2018-03-12 MED ORDER — GUAIFENESIN-DM 100-10 MG/5ML PO SYRP: 15.0000 mL | ORAL_SOLUTION | ORAL | Status: DC | PRN

## 2018-03-12 MED ORDER — FENTANYL CITRATE (PF) 100 MCG/2ML IJ SOLN
INTRAMUSCULAR | Status: DC | PRN
  Administered 2018-03-12: 100 ug via INTRAVENOUS

## 2018-03-12 MED ORDER — HEPARIN SODIUM (PORCINE) 1000 UNIT/ML IJ SOLN
INTRAMUSCULAR | Status: DC | PRN
  Administered 2018-03-12: 6000 [IU] via INTRAVENOUS

## 2018-03-12 MED ORDER — ISOSORBIDE MONONITRATE ER 30 MG PO TB24
30.0000 mg | ORAL_TABLET | Freq: Every day | ORAL | Status: DC
  Administered 2018-03-13: 30 mg via ORAL
  Filled 2018-03-12: qty 1

## 2018-03-12 MED ORDER — POLYETHYLENE GLYCOL 3350 17 G PO PACK: 17.0000 g | PACK | Freq: Every day | ORAL | Status: DC | PRN

## 2018-03-12 MED ORDER — ASPIRIN EC 81 MG PO TBEC
81.0000 mg | DELAYED_RELEASE_TABLET | Freq: Every day | ORAL | Status: DC
  Administered 2018-03-13: 81 mg via ORAL
  Filled 2018-03-12: qty 1

## 2018-03-12 MED ORDER — IODIXANOL 320 MG/ML IV SOLN
INTRAVENOUS | Status: DC | PRN
  Administered 2018-03-12: 50 mL via INTRAVENOUS

## 2018-03-12 MED ORDER — ONDANSETRON HCL 4 MG/2ML IJ SOLN: 4.0000 mg | Freq: Four times a day (QID) | INTRAMUSCULAR | Status: DC | PRN

## 2018-03-12 MED ORDER — HYDRALAZINE HCL 20 MG/ML IJ SOLN: 5.0000 mg | INTRAMUSCULAR | Status: DC | PRN

## 2018-03-12 MED ORDER — DEXAMETHASONE SODIUM PHOSPHATE 10 MG/ML IJ SOLN
INTRAMUSCULAR | Status: AC
  Filled 2018-03-12: qty 1

## 2018-03-12 MED ORDER — FENTANYL CITRATE (PF) 250 MCG/5ML IJ SOLN
INTRAMUSCULAR | Status: AC
  Filled 2018-03-12: qty 5

## 2018-03-12 MED ORDER — CEFAZOLIN SODIUM-DEXTROSE 2-4 GM/100ML-% IV SOLN
2.0000 g | Freq: Two times a day (BID) | INTRAVENOUS | Status: AC
  Administered 2018-03-12 – 2018-03-13 (×2): 2 g via INTRAVENOUS
  Filled 2018-03-12 (×2): qty 100

## 2018-03-12 MED ORDER — ROCURONIUM BROMIDE 100 MG/10ML IV SOLN
INTRAVENOUS | Status: DC | PRN
  Administered 2018-03-12: 60 mg via INTRAVENOUS

## 2018-03-12 MED ORDER — HEMOSTATIC AGENTS (NO CHARGE) OPTIME
TOPICAL | Status: DC | PRN
  Administered 2018-03-12: 1 via TOPICAL

## 2018-03-12 MED ORDER — ENSURE ENLIVE PO LIQD
237.0000 mL | Freq: Two times a day (BID) | ORAL | Status: DC
  Administered 2018-03-12: 237 mL via ORAL

## 2018-03-12 MED ORDER — DIPHENHYDRAMINE HCL 50 MG/ML IJ SOLN
INTRAMUSCULAR | Status: DC | PRN
  Administered 2018-03-12: 10 mg via INTRAVENOUS

## 2018-03-12 SURGICAL SUPPLY — 72 items
BAG BANDED W/RUBBER/TAPE 36X54 (MISCELLANEOUS) ×3 IMPLANT
CANISTER SUCT 3000ML PPV (MISCELLANEOUS) ×3 IMPLANT
CATH ROBINSON RED A/P 18FR (CATHETERS) ×3 IMPLANT
CLIP VESOCCLUDE MED 24/CT (CLIP) ×3 IMPLANT
CLIP VESOCCLUDE SM WIDE 24/CT (CLIP) ×3 IMPLANT
COVER DOME SNAP 22 D (MISCELLANEOUS) ×3 IMPLANT
COVER PROBE W GEL 5X96 (DRAPES) ×3 IMPLANT
CRADLE DONUT ADULT HEAD (MISCELLANEOUS) ×3 IMPLANT
DERMABOND ADVANCED (GAUZE/BANDAGES/DRESSINGS) ×4
DERMABOND ADVANCED .7 DNX12 (GAUZE/BANDAGES/DRESSINGS) ×2 IMPLANT
DRAIN CHANNEL 15F RND FF W/TCR (WOUND CARE) IMPLANT
DRAPE INCISE IOBAN 85X60 (DRAPES) ×6 IMPLANT
DRAPE UNIVERSAL PACK (DRAPES) ×3 IMPLANT
ELECT REM PT RETURN 9FT ADLT (ELECTROSURGICAL) ×3
ELECTRODE REM PT RTRN 9FT ADLT (ELECTROSURGICAL) ×1 IMPLANT
EVACUATOR SILICONE 100CC (DRAIN) IMPLANT
GLOVE BIO SURGEON STRL SZ7 (GLOVE) ×3 IMPLANT
GLOVE BIO SURGEON STRL SZ7.5 (GLOVE) ×6 IMPLANT
GLOVE BIOGEL PI IND STRL 6.5 (GLOVE) ×4 IMPLANT
GLOVE BIOGEL PI IND STRL 7.0 (GLOVE) ×2 IMPLANT
GLOVE BIOGEL PI INDICATOR 6.5 (GLOVE) ×8
GLOVE BIOGEL PI INDICATOR 7.0 (GLOVE) ×4
GOWN STRL REUS W/ TWL LRG LVL3 (GOWN DISPOSABLE) ×2 IMPLANT
GOWN STRL REUS W/ TWL XL LVL3 (GOWN DISPOSABLE) ×3 IMPLANT
GOWN STRL REUS W/TWL LRG LVL3 (GOWN DISPOSABLE) ×4
GOWN STRL REUS W/TWL XL LVL3 (GOWN DISPOSABLE) ×6
GUIDEWIRE ENROUTE 0.014 (WIRE) ×3 IMPLANT
HEMOSTAT SNOW SURGICEL 2X4 (HEMOSTASIS) IMPLANT
HEMOSTAT SPONGE AVITENE ULTRA (HEMOSTASIS) ×3 IMPLANT
INSERT FOGARTY SM (MISCELLANEOUS) IMPLANT
INTRODUCER KIT GALT 7CM (INTRODUCER) ×2
IV ADAPTER SYR DOUBLE MALE LL (MISCELLANEOUS) IMPLANT
KIT BASIN OR (CUSTOM PROCEDURE TRAY) ×3 IMPLANT
KIT ENCORE 26 ADVANTAGE (KITS) ×3 IMPLANT
KIT INTRODUCER GALT 7 (INTRODUCER) ×1 IMPLANT
KIT TURNOVER KIT B (KITS) ×3 IMPLANT
NEEDLE HYPO 25GX1X1/2 BEV (NEEDLE) IMPLANT
NEEDLE PERC 18GX7CM (NEEDLE) ×3 IMPLANT
NEEDLE SPNL 20GX3.5 QUINCKE YW (NEEDLE) IMPLANT
PACK CAROTID (CUSTOM PROCEDURE TRAY) ×3 IMPLANT
PAD ARMBOARD 7.5X6 YLW CONV (MISCELLANEOUS) ×6 IMPLANT
PROTECTION STATION PRESSURIZED (MISCELLANEOUS) ×3
SET MICROPUNCTURE 5F STIFF (MISCELLANEOUS) ×3 IMPLANT
SHEATH AVANTI 11CM 5FR (SHEATH) IMPLANT
SHUNT CAROTID BYPASS 10 (VASCULAR PRODUCTS) IMPLANT
SHUNT CAROTID BYPASS 12FRX15.5 (VASCULAR PRODUCTS) IMPLANT
STATION PROTECTION PRESSURIZED (MISCELLANEOUS) ×1 IMPLANT
STENT TRANSCAROTID SYS 10X30 (Permanent Stent) ×3 IMPLANT
STOPCOCK 4 WAY LG BORE MALE ST (IV SETS) ×3 IMPLANT
SUT ETHILON 3 0 PS 1 (SUTURE) IMPLANT
SUT PROLENE 5 0 C 1 24 (SUTURE) ×3 IMPLANT
SUT PROLENE 6 0 BV (SUTURE) ×6 IMPLANT
SUT PROLENE 7 0 BV 1 (SUTURE) IMPLANT
SUT SILK 2 0 SH (SUTURE) ×3 IMPLANT
SUT SILK 2 0 SH CR/8 (SUTURE) ×3 IMPLANT
SUT SILK 3 0 (SUTURE)
SUT SILK 3-0 18XBRD TIE 12 (SUTURE) IMPLANT
SUT VIC AB 3-0 SH 27 (SUTURE) ×2
SUT VIC AB 3-0 SH 27X BRD (SUTURE) ×1 IMPLANT
SUT VICRYL 4-0 PS2 18IN ABS (SUTURE) ×3 IMPLANT
SYR 10ML LL (SYRINGE) ×9 IMPLANT
SYR 20CC LL (SYRINGE) ×3 IMPLANT
SYR 5ML LL (SYRINGE) ×3 IMPLANT
SYR CONTROL 10ML LL (SYRINGE) IMPLANT
SYSTEM TRANSCAROTID NEUROPRTCT (MISCELLANEOUS) ×1 IMPLANT
TOWEL GREEN STERILE (TOWEL DISPOSABLE) ×3 IMPLANT
TRANSCAROTID NEUROPROTECT SYS (MISCELLANEOUS) ×3
TUBING ART PRESS 48 MALE/FEM (TUBING) IMPLANT
TUBING EXTENTION W/L.L. (IV SETS) IMPLANT
WATER STERILE IRR 1000ML POUR (IV SOLUTION) ×3 IMPLANT
WIRE AMPLATZ SS-J .035X180CM (WIRE) IMPLANT
WIRE BENTSON .035X145CM (WIRE) ×3 IMPLANT

## 2018-03-12 NOTE — Telephone Encounter (Signed)
Sched. Appt. LVM mailed letter 04/03/18 [facility] 2wks dialysis Duplex + p/o MD s/p TCAR 10am dialysis duplex  11:30am p/o MD

## 2018-03-12 NOTE — Transfer of Care (Signed)
Immediate Anesthesia Transfer of Care Note  Patient: Daniel Simon  Procedure(s) Performed: TRANSCAROTID ARTERY REVASCULARIZATION RIGHT (Right Neck)  Patient Location: PACU  Anesthesia Type:General  Level of Consciousness: awake, alert  and patient cooperative  Airway & Oxygen Therapy: Patient Spontanous Breathing and Patient connected to nasal cannula oxygen  Post-op Assessment: Report given to RN and Post -op Vital signs reviewed and stable  Post vital signs: Reviewed and stable  Last Vitals:  Vitals Value Taken Time  BP 78/51 03/12/2018  9:23 AM  Temp    Pulse 87 03/12/2018  9:26 AM  Resp 12 03/12/2018  9:26 AM  SpO2 95 % 03/12/2018  9:26 AM  Vitals shown include unvalidated device data.  Last Pain:  Vitals:   03/12/18 0552  TempSrc:   PainSc: 0-No pain         Complications: No apparent anesthesia complications

## 2018-03-12 NOTE — Anesthesia Postprocedure Evaluation (Signed)
Anesthesia Post Note  Patient: Daniel Simon  Procedure(s) Performed: TRANSCAROTID ARTERY REVASCULARIZATION RIGHT (Right Neck)     Patient location during evaluation: PACU Anesthesia Type: General Level of consciousness: sedated Pain management: pain level controlled Vital Signs Assessment: post-procedure vital signs reviewed and stable Respiratory status: spontaneous breathing and respiratory function stable Cardiovascular status: stable Postop Assessment: no apparent nausea or vomiting Anesthetic complications: no    Last Vitals:  Vitals:   03/12/18 1018 03/12/18 1022  BP: 94/63 93/62  Pulse: 70 74  Resp: 15 11  Temp:    SpO2: 96% 98%    Last Pain:  Vitals:   03/12/18 1000  TempSrc:   PainSc: 0-No pain    LLE Motor Response: Purposeful movement (03/12/18 1030) LLE Sensation: Full sensation;No numbness;No tingling (03/12/18 1030)          [place]

## 2018-03-12 NOTE — Anesthesia Procedure Notes (Signed)
Procedure Name: Intubation Date/Time: 03/12/2018 7:47 AM Performed by: Shirlyn Goltz, CRNA Pre-anesthesia Checklist: Patient identified, Emergency Drugs available, Suction available and Patient being monitored Patient Re-evaluated:Patient Re-evaluated prior to induction Oxygen Delivery Method: Circle system utilized Preoxygenation: Pre-oxygenation with 100% oxygen Induction Type: IV induction Ventilation: Mask ventilation without difficulty Laryngoscope Size: Mac and 4 Grade View: Grade I Tube type: Oral Tube size: 7.0 mm Number of attempts: 1 Airway Equipment and Method: Stylet Placement Confirmation: ETT inserted through vocal cords under direct vision,  positive ETCO2 and breath sounds checked- equal and bilateral Secured at: 20 cm Tube secured with: Tape Dental Injury: Teeth and Oropharynx as per pre-operative assessment

## 2018-03-12 NOTE — Op Note (Addendum)
OPERATIVE NOTE   PROCEDURE: 1. Right transcarotid artery revascularization (TCAR)  PRE-OPERATIVE DIAGNOSIS: right brain stroke  POST-OPERATIVE DIAGNOSIS: same as above   SURGEON: Adele Barthel, MD  ASSISTANT(S): Servando Snare, MD  ANESTHESIA: general  ESTIMATED BLOOD LOSS: 50 cc  CONTRAST: 20 cc  FINDING(S): 1.  >50% lesion in proximal internal carotid artery: resolution after stenting  SPECIMEN(S):  none  INDICATIONS:   Daniel Simon is a 83 y.o. male who presents with right > left hemisphere stroke.  Both internal carotid arteries were found to have possible soft lesions that might be responsible for his stroke.  I recommended proceeding first with right transcarotid artery revascularization (TCAR) given his severe coronary artery disease.  I discussed with the patient that TCAR is an open carotid exposure with direct carotid cannulation for carotid stent placement.  Flow reversal is used to limited distal embolization, so clamping the common carotid artery is necessary.  The patient is aware of that the risks of this operation include but are not limited to: bleeding, infection, access site complications, renal failure, embolization, rupture of vessel, dissection, arteriovenous fistula, possible need for emergent surgical intervention, possible need for surgical procedures to treat the patient's pathology, anaphylactic reaction to contrast, vagus nerve injury, and stroke and death.  The patient is aware of the risks and agrees to proceed.   DESCRIPTION: After obtaining full informed written consent, the patient was brought back to the operating room and placed supine upon the operating table.  The patient received IV antibiotics prior to induction.  A procedure time out was completed and the correct surgical site was verified.  The patient's preoperative use of Plavix, Aspirin and Lipitor were verified in the chart.  The operative plan including use of prophylactic  glycopyrrolate 0.3 mg and keep systolic blood pressure over 140 mm Hg were discussed.   After obtaining adequate anesthesia, the patient was prepped and draped in the standard fashion for: right transcarotid artery revascularization.  I identified the right common carotid artery under Sonosite guidance near the right clavicle.  The patient was given then glycopyrrolate and 6000 units of Heparin which was a therapeutic bolus.  I made a longitudinal incision over the proximal common carotid artery in the right neck.  Using electrocautery, I dissected out the proximal common carotid artery circumferentially near the clavicle.  I intentionally left the rest of the common carotid artery adherent to the surrounding tissue, dissecting out only the anterior surface.  I placed a looped vessel loop around the proximal common carotid artery.  Meanwhile, Dr. Donzetta Matters cannulated the left common femoral vein under Sonosite guidance with a 18 gauge needle.  A Bentson wire was loaded into the left common femoral vein. The needle was exchanged for the venous sheath.  The sheath was aspirated and loaded with heparinized saline.  The wire was removed.   An ACT was drawn which was found to be 260 seconds.  I placed a U-stitch of 6-0 Prolene around the proposed cannulation site in the common carotid artery.  The proximal common carotid artery was place under gentle tension with an umbilical tape also placed proximally.  I cannulated the common carotid artery with a micropuncture needle and then inserted the microwire 3 cm into the distal common carotid artery.  I exchanged the needle for the microsheath.  The wire and dilator were removed.  I did a hand injection in anteroposterior orientation, demonstrating the internal carotid artery lesion >50%.  The intraoperative images were compatible  with the CTA findings.   The short Amplatz wire was loaded into the distal common carotid artery, stopping short to the carotid bifurcation.  I  exchanged the sheath for the TCAR sheath, which was loaded into the right common carotid artery without difficulty.  The wire was removed.  The side port was backbled and the sheath loaded with heparinized saline.  The side port of the sheath was connected to the flow reversal filter circuit.  I backbled through the flow reversal circuit.  I halted flow and connected the circuit to the venous sheath.  I backbled the venous sheath and then loaded the sheath with heparinized saline.  I shot two views of the right carotid artery while slowing injecting and holding the flow reversal.  At this point, the TCAR timeout was completed.  All of the endovascular equipment needed was on the table.  The ACT was therapeutic.  The systolic blood pressure was over 160 mm Hg.  I clamped the proximal common carotid artery by putting it under tension.  The flow reversal circuit was placed on high and the flow reversal was tested by flushing the venous sheath and then visualized blood flow through the sheath.  The flow appeared to be slower than usual.  There was no obvious issues with the circuit, so I decided to proceed.  The previously preloaded 0.014" wire and balloon were advanced into the common carotid artery.  Using the wire, I selected the right internal carotid artery and advanced the wire into the cranium until the genu was visualized.  I advanced the 6 mm x 30 mm angioplasty balloon into the right internal carotid artery and inflated to nominal pressure and then immediately deflated.  This was exchanged rapidly for a 10 mm x 30 mm ENROUTE stent which was loaded over the wire and deployed at the same site of the angioplasty.   The stent delivery system was removed.  Completion injection demonstrated adequate expansion of the carotid stent.    The wire was removed.  I disconnected the flow reversal circuit and emptied the blood back into the left femoral sheath.  The venous sheath was removed and pressure held to the  left groin for 5 minutes.  A dry dressing was applied to this puncture site.  The right carotid sheath was removed while tying down.  I placed one additional stitch to tighten up the arteriotomy.  The patient was given 40 mg of Protamine to reverse anticoagulation.  I packed the wound with Avitene.  After a few minutes and changing out the Avitene a few times, no further bleeding was present.  The right carotid exposure was closed with a layer of 3-0 Vicryl in the platysmas muscle.  The skin was reapproximated with a running subcuticular stitch of 4-0 Monocryl.  The skin was cleaned, dried, and reinforced with Dermabond.  The patient woke up without any neurologic deficits.   COMPLICATIONS: none  CONDITION: stable   Adele Barthel, MD, [facility] [place] Office: [phone] Pager: [phone]  03/12/2018, 9:00 AM

## 2018-03-12 NOTE — Interval H&P Note (Signed)
History and Physical Interval Note:  03/12/2018 6:47 AM  Daniel Simon  has presented today for surgery, with the diagnosis of BILATERAL CAROTID STENOSIS  The various methods of treatment have been discussed with the patient and family. After consideration of risks, benefits and other options for treatment, the patient has consented to  Procedure(s): TRANSCAROTID ARTERY REVASCULARIZATION RIGHT (Right) as a surgical intervention .  The patient's history has been reviewed, patient examined, no change in status, stable for surgery.  I have reviewed the patient's chart and labs.  Questions were answered to the patient's satisfaction.     Adele Barthel

## 2018-03-12 NOTE — Anesthesia Procedure Notes (Signed)
Arterial Line Insertion Start/End8/03/2018 7:00 AM, 03/12/2018 7:05 AM Performed by: Shirlyn Goltz, CRNA, CRNA  Patient location: Pre-op. Preanesthetic checklist: patient identified, IV checked, site marked, risks and benefits discussed, surgical consent, monitors and equipment checked, pre-op evaluation and timeout performed Lidocaine 1% used for infiltration Right, radial was placed Catheter size: 20 G Hand hygiene performed , maximum sterile barriers used  and Seldinger technique used Allen's test indicative of satisfactory collateral circulation Attempts: 1 Procedure performed without using ultrasound guided technique. Following insertion, dressing applied and Biopatch. Post procedure assessment: normal

## 2018-03-12 NOTE — Progress Notes (Signed)
Pt arrived to 4e from PACU. Right neck incision with skin glue, open to air. Vitals obtained. Right radial a-line in place. Telemetry box applied and CCMD notified. Pt oriented to room and staff. Pt denies needs at this time. Will continue current plan of care.   Ara Kussmaul BSN, RN

## 2018-03-12 NOTE — Discharge Instructions (Signed)
   [facility] [place]  Discharge Instructions   Carotid Surgery  Please refer to the following instructions for your post-procedure care. Your surgeon or physician assistant will discuss any changes with you.  Activity  You are encouraged to walk as much as you can. You can slowly return to normal activities but must avoid strenuous activity and heavy lifting until your doctor tell you it's okay. Avoid activities such as vacuuming or swinging a golf club. You can drive after one week if you are comfortable and you are no longer taking prescription pain medications. It is normal to feel tired for serval weeks after your surgery. It is also normal to have difficulty with sleep habits, eating, and bowel movements after surgery. These will go away with time.  Bathing/Showering  Shower daily after you go home. Do not soak in a bathtub, hot tub, or swim until the incision heals completely.  Incision Care  Shower every day. Clean your incision with mild soap and water. Pat the area dry with a clean towel. You do not need a bandage unless otherwise instructed. Do not apply any ointments or creams to your incision. You may have skin glue on your incision. Do not peel it off. It will come off on its own in about one week. Your incision may feel thickened and raised for several weeks after your surgery. This is normal and the skin will soften over time.   For Men Only: It's okay to shave around the incision but do not shave the incision itself for 2 weeks. It is common to have numbness under your chin that could last for several months.  Diet  Resume your normal diet. There are no special food restrictions following this procedure. A low fat/low cholesterol diet is recommended for all patients with vascular disease. In order to heal from your surgery, it is CRITICAL to get adequate nutrition. Your body requires vitamins, minerals, and protein. Vegetables are the best source of  vitamins and minerals. Vegetables also provide the perfect balance of protein. Processed food has little nutritional value, so try to avoid this.  Medications  Resume taking all of your medications unless your doctor or physician assistant tells you not to. If your incision is causing pain, you may take over-the- counter pain relievers such as acetaminophen (Tylenol). If you were prescribed a stronger pain medication, please be aware these medications can cause nausea and constipation. Prevent nausea by taking the medication with a snack or meal. Avoid constipation by drinking plenty of fluids and eating foods with a high amount of fiber, such as fruits, vegetables, and grains.   Do not take Tylenol if you are taking prescription pain medications.  Follow Up  Our office will schedule a follow up appointment 2-3 weeks following discharge.  Please call us immediately for any of the following conditions  . Increased pain, redness, drainage (pus) from your incision site. . Fever of 101 degrees or higher. . If you should develop stroke (slurred speech, difficulty swallowing, weakness on one side of your body, loss of vision) you should call 911 and go to the nearest emergency room. .  Reduce your risk of vascular disease:  . Stop smoking. If you would like help call [facility] at 1-800-QUIT-NOW (1-800-784-8669) or [place] at 336-586-4000. . Manage your cholesterol . Maintain a desired weight . Control your diabetes . Keep your blood pressure down .  If you have any questions, please call the office at [phone]. 

## 2018-03-12 NOTE — Addendum Note (Signed)
Addendum  created 03/12/18 1039 by Valda Favia, CRNA   Intraprocedure Flowsheets edited

## 2018-03-13 ENCOUNTER — Encounter (HOSPITAL_COMMUNITY): Payer: Self-pay | Admitting: Vascular Surgery

## 2018-03-13 LAB — BASIC METABOLIC PANEL
ANION GAP: 10 (ref 5–15)
BUN: 29 mg/dL — ABNORMAL HIGH (ref 8–23)
CHLORIDE: 110 mmol/L (ref 98–111)
CO2: 23 mmol/L (ref 22–32)
Calcium: 9.3 mg/dL (ref 8.9–10.3)
Creatinine, Ser: 1.33 mg/dL — ABNORMAL HIGH (ref 0.61–1.24)
GFR calc non Af Amer: 48 mL/min — ABNORMAL LOW (ref >60)
GFR, EST AFRICAN AMERICAN: 56 mL/min — AB (ref >60)
Glucose, Bld: 136 mg/dL — ABNORMAL HIGH (ref 70–99)
POTASSIUM: 5 mmol/L (ref 3.5–5.1)
Sodium: 143 mmol/L (ref 135–145)

## 2018-03-13 LAB — CBC
HEMATOCRIT: 37.2 % — AB (ref 39.0–52.0)
HEMOGLOBIN: 11.9 g/dL — AB (ref 13.0–17.0)
MCH: 29.1 pg (ref 26.0–34.0)
MCHC: 32 g/dL (ref 30.0–36.0)
MCV: 91 fL (ref 78.0–100.0)
Platelets: 81 10*3/uL — ABNORMAL LOW (ref 150–400)
RBC: 4.09 MIL/uL — ABNORMAL LOW (ref 4.22–5.81)
RDW: 14.5 % (ref 11.5–15.5)
WBC: 7.1 10*3/uL (ref 4.0–10.5)

## 2018-03-13 MED ORDER — OXYCODONE-ACETAMINOPHEN 5-325 MG PO TABS: 1.0000 | ORAL_TABLET | Freq: Four times a day (QID) | ORAL | 0 refills | Status: DC | PRN

## 2018-03-13 NOTE — Care Management Note (Addendum)
Case Management Note  Patient Details  Name: Daniel Simon MRN: [id number] Date of Birth: 1935/07/11  Subjective/Objective:  83yo patient presented for a scheduled Left TCAR. Patient is independent and lives at home with spouse.                Action/Plan: CM met with patient/spouse to discuss transitional needs. HH was arranged with [facility] for an [facility] for post hospital transitional needs PTA. [facility] will f/u post discharge for any additional home health needs. CM updated [facility] of patient's orders to transition home today, with an [facility] start of care scheduled on 03/14/18. CM will sign off.   Expected Discharge Date:  03/13/18               Expected Discharge Plan:  [place]  In-House Referral:  NA  Discharge planning Services  CM Consult  Post Acute Care Choice:  NA Choice offered to:  NA  DME Arranged:  N/A DME Agency:  NA  HH Arranged:  NA HH Agency:  [facility]  Status of Service:  Completed, signed off  If discussed at [place] of Stay Meetings, dates discussed:    Additional Comments:  Georgeanna Lea, RN 03/13/2018, 9:35 AM

## 2018-03-13 NOTE — Discharge Summary (Signed)
Physician Discharge Summary  Patient ID: Daniel Simon MRN: [id number] DOB/AGE: 08-20-1934 83 y.o.  Admit date: 03/12/2018 Discharge date: 03/13/2018   Admission Diagnosis: R CVA Sx R ICA stenosis >50%  Discharge Diagnoses:  same as above   Secondary Diagnoses: Past Medical History:  Diagnosis Date  . CAD (coronary artery disease)    a. 02/2018: 3-vessel CAD by cath --> not felt to be a CABG candidate, therefore s/p DES to proximal LAD.   [facility])    prostate  . Carotid stenosis, right   . COPD (chronic obstructive pulmonary disease) ([place])   . History of kidney stones   . Hyperlipidemia LDL goal <70   . Hypertension   . Myocardial infarction ([place])   . Stroke [facility])    Procedures: R TCAR (03/12/18)  Discharged Condition: good  Hospital Course:  Daniel Simon is a 83 y.o. (1934-10-21) male with R > L CVA with R ICA stenosis >50% and L ICA stenosis >70%.  Patient s/p recent DES PCI.  I recommended: R TCAR.  The patient underwent a unremarkable R TCAR.  The patient's post-operative course was unremarkable.  This patient will follow with Dr. Donzetta Matters in 2 weeks for post-operative evaluation and evaluation for L TCAR  Significant Diagnostic Studies: CBC CBC Latest Ref Rng & Units 03/13/2018 03/09/2018 02/13/2018  WBC 4.0 - 10.5 K/uL 7.1 6.9 5.9  Hemoglobin 13.0 - 17.0 g/dL 11.9(L) 14.1 13.1  Hematocrit 39.0 - 52.0 % 37.2(L) 44.3 39.8  Platelets 150 - 400 K/uL 81(L) 152 85(L)     BMET @LASTCHEMISTRY @  COAG Lab Results  Component Value Date   INR 1.14 03/09/2018   INR 1.06 12/24/2017   No results found for: PTT  Disposition: Discharge disposition: 01-Home or Self Care        Allergies as of 03/13/2018   No Known Allergies     Medication List    TAKE these medications   albuterol 108 (90 Base) MCG/ACT inhaler Commonly known as:  PROVENTIL HFA;VENTOLIN HFA Inhale 2 puffs into the lungs every 6 (six) hours as needed for wheezing or shortness of breath.    aspirin EC 81 MG tablet Take 81 mg by mouth daily.   atorvastatin 80 MG tablet Commonly known as:  LIPITOR Take 1 tablet (80 mg total) by mouth daily at 6 PM.   carvedilol 6.25 MG tablet Commonly known as:  COREG Take 1 tablet (6.25 mg total) by mouth 2 (two) times daily with a meal.   clopidogrel 75 MG tablet Commonly known as:  PLAVIX Take 1 tablet (75 mg total) by mouth daily with breakfast.   furosemide 40 MG tablet Commonly known as:  LASIX Take 1 tablet (40 mg total) by mouth daily.   isosorbide mononitrate 30 MG 24 hr tablet Commonly known as:  IMDUR Take 1 tablet (30 mg total) by mouth daily.   lisinopril 5 MG tablet Commonly known as:  PRINIVIL,ZESTRIL Take 1 tablet (5 mg total) by mouth daily.   naproxen sodium 220 MG tablet Commonly known as:  ALEVE Take 440 mg by mouth daily as needed (pain).   nitroGLYCERIN 0.4 MG SL tablet Commonly known as:  NITROSTAT Place 1 tablet (0.4 mg total) under the tongue every 5 (five) minutes as needed for chest pain.   oxyCODONE-acetaminophen 5-325 MG tablet Commonly known as:  PERCOCET/ROXICET Take 1 tablet by mouth every 6 (six) hours as needed for moderate pain.   Potassium Chloride ER 20 MEQ Tbcr TAKE 1 TABLET  BY MOUTH ONCE DAILY   SPIRIVA HANDIHALER 18 MCG inhalation capsule Generic drug:  tiotropium INHALE 1 CAPSULE VIA HANDIHALER ONCE DAILY AT THE SAME TIME EVERY DAY      Follow-up Information    Waynetta Sandy, MD In 4 weeks.   Specialties:  Vascular Surgery, Cardiology Why:  Office will call you to arrange your appt (sent) Contact information: [place] [phone]           Signed:  Adele Barthel, MD, FACS [facility] [place] Office: [phone] Pager: [phone]  03/13/2018, 7:38 AM   --- For VQI Registry use --- Instructions: Press F2 to tab through selections.  Delete question if not applicable.   Modified Rankin score at D/C  (0-6): Rankin Score=0  IV medication needed for:  1. Hypertension: No 2. Hypotension: No  Post-op Complications: No  1. Post-op CVA or TIA: No  2. CN injury: No  3. Myocardial infarction: No  4.  CHF: No  5.  Dysrhythmia (new): No  6. Wound infection: No  7. Reperfusion symptoms: No  8. Return to OR: No   Discharge medications: Statin use:  Yes ASA use:  Yes Beta blocker use:  Yes ACE-Inhibitor use:  No P2Y12 Antagonist use: [x ] None, [ ]  Plavix, [ ]  Plasugrel, [ ]  Ticlopinine, [ ]  Ticagrelor, [ ]  Other, [ ]  No for medical reason, [ ]  Non-compliant, [ ]  Not-indicated Anti-coagulant use:  [x ] None, [ ]  Warfarin, [ ]  Rivaroxaban, [ ]  Dabigatran, [ ]  Other, [ ]  No for medical reason, [ ]  Non-compliant, [ ]  Not-indicated

## 2018-03-13 NOTE — Progress Notes (Addendum)
   Daily Progress Note   Assessment/Planning:   POD #1 s/p R TCAR   Neuro intact  No neck hematoma  Ok to D/C  F/U w/ Dr. Donzetta Matters in 2 weeks.  Eval for L TCAR in 2 weeks   Subjective  - 1 Day Post-Op   No complaints, no events overnight, pt has eaten overnight   Objective   Vitals:   03/13/18 0036 03/13/18 0100 03/13/18 0200 03/13/18 0500  BP: (!) 120/56   126/61  Pulse: 65     Resp: 18 18 14 17   Temp: 98.3 F (36.8 C)   98.5 F (36.9 C)  TempSrc: Oral   Oral  SpO2: 93%   94%  Weight:      Height:         Intake/Output Summary (Last 24 hours) at 03/13/2018 0742 Last data filed at 03/12/2018 1720 Gross per 24 hour  Intake 1361.6 ml  Output 50 ml  Net 1311.6 ml    PULM  CTAB  CV  RRR  GI  soft, NTND  VASC R neck: no hematoma, inc c/d/i  NEURO CN intact, motor sym 5/5, tongue midline    Laboratory   CBC CBC Latest Ref Rng & Units 03/13/2018 03/09/2018 02/13/2018  WBC 4.0 - 10.5 K/uL 7.1 6.9 5.9  Hemoglobin 13.0 - 17.0 g/dL 11.9(L) 14.1 13.1  Hematocrit 39.0 - 52.0 % 37.2(L) 44.3 39.8  Platelets 150 - 400 K/uL 81(L) 152 85(L)    BMET    Component Value Date/Time   NA 143 03/13/2018 0357   K 5.0 03/13/2018 0357   CL 110 03/13/2018 0357   CO2 23 03/13/2018 0357   GLUCOSE 136 (H) 03/13/2018 0357   BUN 29 (H) 03/13/2018 0357   CREATININE 1.33 (H) 03/13/2018 0357   CALCIUM 9.3 03/13/2018 0357   GFRNONAA 48 (L) 03/13/2018 0357   GFRAA 56 (L) 03/13/2018 0357     Adele Barthel, MD, FACS [facility] [place] Office: [phone] Pager: [phone]  03/13/2018, 7:42 AM

## 2018-03-13 NOTE — Progress Notes (Signed)
Pt/family given discharge instructions, medication lists, follow up appointments, and when to call the doctor.  Pt/family verbalizes understanding. Pt given signs and symptoms of infection. Charonda Hefter McClintock, RN    

## 2018-03-16 ENCOUNTER — Other Ambulatory Visit: Payer: Self-pay

## 2018-03-16 DIAGNOSIS — I6523 Occlusion and stenosis of bilateral carotid arteries: Secondary | ICD-10-CM

## 2018-03-20 ENCOUNTER — Other Ambulatory Visit: Payer: Self-pay | Admitting: Cardiology

## 2018-04-03 ENCOUNTER — Encounter: Payer: Self-pay | Admitting: Vascular Surgery

## 2018-04-03 ENCOUNTER — Ambulatory Visit (HOSPITAL_COMMUNITY)
Admission: RE | Admit: 2018-04-03 | Discharge: 2018-04-03 | Disposition: A | Discharge status: Home / Self Care | Payer: Medicare Other | Source: Ambulatory Visit | Attending: Vascular Surgery | Admitting: Vascular Surgery

## 2018-04-03 ENCOUNTER — Ambulatory Visit (INDEPENDENT_AMBULATORY_CARE_PROVIDER_SITE_OTHER): Payer: Medicare Other | Admitting: Vascular Surgery

## 2018-04-03 VITALS — BP 110/60 | HR 64 | Temp 97.0°F | Resp 16 | Ht 67.0 in | Wt 132.0 lb

## 2018-04-03 DIAGNOSIS — I6523 Occlusion and stenosis of bilateral carotid arteries: Secondary | ICD-10-CM | POA: Diagnosis not present

## 2018-04-03 NOTE — Progress Notes (Signed)
Patient ID: Daniel Simon, male   DOB: July 18, 1935, 83 y.o.   MRN: [id number]  Reason for Consult: Carotid (2 wk f/u Carotid.  S/p TCAR. )   Referred by Dione Housekeeper, MD  Subjective:     HPI:  Daniel Simon is a 83 y.o. male with history of bilateral CVA.  He has recently undergone right sided trans-carotid artery stenting given that he presented with symptoms on the left upper extremity.  He now follows up with carotid duplex for further evaluation.  He has not had any further neurologic events.  He remains on his aspirin statin and Plavix.  He is not having any issues with his incision.  Past Medical History:  Diagnosis Date  . CAD (coronary artery disease)    a. 02/2018: 3-vessel CAD by cath --> not felt to be a CABG candidate, therefore s/p DES to proximal LAD.   Marland Kitchen Cancer [facility])    prostate  . Carotid stenosis, right   . COPD (chronic obstructive pulmonary disease) ([place])   . History of kidney stones   . Hyperlipidemia LDL goal <70   . Hypertension   . Myocardial infarction ([place])   . Stroke [facility])    Family History  Problem Relation Age of Onset  . Cancer Sister   . Cancer Sister   . Heart attack Brother   . Heart disease Brother    Past Surgical History:  Procedure Laterality Date  . CARDIAC CATHETERIZATION    . CORONARY STENT INTERVENTION N/A 02/12/2018   Procedure: CORONARY STENT INTERVENTION;  Surgeon: Belva Crome, MD;  Location: [place] [facility];  Service: Cardiovascular;  Laterality: N/A;  . LEFT HEART CATH AND CORONARY ANGIOGRAPHY N/A 02/10/2018   Procedure: LEFT HEART CATH AND CORONARY ANGIOGRAPHY;  Surgeon: Belva Crome, MD;  Location: [place] [facility];  Service: Cardiovascular;  Laterality: N/A;  . PROSTATE BIOPSY    . TRANSCAROTID ARTERY REVASCULARIZATION (TCAR)  03/12/2018  . TRANSCAROTID ARTERY REVASCULARIZATION Right 03/12/2018   Procedure: TRANSCAROTID ARTERY REVASCULARIZATION RIGHT;  Surgeon: Conrad [place], MD;  Location: [facility] OR;  Service:  Vascular;  Laterality: Right;    Short Social History:  Social History   Tobacco Use  . Smoking status: Current Every Day Smoker    Packs/day: 1.00    Types: Cigarettes    Start date: 09/24/1948  . Smokeless tobacco: Never Used  Substance Use Topics  . Alcohol use: Not Currently    Frequency: Never    No Known Allergies  Current Outpatient Medications  Medication Sig Dispense Refill  . albuterol (PROVENTIL HFA;VENTOLIN HFA) 108 (90 Base) MCG/ACT inhaler Inhale 2 puffs into the lungs every 6 (six) hours as needed for wheezing or shortness of breath. 1 Inhaler 2  . aspirin EC 81 MG tablet Take 81 mg by mouth daily.    Marland Kitchen atorvastatin (LIPITOR) 80 MG tablet Take 1 tablet (80 mg total) by mouth daily at 6 PM. 90 tablet 3  . carvedilol (COREG) 6.25 MG tablet Take 1 tablet (6.25 mg total) by mouth 2 (two) times daily with a meal. 60 tablet 1  . clopidogrel (PLAVIX) 75 MG tablet Take 1 tablet (75 mg total) by mouth daily with breakfast. 90 tablet 3  . furosemide (LASIX) 40 MG tablet Take 1 tablet (40 mg total) by mouth daily. 30 tablet 11  . isosorbide mononitrate (IMDUR) 30 MG 24 hr tablet Take 1 tablet (30 mg total) by mouth daily. 90 tablet 3  . nitroGLYCERIN (NITROSTAT)  0.4 MG SL tablet Place 1 tablet (0.4 mg total) under the tongue every 5 (five) minutes as needed for chest pain. 25 tablet 3  . Potassium Chloride ER 20 MEQ TBCR TAKE 1 TABLET BY MOUTH ONCE DAILY 90 tablet 3  . SPIRIVA HANDIHALER 18 MCG inhalation capsule INHALE 1 CAPSULE VIA HANDIHALER ONCE DAILY AT THE SAME TIME EVERY DAY 30 capsule 2   No current facility-administered medications for this visit.     Review of Systems  Constitutional:  Constitutional negative. Eyes: Eyes negative.  Respiratory: Positive for shortness of breath.  Cardiovascular: Positive for dyspnea with exertion.  GI: Gastrointestinal negative.  Musculoskeletal: Musculoskeletal negative.  Skin: Skin negative.  Neurological: Neurological  negative. Hematologic: Hematologic/lymphatic negative.  Psychiatric: Psychiatric negative.        Objective:  Objective   Vitals:   04/03/18 1111 04/03/18 1113  BP: 124/69 110/60  Pulse: 64   Resp: 16   Temp: (!) 97 F (36.1 C)   TempSrc: Oral   SpO2: 97%   Weight: 132 lb (59.9 kg)   Height: 5\' 7"  (1.702 m)    Body mass index is 20.67 kg/m.  Physical Exam  Constitutional: He is oriented to person, place, and time. He appears well-developed.  HENT:  Head: Normocephalic.  Neck: Neck supple.  Well-healing right neck incision  Cardiovascular: Normal rate.  Pulmonary/Chest: Effort normal.  Neurological: He is alert and oriented to person, place, and time. No cranial nerve deficit.  Skin: Skin is warm and dry.    Data: I have interpreted his carotid duplex today which is normal findings on the right with no in-stent stenosis in the left side is 60 to 79% stenosis by velocity with heterogeneous plaque.  I also reviewed the patient's CT scan with the family demonstrating his high-grade stenosis greater than 70% by CT angios without significant calcification.     Assessment/Plan:     83 year old male follows up after right trans-carotid artery stenting.  He is now being considered for the left side given that he had bilateral strokes on imaging of his brain with high-grade stenosis bilaterally carotids.  Appears to be a candidate for left trans-carotid artery stenting we will get him set up for this in the near future.  Continue aspirin Plavix and statin.     Waynetta Sandy MD [facility]

## 2018-04-22 ENCOUNTER — Other Ambulatory Visit: Payer: Self-pay | Admitting: *Deleted

## 2018-04-26 ENCOUNTER — Other Ambulatory Visit: Payer: Self-pay | Admitting: Cardiology

## 2018-04-29 ENCOUNTER — Other Ambulatory Visit: Payer: Self-pay

## 2018-04-29 ENCOUNTER — Encounter (HOSPITAL_COMMUNITY): Payer: Self-pay | Admitting: *Deleted

## 2018-04-29 NOTE — Progress Notes (Signed)
SDW-Pre-op call completed by pt spouse, Doris (DPR). Spouse denies that pt C/O SOB and chest pain. Spouse stated that pt is under the care of Dr. Harl Bowie, Cardiology. Spouse stated that pt history remains the same since the last procedure in August. Spouse made aware to have pt stop taking vitamins, fish oil and herbal medications. Do not take any NSAIDs ie: Ibuprofen, Advil, Naproxen (Aleve), Motrin, BC and Goody Powder. Spouse verbalized understanding of all pre-op instructions.

## 2018-04-30 ENCOUNTER — Inpatient Hospital Stay (HOSPITAL_COMMUNITY)
Admission: RE | Admit: 2018-04-30 | Discharge: 2018-05-01 | DRG: 038 | Disposition: A | Discharge status: Home Health | Payer: Medicare Other | Source: Ambulatory Visit | Attending: Vascular Surgery | Admitting: Vascular Surgery

## 2018-04-30 ENCOUNTER — Inpatient Hospital Stay (HOSPITAL_COMMUNITY): Payer: Medicare Other | Admitting: Anesthesiology

## 2018-04-30 ENCOUNTER — Encounter (HOSPITAL_COMMUNITY): Payer: Self-pay | Admitting: Urology

## 2018-04-30 ENCOUNTER — Encounter (HOSPITAL_COMMUNITY)
Admission: RE | Disposition: A | Discharge status: Home Health | Payer: Self-pay | Source: Ambulatory Visit | Attending: Vascular Surgery

## 2018-04-30 DIAGNOSIS — I6522 Occlusion and stenosis of left carotid artery: Secondary | ICD-10-CM

## 2018-04-30 DIAGNOSIS — I252 Old myocardial infarction: Secondary | ICD-10-CM

## 2018-04-30 DIAGNOSIS — I712 Thoracic aortic aneurysm, without rupture: Secondary | ICD-10-CM | POA: Diagnosis present

## 2018-04-30 DIAGNOSIS — I5042 Chronic combined systolic (congestive) and diastolic (congestive) heart failure: Secondary | ICD-10-CM | POA: Diagnosis present

## 2018-04-30 DIAGNOSIS — Z8673 Personal history of transient ischemic attack (TIA), and cerebral infarction without residual deficits: Secondary | ICD-10-CM | POA: Diagnosis not present

## 2018-04-30 DIAGNOSIS — Z87442 Personal history of urinary calculi: Secondary | ICD-10-CM

## 2018-04-30 DIAGNOSIS — Z8546 Personal history of malignant neoplasm of prostate: Secondary | ICD-10-CM

## 2018-04-30 DIAGNOSIS — E785 Hyperlipidemia, unspecified: Secondary | ICD-10-CM | POA: Diagnosis present

## 2018-04-30 DIAGNOSIS — J449 Chronic obstructive pulmonary disease, unspecified: Secondary | ICD-10-CM | POA: Diagnosis present

## 2018-04-30 DIAGNOSIS — I110 Hypertensive heart disease with heart failure: Secondary | ICD-10-CM | POA: Diagnosis present

## 2018-04-30 DIAGNOSIS — F17200 Nicotine dependence, unspecified, uncomplicated: Secondary | ICD-10-CM | POA: Diagnosis present

## 2018-04-30 DIAGNOSIS — I2510 Atherosclerotic heart disease of native coronary artery without angina pectoris: Secondary | ICD-10-CM | POA: Diagnosis present

## 2018-04-30 DIAGNOSIS — I6529 Occlusion and stenosis of unspecified carotid artery: Secondary | ICD-10-CM | POA: Diagnosis present

## 2018-04-30 HISTORY — PX: TRANSCAROTID ARTERY REVASCULARIZATIONA: SHX6778

## 2018-04-30 LAB — CBC
HEMATOCRIT: 46.7 % (ref 39.0–52.0)
HEMOGLOBIN: 15.1 g/dL (ref 13.0–17.0)
MCH: 31.2 pg (ref 26.0–34.0)
MCHC: 32.3 g/dL (ref 30.0–36.0)
MCV: 96.5 fL (ref 78.0–100.0)
Platelets: 140 10*3/uL — ABNORMAL LOW (ref 150–400)
RBC: 4.84 MIL/uL (ref 4.22–5.81)
RDW: 16.1 % — ABNORMAL HIGH (ref 11.5–15.5)
WBC: 6.8 10*3/uL (ref 4.0–10.5)

## 2018-04-30 LAB — TYPE AND SCREEN
ABO/RH(D): A POS
Antibody Screen: NEGATIVE

## 2018-04-30 LAB — COMPREHENSIVE METABOLIC PANEL
ALK PHOS: 105 U/L (ref 38–126)
ALT: 13 U/L (ref 0–44)
ANION GAP: 13 (ref 5–15)
AST: 17 U/L (ref 15–41)
Albumin: 4.1 g/dL (ref 3.5–5.0)
BILIRUBIN TOTAL: 0.9 mg/dL (ref 0.3–1.2)
BUN: 19 mg/dL (ref 8–23)
CO2: 21 mmol/L — ABNORMAL LOW (ref 22–32)
Calcium: 9.4 mg/dL (ref 8.9–10.3)
Chloride: 109 mmol/L (ref 98–111)
Creatinine, Ser: 1.27 mg/dL — ABNORMAL HIGH (ref 0.61–1.24)
GFR, EST AFRICAN AMERICAN: 59 mL/min — AB (ref >60)
GFR, EST NON AFRICAN AMERICAN: 51 mL/min — AB (ref >60)
Glucose, Bld: 101 mg/dL — ABNORMAL HIGH (ref 70–99)
Potassium: 4.2 mmol/L (ref 3.5–5.1)
Sodium: 143 mmol/L (ref 135–145)
TOTAL PROTEIN: 7.3 g/dL (ref 6.5–8.1)

## 2018-04-30 LAB — BLOOD GAS, ARTERIAL
Acid-Base Excess: 0.6 mmol/L (ref 0.0–2.0)
Bicarbonate: 24.2 mmol/L (ref 20.0–28.0)
Drawn by: 421801
FIO2: 21
O2 Saturation: 93.6 %
PCO2 ART: 35.6 mmHg (ref 32.0–48.0)
PO2 ART: 65.5 mmHg — AB (ref 83.0–108.0)
Patient temperature: 98.6
pH, Arterial: 7.447 (ref 7.350–7.450)

## 2018-04-30 LAB — PROTIME-INR
INR: 1.1
Prothrombin Time: 14.1 seconds (ref 11.4–15.2)

## 2018-04-30 LAB — POCT ACTIVATED CLOTTING TIME: Activated Clotting Time: 285 seconds

## 2018-04-30 LAB — SURGICAL PCR SCREEN
MRSA, PCR: NEGATIVE
Staphylococcus aureus: NEGATIVE

## 2018-04-30 LAB — APTT: aPTT: 30 seconds (ref 24–36)

## 2018-04-30 SURGERY — TRANSCAROTID ARTERY REVASCULARIZATION (TCAR)
Anesthesia: General | Laterality: Left

## 2018-04-30 MED ORDER — SODIUM CHLORIDE 0.9 % IV SOLN
INTRAVENOUS | Status: DC | PRN
  Administered 2018-04-30: 40 ug/min via INTRAVENOUS

## 2018-04-30 MED ORDER — GUAIFENESIN-DM 100-10 MG/5ML PO SYRP: 15.0000 mL | ORAL_SOLUTION | ORAL | Status: DC | PRN

## 2018-04-30 MED ORDER — LACTATED RINGERS IV SOLN
INTRAVENOUS | Status: DC
  Administered 2018-04-30 (×2): via INTRAVENOUS

## 2018-04-30 MED ORDER — SUGAMMADEX SODIUM 200 MG/2ML IV SOLN
INTRAVENOUS | Status: DC | PRN
  Administered 2018-04-30: 200 mg via INTRAVENOUS

## 2018-04-30 MED ORDER — POTASSIUM CHLORIDE CRYS ER 20 MEQ PO TBCR: 20.0000 meq | EXTENDED_RELEASE_TABLET | Freq: Every day | ORAL | Status: DC | PRN

## 2018-04-30 MED ORDER — GLYCOPYRROLATE PF 0.2 MG/ML IJ SOSY
PREFILLED_SYRINGE | INTRAMUSCULAR | Status: AC
  Filled 2018-04-30: qty 3

## 2018-04-30 MED ORDER — PROPOFOL 10 MG/ML IV BOLUS
INTRAVENOUS | Status: DC | PRN
  Administered 2018-04-30: 110 mg via INTRAVENOUS

## 2018-04-30 MED ORDER — LIDOCAINE HCL (PF) 1 % IJ SOLN
INTRAMUSCULAR | Status: AC
  Filled 2018-04-30: qty 30

## 2018-04-30 MED ORDER — LABETALOL HCL 5 MG/ML IV SOLN: 10.0000 mg | INTRAVENOUS | Status: DC | PRN

## 2018-04-30 MED ORDER — OXYCODONE-ACETAMINOPHEN 5-325 MG PO TABS: 1.0000 | ORAL_TABLET | ORAL | Status: DC | PRN

## 2018-04-30 MED ORDER — MUPIROCIN 2 % EX OINT
TOPICAL_OINTMENT | CUTANEOUS | Status: AC
  Administered 2018-04-30: 1 via TOPICAL
  Filled 2018-04-30: qty 22

## 2018-04-30 MED ORDER — 0.9 % SODIUM CHLORIDE (POUR BTL) OPTIME
TOPICAL | Status: DC | PRN
  Administered 2018-04-30: 1000 mL

## 2018-04-30 MED ORDER — ONDANSETRON HCL 4 MG/2ML IJ SOLN
INTRAMUSCULAR | Status: AC
  Filled 2018-04-30: qty 2

## 2018-04-30 MED ORDER — POTASSIUM CHLORIDE CRYS ER 20 MEQ PO TBCR
20.0000 meq | EXTENDED_RELEASE_TABLET | Freq: Every day | ORAL | Status: DC
  Administered 2018-04-30 – 2018-05-01 (×2): 20 meq via ORAL
  Filled 2018-04-30 (×2): qty 1

## 2018-04-30 MED ORDER — FENTANYL CITRATE (PF) 250 MCG/5ML IJ SOLN
INTRAMUSCULAR | Status: AC
  Filled 2018-04-30: qty 5

## 2018-04-30 MED ORDER — SODIUM CHLORIDE 0.9 % IV SOLN: INTRAVENOUS | Status: DC

## 2018-04-30 MED ORDER — ALUM & MAG HYDROXIDE-SIMETH 200-200-20 MG/5ML PO SUSP: 15.0000 mL | ORAL | Status: DC | PRN

## 2018-04-30 MED ORDER — SODIUM CHLORIDE 0.9 % IV SOLN
INTRAVENOUS | Status: DC | PRN
  Administered 2018-04-30: 11:00:00

## 2018-04-30 MED ORDER — CEFAZOLIN SODIUM-DEXTROSE 2-4 GM/100ML-% IV SOLN
2.0000 g | Freq: Three times a day (TID) | INTRAVENOUS | Status: AC
  Administered 2018-04-30 – 2018-05-01 (×2): 2 g via INTRAVENOUS
  Filled 2018-04-30 (×2): qty 100

## 2018-04-30 MED ORDER — HEPARIN SODIUM (PORCINE) 1000 UNIT/ML IJ SOLN
INTRAMUSCULAR | Status: AC
  Filled 2018-04-30: qty 3

## 2018-04-30 MED ORDER — MAGNESIUM SULFATE 2 GM/50ML IV SOLN: 2.0000 g | Freq: Every day | INTRAVENOUS | Status: DC | PRN

## 2018-04-30 MED ORDER — PHENYLEPHRINE 40 MCG/ML (10ML) SYRINGE FOR IV PUSH (FOR BLOOD PRESSURE SUPPORT)
PREFILLED_SYRINGE | INTRAVENOUS | Status: DC | PRN
  Administered 2018-04-30: 40 ug via INTRAVENOUS

## 2018-04-30 MED ORDER — DOCUSATE SODIUM 100 MG PO CAPS
100.0000 mg | ORAL_CAPSULE | Freq: Every day | ORAL | Status: DC
  Administered 2018-05-01: 100 mg via ORAL
  Filled 2018-04-30 (×2): qty 1

## 2018-04-30 MED ORDER — ASPIRIN EC 81 MG PO TBEC
81.0000 mg | DELAYED_RELEASE_TABLET | Freq: Every day | ORAL | Status: DC
  Administered 2018-05-01: 81 mg via ORAL
  Filled 2018-04-30 (×2): qty 1

## 2018-04-30 MED ORDER — HEPARIN SODIUM (PORCINE) 1000 UNIT/ML IJ SOLN
INTRAMUSCULAR | Status: DC | PRN
  Administered 2018-04-30: 7000 [IU] via INTRAVENOUS

## 2018-04-30 MED ORDER — MORPHINE SULFATE (PF) 2 MG/ML IV SOLN: 2.0000 mg | INTRAVENOUS | Status: DC | PRN

## 2018-04-30 MED ORDER — FENTANYL CITRATE (PF) 100 MCG/2ML IJ SOLN
INTRAMUSCULAR | Status: DC | PRN
  Administered 2018-04-30: 100 ug via INTRAVENOUS

## 2018-04-30 MED ORDER — GLYCOPYRROLATE PF 0.2 MG/ML IJ SOSY
PREFILLED_SYRINGE | INTRAMUSCULAR | Status: DC | PRN
  Administered 2018-04-30 (×3): .1 mg via INTRAVENOUS

## 2018-04-30 MED ORDER — PHENOL 1.4 % MT LIQD: 1.0000 | OROMUCOSAL | Status: DC | PRN

## 2018-04-30 MED ORDER — CHLORHEXIDINE GLUCONATE CLOTH 2 % EX PADS: 6.0000 | MEDICATED_PAD | Freq: Once | CUTANEOUS | Status: DC

## 2018-04-30 MED ORDER — METOPROLOL TARTRATE 5 MG/5ML IV SOLN: 2.0000 mg | INTRAVENOUS | Status: DC | PRN

## 2018-04-30 MED ORDER — CEFAZOLIN SODIUM-DEXTROSE 2-4 GM/100ML-% IV SOLN
2.0000 g | INTRAVENOUS | Status: AC
  Administered 2018-04-30: 2 g via INTRAVENOUS
  Filled 2018-04-30: qty 100

## 2018-04-30 MED ORDER — ONDANSETRON HCL 4 MG/2ML IJ SOLN
INTRAMUSCULAR | Status: DC | PRN
  Administered 2018-04-30: 4 mg via INTRAVENOUS

## 2018-04-30 MED ORDER — ONDANSETRON HCL 4 MG/2ML IJ SOLN: 4.0000 mg | Freq: Four times a day (QID) | INTRAMUSCULAR | Status: DC | PRN

## 2018-04-30 MED ORDER — HYDRALAZINE HCL 20 MG/ML IJ SOLN: 5.0000 mg | INTRAMUSCULAR | Status: DC | PRN

## 2018-04-30 MED ORDER — ISOSORBIDE MONONITRATE ER 30 MG PO TB24
30.0000 mg | ORAL_TABLET | Freq: Every day | ORAL | Status: DC
  Administered 2018-05-01: 30 mg via ORAL
  Filled 2018-04-30 (×2): qty 1

## 2018-04-30 MED ORDER — ACETAMINOPHEN 325 MG PO TABS: 325.0000 mg | ORAL_TABLET | ORAL | Status: DC | PRN

## 2018-04-30 MED ORDER — IODIXANOL 320 MG/ML IV SOLN
INTRAVENOUS | Status: DC | PRN
  Administered 2018-04-30: 50 mL via INTRA_ARTERIAL

## 2018-04-30 MED ORDER — HEPARIN SODIUM (PORCINE) 1000 UNIT/ML IJ SOLN
INTRAMUSCULAR | Status: AC
  Filled 2018-04-30: qty 1

## 2018-04-30 MED ORDER — LIDOCAINE HCL (CARDIAC) PF 100 MG/5ML IV SOSY
PREFILLED_SYRINGE | INTRAVENOUS | Status: DC | PRN
  Administered 2018-04-30: 60 mg via INTRAVENOUS

## 2018-04-30 MED ORDER — PANTOPRAZOLE SODIUM 40 MG PO TBEC
40.0000 mg | DELAYED_RELEASE_TABLET | Freq: Every day | ORAL | Status: DC
  Administered 2018-04-30 – 2018-05-01 (×2): 40 mg via ORAL
  Filled 2018-04-30 (×2): qty 1

## 2018-04-30 MED ORDER — CLOPIDOGREL BISULFATE 75 MG PO TABS
75.0000 mg | ORAL_TABLET | Freq: Every day | ORAL | Status: DC
  Administered 2018-05-01: 75 mg via ORAL
  Filled 2018-04-30 (×2): qty 1

## 2018-04-30 MED ORDER — PROTAMINE SULFATE 10 MG/ML IV SOLN
INTRAVENOUS | Status: DC | PRN
  Administered 2018-04-30: 10 mg via INTRAVENOUS
  Administered 2018-04-30: 20 mg via INTRAVENOUS
  Administered 2018-04-30 (×2): 10 mg via INTRAVENOUS

## 2018-04-30 MED ORDER — DEXAMETHASONE SODIUM PHOSPHATE 10 MG/ML IJ SOLN
INTRAMUSCULAR | Status: DC | PRN
  Administered 2018-04-30: 10 mg via INTRAVENOUS

## 2018-04-30 MED ORDER — PROPOFOL 10 MG/ML IV BOLUS
INTRAVENOUS | Status: AC
  Filled 2018-04-30: qty 20

## 2018-04-30 MED ORDER — CARVEDILOL 6.25 MG PO TABS
6.2500 mg | ORAL_TABLET | Freq: Two times a day (BID) | ORAL | Status: DC
  Administered 2018-04-30 – 2018-05-01 (×2): 6.25 mg via ORAL
  Filled 2018-04-30 (×2): qty 1

## 2018-04-30 MED ORDER — DEXAMETHASONE SODIUM PHOSPHATE 10 MG/ML IJ SOLN
INTRAMUSCULAR | Status: AC
  Filled 2018-04-30: qty 1

## 2018-04-30 MED ORDER — ACETAMINOPHEN 325 MG RE SUPP: 325.0000 mg | RECTAL | Status: DC | PRN

## 2018-04-30 MED ORDER — FUROSEMIDE 40 MG PO TABS
40.0000 mg | ORAL_TABLET | Freq: Every day | ORAL | Status: DC
  Administered 2018-04-30 – 2018-05-01 (×2): 40 mg via ORAL
  Filled 2018-04-30 (×3): qty 1

## 2018-04-30 MED ORDER — ROCURONIUM BROMIDE 10 MG/ML (PF) SYRINGE
PREFILLED_SYRINGE | INTRAVENOUS | Status: DC | PRN
  Administered 2018-04-30: 50 mg via INTRAVENOUS

## 2018-04-30 MED ORDER — SODIUM CHLORIDE 0.9 % IV SOLN: 500.0000 mL | Freq: Once | INTRAVENOUS | Status: DC | PRN

## 2018-04-30 MED ORDER — ALBUTEROL SULFATE (2.5 MG/3ML) 0.083% IN NEBU: 3.0000 mL | INHALATION_SOLUTION | Freq: Four times a day (QID) | RESPIRATORY_TRACT | Status: DC | PRN

## 2018-04-30 MED ORDER — TIOTROPIUM BROMIDE MONOHYDRATE 18 MCG IN CAPS: 18.0000 ug | ORAL_CAPSULE | Freq: Every day | RESPIRATORY_TRACT | Status: DC | PRN

## 2018-04-30 MED ORDER — MUPIROCIN 2 % EX OINT
1.0000 "application " | TOPICAL_OINTMENT | Freq: Once | CUTANEOUS | Status: AC
  Administered 2018-04-30: 1 via TOPICAL

## 2018-04-30 MED ORDER — FENTANYL CITRATE (PF) 100 MCG/2ML IJ SOLN: 25.0000 ug | INTRAMUSCULAR | Status: DC | PRN

## 2018-04-30 MED ORDER — ATORVASTATIN CALCIUM 80 MG PO TABS: 80.0000 mg | ORAL_TABLET | Freq: Every day | ORAL | Status: DC

## 2018-04-30 MED ORDER — SODIUM CHLORIDE 0.9 % IV SOLN
INTRAVENOUS | Status: AC
  Filled 2018-04-30: qty 1.2

## 2018-04-30 SURGICAL SUPPLY — 70 items
BAG BANDED W/RUBBER/TAPE 36X54 (MISCELLANEOUS) ×3 IMPLANT
BALLN STERLING RX 5X30X80 (BALLOONS) ×3
BALLN STERLING RX 6X20X80 (BALLOONS) ×3
BALLOON STERLING RX 5X30X80 (BALLOONS) ×1 IMPLANT
BALLOON STERLING RX 6X20X80 (BALLOONS) ×1 IMPLANT
CANISTER SUCT 3000ML PPV (MISCELLANEOUS) ×3 IMPLANT
CATH ROBINSON RED A/P 18FR (CATHETERS) IMPLANT
CLIP VESOCCLUDE MED 24/CT (CLIP) ×3 IMPLANT
CLIP VESOCCLUDE SM WIDE 24/CT (CLIP) ×3 IMPLANT
COVER DOME SNAP 22 D (MISCELLANEOUS) ×3 IMPLANT
COVER PROBE W GEL 5X96 (DRAPES) ×3 IMPLANT
CRADLE DONUT ADULT HEAD (MISCELLANEOUS) ×3 IMPLANT
DERMABOND ADVANCED (GAUZE/BANDAGES/DRESSINGS) ×4
DERMABOND ADVANCED .7 DNX12 (GAUZE/BANDAGES/DRESSINGS) ×2 IMPLANT
DRAIN CHANNEL 15F RND FF W/TCR (WOUND CARE) IMPLANT
DRAPE INCISE IOBAN 85X60 (DRAPES) ×6 IMPLANT
DRAPE UNIVERSAL PACK (DRAPES) ×3 IMPLANT
ELECT REM PT RETURN 9FT ADLT (ELECTROSURGICAL) ×3
ELECTRODE REM PT RTRN 9FT ADLT (ELECTROSURGICAL) ×1 IMPLANT
EVACUATOR SILICONE 100CC (DRAIN) IMPLANT
GLOVE BIO SURGEON STRL SZ7.5 (GLOVE) ×3 IMPLANT
GOWN STRL REUS W/ TWL LRG LVL3 (GOWN DISPOSABLE) ×2 IMPLANT
GOWN STRL REUS W/ TWL XL LVL3 (GOWN DISPOSABLE) ×1 IMPLANT
GOWN STRL REUS W/TWL LRG LVL3 (GOWN DISPOSABLE) ×4
GOWN STRL REUS W/TWL XL LVL3 (GOWN DISPOSABLE) ×2
GUIDEWIRE ENROUTE 0.014 (WIRE) ×3 IMPLANT
HEMOSTAT SNOW SURGICEL 2X4 (HEMOSTASIS) IMPLANT
INSERT FOGARTY SM (MISCELLANEOUS) IMPLANT
INTRODUCER KIT GALT 7CM (INTRODUCER) ×2
IV ADAPTER SYR DOUBLE MALE LL (MISCELLANEOUS) IMPLANT
KIT BASIN OR (CUSTOM PROCEDURE TRAY) ×3 IMPLANT
KIT ENCORE 26 ADVANTAGE (KITS) ×3 IMPLANT
KIT INTRODUCER GALT 7 (INTRODUCER) ×1 IMPLANT
KIT TURNOVER KIT B (KITS) ×3 IMPLANT
NEEDLE HYPO 25GX1X1/2 BEV (NEEDLE) IMPLANT
NEEDLE PERC 18GX7CM (NEEDLE) ×6 IMPLANT
NEEDLE SPNL 20GX3.5 QUINCKE YW (NEEDLE) IMPLANT
PACK CAROTID (CUSTOM PROCEDURE TRAY) ×3 IMPLANT
PAD ARMBOARD 7.5X6 YLW CONV (MISCELLANEOUS) ×6 IMPLANT
PROTECTION STATION PRESSURIZED (MISCELLANEOUS) ×3
SET MICROPUNCTURE 5F STIFF (MISCELLANEOUS) ×3 IMPLANT
SHEATH AVANTI 11CM 5FR (SHEATH) IMPLANT
SHUNT CAROTID BYPASS 10 (VASCULAR PRODUCTS) IMPLANT
SHUNT CAROTID BYPASS 12FRX15.5 (VASCULAR PRODUCTS) IMPLANT
STATION PROTECTION PRESSURIZED (MISCELLANEOUS) ×1 IMPLANT
STENT TRANSCAROTID SYS 10X40 (Permanent Stent) ×3 IMPLANT
STOPCOCK 4 WAY LG BORE MALE ST (IV SETS) ×3 IMPLANT
SUT ETHILON 3 0 PS 1 (SUTURE) IMPLANT
SUT PROLENE 5 0 C 1 24 (SUTURE) ×3 IMPLANT
SUT PROLENE 6 0 BV (SUTURE) ×3 IMPLANT
SUT PROLENE 7 0 BV 1 (SUTURE) IMPLANT
SUT SILK 2 0 PERMA HAND 18 BK (SUTURE) ×3 IMPLANT
SUT SILK 2 0 SH CR/8 (SUTURE) IMPLANT
SUT SILK 3 0 (SUTURE)
SUT SILK 3-0 18XBRD TIE 12 (SUTURE) IMPLANT
SUT VIC AB 3-0 SH 27 (SUTURE) ×2
SUT VIC AB 3-0 SH 27X BRD (SUTURE) ×1 IMPLANT
SUT VICRYL 4-0 PS2 18IN ABS (SUTURE) ×3 IMPLANT
SYR 10ML LL (SYRINGE) ×9 IMPLANT
SYR 20CC LL (SYRINGE) ×3 IMPLANT
SYR 5ML LL (SYRINGE) ×3 IMPLANT
SYR CONTROL 10ML LL (SYRINGE) IMPLANT
SYSTEM TRANSCAROTID NEUROPRTCT (MISCELLANEOUS) ×1 IMPLANT
TOWEL GREEN STERILE (TOWEL DISPOSABLE) ×3 IMPLANT
TRANSCAROTID NEUROPROTECT SYS (MISCELLANEOUS) ×3
TUBING ART PRESS 48 MALE/FEM (TUBING) IMPLANT
TUBING EXTENTION W/L.L. (IV SETS) IMPLANT
WATER STERILE IRR 1000ML POUR (IV SOLUTION) ×3 IMPLANT
WIRE AMPLATZ SS-J .035X180CM (WIRE) IMPLANT
WIRE BENTSON .035X145CM (WIRE) ×6 IMPLANT

## 2018-04-30 NOTE — Transfer of Care (Signed)
Immediate Anesthesia Transfer of Care Note  Patient: Daniel Simon  Procedure(s) Performed: LEFT TRANSCAROTID ARTERY REVASCULARIZATION (Left )  Patient Location: PACU  Anesthesia Type:General  Level of Consciousness: awake, alert , oriented and patient cooperative  Airway & Oxygen Therapy: Patient Spontanous Breathing and Patient connected to nasal cannula oxygen  Post-op Assessment: Report given to RN, Post -op Vital signs reviewed and stable and Patient moving all extremities X 4  Post vital signs: Reviewed and stable  Last Vitals:  Vitals Value Taken Time  BP 144/116 04/30/2018 12:06 PM  Temp    Pulse 66 04/30/2018 12:08 PM  Resp 16 04/30/2018 12:10 PM  SpO2 93 % 04/30/2018 12:10 PM  Vitals shown include unvalidated device data.  Last Pain:  Vitals:   04/30/18 0818  TempSrc:   PainSc: 0-No pain      Patients Stated Pain Goal: 0 (11/55/20 8022)  Complications: No apparent anesthesia complications

## 2018-04-30 NOTE — H&P (Signed)
   History and Physical Update  The patient was interviewed and re-examined.  The patient's previous History and Physical has been reviewed and is unchanged from recent office visit. Plan for left tcar.   Daniel Simon C. Donzetta Matters, MD [facility] [place] Office: [phone] Pager: [phone]   04/30/2018, 9:35 AM

## 2018-04-30 NOTE — Anesthesia Postprocedure Evaluation (Signed)
Anesthesia Post Note  Patient: Daniel Simon  Procedure(s) Performed: LEFT TRANSCAROTID ARTERY REVASCULARIZATION (Left )     Patient location during evaluation: PACU Anesthesia Type: General Level of consciousness: awake and alert Pain management: pain level controlled Vital Signs Assessment: post-procedure vital signs reviewed and stable Respiratory status: spontaneous breathing, nonlabored ventilation and respiratory function stable Cardiovascular status: blood pressure returned to baseline and stable Postop Assessment: no apparent nausea or vomiting Anesthetic complications: no    Last Vitals:  Vitals:   04/30/18 1250 04/30/18 1305  BP: 127/61   Pulse: 77 76  Resp: 13 17  Temp:  36.4 C  SpO2: 95% 97%    Last Pain:  Vitals:   04/30/18 1305  TempSrc:   PainSc: 0-No pain                 Dylann Gallier,W. EDMOND

## 2018-04-30 NOTE — Op Note (Signed)
    Patient name: TLALOC TADDEI MRN: [id number] DOB: [birth year] Sex: male  04/30/2018 Pre-operative Diagnosis: Asymptomatic high-grade left internal carotid artery stenosis Post-operative diagnosis:  Same Surgeon:  Erlene Quan C. Donzetta Matters, MD Assistant: Marty Heck, MD Procedure Performed: Left trans-carotid artery revascularization with 10 x 40 en route stent  Indications: 83 year old male previously had bilateral high-grade internal carotid artery stenosis the right side was symptomatic.  He now has greater than 80% on the left with minimal calcification and is indicated for trans-carotid artery stenting.  Findings: There is a high-grade stenosis approximately 80% at the origin of the internal carotid artery.  After stenting this is resolved to less than 20%.  There was initially kinking of the stent proximally or where it had failed to open this was postdilated with a 6 mm balloon and at completion there was no residual issue proximally and the stent.   Procedure:  The patient was identified in the holding area and taken to the operating room where is placed supine on the operative table and general anesthesia was induced.  He was sterilely prepped and draped in the neck chest bilateral groins in the usual fashion, antibiotics were administered, a timeout was called.  We began with ultrasound-guided evaluation of left common carotid artery.  Concomitantly a right common femoral vein was identified under ultrasound cannulated with micropuncture needle followed by wire in the micropuncture sheath followed by a Bentson wire and the 8 French sheath was placed.  Longitudinal incision was made overlying the common carotid artery between the 2 heads of sternocleidomastoid we dissected divided down through the subcutaneous tissue split the muscles identify the vagus nerve protected this placed a vessel loop as well as umbilical tape around the common carotid artery.  A 5-0 Prolene figure-of-eight suture  was placed.  Patient was given 7000 units of heparin and ACT returned 280.  After this we then cannulated the common carotid artery with micropuncture needle followed by the wire and the sheath to 3 cm.  We performed angiography.  We then placed our stiff J-wire our in route sheath with tension pulled on the umbilical tape.  We then performed 2 views after hooking up to flow reversal.  This demonstrated the sheath in good position.  The 5 x 3 mm balloon was then brought to the table we are able to select the internal carotid artery and this was predilated.  A 10 x 40 stent was then deployed.  Two-view angiography completion demonstrated the stent had not fully opened in its most proximal extent.  This was postdilated with 6 mm balloon.  Completion 2 view imaging demonstrated resolution of the proximal issue.  There is less than 20% residual stenosis in the ICA.  The wire was removed the sheath was removed and 5-0 Prolene figure-of-eight suture was cinched down.  Sheath was removed in the right groin and pressure held for 10 minutes until hemostasis obtained.  50 mg of protamine was administered which he tolerated well.  We then closed the platysma with Vicryl followed by skin with Monocryl.  Dermabond was placed to level skin.  Patient was then allowed away from anesthesia, after a few minutes was moving all extremities and was transferred to pacu in stable condition.  EBL: 25cc    Alfie Alderfer C. Donzetta Matters, MD [facility] [place] Office: [phone] Pager: [phone]

## 2018-04-30 NOTE — Anesthesia Procedure Notes (Addendum)
Procedure Name: Intubation Date/Time: 04/30/2018 10:25 AM Performed by: Carney Living, CRNA Pre-anesthesia Checklist: Patient identified, Emergency Drugs available, Suction available and Patient being monitored Patient Re-evaluated:Patient Re-evaluated prior to induction Oxygen Delivery Method: Circle System Utilized Preoxygenation: Pre-oxygenation with 100% oxygen Induction Type: IV induction Ventilation: Mask ventilation without difficulty and Oral airway inserted - appropriate to patient size Laryngoscope Size: Sabra Heck and 2 Grade View: Grade I Tube type: Oral Tube size: 7.5 mm Number of attempts: 1 Airway Equipment and Method: Stylet and Oral airway Placement Confirmation: ETT inserted through vocal cords under direct vision,  positive ETCO2 and breath sounds checked- equal and bilateral Secured at: 21 cm Tube secured with: Tape Dental Injury: Teeth and Oropharynx as per pre-operative assessment  Comments: Performed by Gillian Scarce

## 2018-04-30 NOTE — Anesthesia Procedure Notes (Signed)
Arterial Line Insertion Start/End9/26/2019 9:00 AM, 04/30/2018 9:09 AM Performed by: Josephine Igo, CRNA, CRNA  Patient location: Pre-op. Preanesthetic checklist: patient identified, IV checked, risks and benefits discussed and surgical consent Lidocaine 1% used for infiltration Right, radial was placed Catheter size: 20 G Maximum sterile barriers used  and Seldinger technique used Allen's test indicative of satisfactory collateral circulation Attempts: 1 Procedure performed without using ultrasound guided technique. Following insertion, dressing applied and Biopatch. Post procedure assessment: normal

## 2018-04-30 NOTE — Anesthesia Preprocedure Evaluation (Addendum)
Anesthesia Evaluation  Patient identified by MRN, date of birth, ID band Patient awake    Reviewed: Allergy & Precautions, H&P , NPO status , Patient's Chart, lab work & pertinent test results, reviewed documented beta blocker date and time   Airway Mallampati: II  TM Distance: >3 FB Neck ROM: Full    Dental no notable dental hx. (+) Edentulous Upper, Edentulous Lower, Dental Advisory Given   Pulmonary COPD,  COPD inhaler, Current Smoker,    Pulmonary exam normal breath sounds clear to auscultation       Cardiovascular hypertension, Pt. on medications and Pt. on home beta blockers + CAD, + Past MI, + Cardiac Stents, + Peripheral Vascular Disease and + DOE   Rhythm:Regular Rate:Normal     Neuro/Psych CVA, No Residual Symptoms negative psych ROS   GI/Hepatic negative GI ROS, Neg liver ROS,   Endo/Other  negative endocrine ROS  Renal/GU negative Renal ROS  negative genitourinary   Musculoskeletal   Abdominal   Peds  Hematology negative hematology ROS (+)   Anesthesia Other Findings   Reproductive/Obstetrics negative OB ROS                            Anesthesia Physical Anesthesia Plan  ASA: III  Anesthesia Plan: General   Post-op Pain Management:    Induction: Intravenous  PONV Risk Score and Plan: 2 and Ondansetron and Dexamethasone  Airway Management Planned: Oral ETT  Additional Equipment: Arterial line  Intra-op Plan:   Post-operative Plan: Extubation in OR  Informed Consent: I have reviewed the patients History and Physical, chart, labs and discussed the procedure including the risks, benefits and alternatives for the proposed anesthesia with the patient or authorized representative who has indicated his/her understanding and acceptance.   Dental advisory given  Plan Discussed with: CRNA, Anesthesiologist and Surgeon  Anesthesia Plan Comments:        Anesthesia  Quick Evaluation

## 2018-05-01 ENCOUNTER — Telehealth: Payer: Self-pay | Admitting: Vascular Surgery

## 2018-05-01 LAB — BASIC METABOLIC PANEL
ANION GAP: 10 (ref 5–15)
BUN: 19 mg/dL (ref 8–23)
CO2: 23 mmol/L (ref 22–32)
Calcium: 8.8 mg/dL — ABNORMAL LOW (ref 8.9–10.3)
Chloride: 107 mmol/L (ref 98–111)
Creatinine, Ser: 1.25 mg/dL — ABNORMAL HIGH (ref 0.61–1.24)
GFR calc Af Amer: >60 mL/min (ref >60)
GFR, EST NON AFRICAN AMERICAN: 52 mL/min — AB (ref >60)
GLUCOSE: 162 mg/dL — AB (ref 70–99)
POTASSIUM: 4.3 mmol/L (ref 3.5–5.1)
Sodium: 140 mmol/L (ref 135–145)

## 2018-05-01 LAB — CBC
HEMATOCRIT: 35.4 % — AB (ref 39.0–52.0)
Hemoglobin: 11.7 g/dL — ABNORMAL LOW (ref 13.0–17.0)
MCH: 31.2 pg (ref 26.0–34.0)
MCHC: 33.1 g/dL (ref 30.0–36.0)
MCV: 94.4 fL (ref 78.0–100.0)
PLATELETS: 111 10*3/uL — AB (ref 150–400)
RBC: 3.75 MIL/uL — AB (ref 4.22–5.81)
RDW: 15.7 % — ABNORMAL HIGH (ref 11.5–15.5)
WBC: 8.1 10*3/uL (ref 4.0–10.5)

## 2018-05-01 MED ORDER — OXYCODONE-ACETAMINOPHEN 5-325 MG PO TABS: 1.0000 | ORAL_TABLET | Freq: Four times a day (QID) | ORAL | 0 refills | Status: AC | PRN

## 2018-05-01 NOTE — Discharge Summary (Signed)
Discharge Summary     COHL BEHRENS [birth year] 83 y.o. male  [id number]  Admission Date: 04/30/2018  Discharge Date: 05/01/18  Physician: Thomes Lolling*  Admission Diagnosis: left carotid stenosis  Discharge Day services:    see progress note 05/01/18 Physical Exam: Vitals:   04/30/18 2300 05/01/18 0411  BP: (!) 116/47 (!) 131/52  Pulse: 69 66  Resp: 18 (!) 21  Temp: 98.5 F (36.9 C) 98.2 F (36.8 C)  SpO2: 92% 92%    Hospital Course:  The patient was admitted to the hospital and taken to the operating room on 04/30/2018 and underwent left trans-carotid artery revascularization with stenting by Dr. Donzetta Matters.  The pt tolerated the procedure well and was transported to the PACU in good condition.   By POD 1, the pt neuro status remained at baseline  The remainder of the hospital course consisted of increasing mobilization and increasing intake of solids without difficulty.  He will be prescribed 1-2 days of narcotic pain medication.  He will be discharged home in stable condition and will follow up in 3-4 weeks with carotid duplex.   Recent Labs    04/30/18 0831 05/01/18 0500  NA 143 140  K 4.2 4.3  CL 109 107  CO2 21* 23  GLUCOSE 101* 162*  BUN 19 19  CALCIUM 9.4 8.8*   Recent Labs    04/30/18 0831 05/01/18 0500  WBC 6.8 8.1  HGB 15.1 11.7*  HCT 46.7 35.4*  PLT 140* 111*   Recent Labs    04/30/18 0831  INR 1.10       Discharge Diagnosis:  left carotid stenosis  Secondary Diagnosis: Patient Active Problem List   Diagnosis Date Noted  . Carotid artery stenosis 04/30/2018  . Carotid artery stenosis, symptomatic, right 03/12/2018  . Carotid disease, bilateral ([place]) 02/18/2018  . CAD (coronary artery disease), native coronary artery 02/10/2018  . Abnormal stress test   . DOE (dyspnea on exertion)   . Chronic combined systolic and diastolic heart failure ([place])   . Ascending aortic aneurysm ([place]) 12/26/2017  . Carotid stenosis,  bilateral 12/26/2017  . Lymphadenopathy 12/26/2017  . Stroke (cerebrum) ([place]) 12/24/2017  . Tobacco abuse 12/24/2017  . Left sided numbness 12/24/2017  . HTN (hypertension) 12/24/2017  . Acute CHF (congestive heart failure) ([place]) 12/24/2017   Past Medical History:  Diagnosis Date  . CAD (coronary artery disease)    a. 02/2018: 3-vessel CAD by cath --> not felt to be a CABG candidate, therefore s/p DES to proximal LAD.   [facility])    prostate  . Carotid stenosis, right   . COPD (chronic obstructive pulmonary disease) ([place])   . History of kidney stones   . Hyperlipidemia LDL goal <70   . Hypertension   . Myocardial infarction ([place])   . Stroke [facility])     Allergies as of 05/01/2018   No Known Allergies     Medication List    TAKE these medications   albuterol 108 (90 Base) MCG/ACT inhaler Commonly known as:  PROVENTIL HFA;VENTOLIN HFA Inhale 2 puffs into the lungs every 6 (six) hours as needed for wheezing or shortness of breath.   aspirin EC 81 MG tablet Take 81 mg by mouth daily.   atorvastatin 80 MG tablet Commonly known as:  LIPITOR Take 1 tablet (80 mg total) by mouth daily at 6 PM.   carvedilol 6.25 MG tablet Commonly known as:  COREG TAKE 1 TABLET (6.25 MG TOTAL) BY MOUTH 2 (  TWO) TIMES DAILY WITH A MEAL.   clopidogrel 75 MG tablet Commonly known as:  PLAVIX Take 1 tablet (75 mg total) by mouth daily with breakfast.   furosemide 40 MG tablet Commonly known as:  LASIX Take 1 tablet (40 mg total) by mouth daily.   isosorbide mononitrate 30 MG 24 hr tablet Commonly known as:  IMDUR Take 1 tablet (30 mg total) by mouth daily.   nitroGLYCERIN 0.4 MG SL tablet Commonly known as:  NITROSTAT Place 1 tablet (0.4 mg total) under the tongue every 5 (five) minutes as needed for chest pain.   oxyCODONE-acetaminophen 5-325 MG tablet Commonly known as:  PERCOCET/ROXICET Take 1 tablet by mouth every 6 (six) hours as needed for moderate pain.   Potassium Chloride  ER 20 MEQ Tbcr TAKE 1 TABLET BY MOUTH ONCE DAILY What changed:  how much to take   SPIRIVA HANDIHALER 18 MCG inhalation capsule Generic drug:  tiotropium INHALE 1 CAPSULE VIA HANDIHALER ONCE DAILY AT THE SAME TIME EVERY DAY What changed:  See the new instructions.        Discharge Instructions:   [facility] Discharge Instructions Carotid Endarterectomy (CEA)  Please refer to the following instructions for your post-procedure care. Your surgeon or physician assistant will discuss any changes with you.  Activity  You are encouraged to walk as much as you can. You can slowly return to normal activities but must avoid strenuous activity and heavy lifting until your doctor tell you it's OK. Avoid activities such as vacuuming or swinging a golf club. You can drive after one week if you are comfortable and you are no longer taking prescription pain medications. It is normal to feel tired for serval weeks after your surgery. It is also normal to have difficulty with sleep habits, eating, and bowel movements after surgery. These will go away with time.  Bathing/Showering  You may shower after you come home. Do not soak in a bathtub, hot tub, or swim until the incision heals completely.  Incision Care  Shower every day. Clean your incision with mild soap and water. Pat the area dry with a clean towel. You do not need a bandage unless otherwise instructed. Do not apply any ointments or creams to your incision. You may have skin glue on your incision. Do not peel it off. It will come off on its own in about one week. Your incision may feel thickened and raised for several weeks after your surgery. This is normal and the skin will soften over time. For Men Only: It's OK to shave around the incision but do not shave the incision itself for 2 weeks. It is common to have numbness under your chin that could last for several months.  Diet  Resume your normal diet.  There are no special food restrictions following this procedure. A low fat/low cholesterol diet is recommended for all patients with vascular disease. In order to heal from your surgery, it is CRITICAL to get adequate nutrition. Your body requires vitamins, minerals, and protein. Vegetables are the best source of vitamins and minerals. Vegetables also provide the perfect balance of protein. Processed food has little nutritional value, so try to avoid this.  Medications  Resume taking all of your medications unless your doctor or physician assistant tells you not to.  If your incision is causing pain, you may take over-the- counter pain relievers such as acetaminophen (Tylenol). If you were prescribed a stronger pain medication, please be aware these  medications can cause nausea and constipation.  Prevent nausea by taking the medication with a snack or meal. Avoid constipation by drinking plenty of fluids and eating foods with a high amount of fiber, such as fruits, vegetables, and grains. Do not take Tylenol if you are taking prescription pain medications.  Follow Up  Our office will schedule a follow up appointment 2-3 weeks following discharge.  Please call us immediately for any of the following conditions  Increased pain, redness, drainage (pus) from your incision site. Fever of 101 degrees or higher. If you should develop stroke (slurred speech, difficulty swallowing, weakness on one side of your body, loss of vision) you should call 911 and go to the nearest emergency room.  Reduce your risk of vascular disease:  Stop smoking. If you would like help call [facility] at 1-800-QUIT-NOW (702)050-0195) or [place] at [phone]. Manage your cholesterol Maintain a desired weight Control your diabetes Keep your blood pressure down  If you have any questions, please call the office at [phone].  Disposition: home  Patient's condition: is Good  Follow up: 1. Dr. Donzetta Matters in 2  weeks.   Dagoberto Ligas, PA-C [facility] [phone]   --- For [facility] Inpatient Registry use ---   Modified Rankin score at D/C (0-6): 0  IV medication needed for:  1. Hypertension: No 2. Hypotension: No  Post-op Complications: No  1. Post-op CVA or TIA: No  If yes: Event classification (right eye, left eye, right cortical, left cortical, verterobasilar, other):   If yes: Timing of event (intra-op, <6 hrs post-op, >=6 hrs post-op, unknown):   2. CN injury: No  If yes: CN  injuried   3. Myocardial infarction: No  If yes: Dx by (EKG or clinical, Troponin):   4.  CHF: No  5.  Dysrhythmia (new): No  6. Wound infection: No  7. Reperfusion symptoms: No  8. Return to OR: No  If yes: return to OR for (bleeding, neurologic, other CEA incision, other):   Discharge medications: Statin use:  Yes ASA use:  Yes   Beta blocker use:  Yes ACE-Inhibitor use:  No  ARB use:  No CCB use: No P2Y12 Antagonist use: Yes, [x ] Plavix, [ ]  Plasugrel, [ ]  Ticlopinine, [ ]  Ticagrelor, [ ]  Other, [ ]  No for medical reason, [ ]  Non-compliant, [ ]  Not-indicated Anti-coagulant use:  No, [ ]  Warfarin, [ ]  Rivaroxaban, [ ]  Dabigatran,

## 2018-05-01 NOTE — Care Management Note (Signed)
Case Management Note Marvetta Gibbons RN, BSN Unit 4E- RN Care Coordinator  [phone]  Patient Details  Name: Daniel Simon MRN: [id number] Date of Birth: 11-21-1934  Subjective/Objective:   Pt admitted s/p TCAR- left                 Action/Plan: PTA Pt lived at home with wife, notified by Jonelle Sidle with [facility] that pt has pre-op referral for [facility] services. Spoke with pt and wife at bedside- they state that they have already had contact with [facility]- notified Tiffany with [facility] of pt's transition to home today, no other CM needs noted.   Expected Discharge Date:  05/01/18               Expected Discharge Plan:  [place]  In-House Referral:  NA  Discharge planning Services  CM Consult  Post Acute Care Choice:  Home Health Choice offered to:  NA  DME Arranged:    DME Agency:     HH Arranged:    HH Agency:  [facility]  Status of Service:  Completed, signed off  If discussed at [place] of Stay Meetings, dates discussed:    Discharge Disposition: home/home health   Additional Comments:  Dawayne Patricia, RN 05/01/2018, 10:05 AM

## 2018-05-01 NOTE — Telephone Encounter (Signed)
sch appt spk to pt mld ltr 05/29/18 8am carotid 9am p/o MD

## 2018-05-01 NOTE — Discharge Instructions (Signed)
   [facility] [place]  Discharge Instructions   Carotid Endarterectomy (CEA)  Please refer to the following instructions for your post-procedure care. Your surgeon or physician assistant will discuss any changes with you.  Activity  You are encouraged to walk as much as you can. You can slowly return to normal activities but must avoid strenuous activity and heavy lifting until your doctor tell you it's OK. Avoid activities such as vacuuming or swinging a golf club. You can drive after one week if you are comfortable and you are no longer taking prescription pain medications. It is normal to feel tired for serval weeks after your surgery. It is also normal to have difficulty with sleep habits, eating, and bowel movements after surgery. These will go away with time.  Bathing/Showering  You may shower after you come home. Do not soak in a bathtub, hot tub, or swim until the incision heals completely.  Incision Care  Shower every day. Clean your incision with mild soap and water. Pat the area dry with a clean towel. You do not need a bandage unless otherwise instructed. Do not apply any ointments or creams to your incision. You may have skin glue on your incision. Do not peel it off. It will come off on its own in about one week. Your incision may feel thickened and raised for several weeks after your surgery. This is normal and the skin will soften over time. For Men Only: It's OK to shave around the incision but do not shave the incision itself for 2 weeks. It is common to have numbness under your chin that could last for several months.  Diet  Resume your normal diet. There are no special food restrictions following this procedure. A low fat/low cholesterol diet is recommended for all patients with vascular disease. In order to heal from your surgery, it is CRITICAL to get adequate nutrition. Your body requires vitamins, minerals, and protein. Vegetables are the best  source of vitamins and minerals. Vegetables also provide the perfect balance of protein. Processed food has little nutritional value, so try to avoid this.        Medications  Resume taking all of your medications unless your doctor or physician assistant tells you not to. If your incision is causing pain, you may take over-the- counter pain relievers such as acetaminophen (Tylenol). If you were prescribed a stronger pain medication, please be aware these medications can cause nausea and constipation. Prevent nausea by taking the medication with a snack or meal. Avoid constipation by drinking plenty of fluids and eating foods with a high amount of fiber, such as fruits, vegetables, and grains. Do not take Tylenol if you are taking prescription pain medications.  Follow Up  Our office will schedule a follow up appointment 2-3 weeks following discharge.  Please call us immediately for any of the following conditions  Increased pain, redness, drainage (pus) from your incision site. Fever of 101 degrees or higher. If you should develop stroke (slurred speech, difficulty swallowing, weakness on one side of your body, loss of vision) you should call 911 and go to the nearest emergency room.  Reduce your risk of vascular disease:  Stop smoking. If you would like help call [facility] at 1-800-QUIT-NOW (1-800-784-8669) or [place] at 336-586-4000. Manage your cholesterol Maintain a desired weight Control your diabetes Keep your blood pressure down  If you have any questions, please call the office at [phone].   

## 2018-05-01 NOTE — Progress Notes (Signed)
  Progress Note    05/01/2018 8:17 AM 1 Day Post-Op  Subjective: No complaints overnight  Vitals:   04/30/18 2300 05/01/18 0411  BP: (!) 116/47 (!) 131/52  Pulse: 69 66  Resp: 18 (!) 21  Temp: 98.5 F (36.9 C) 98.2 F (36.8 C)  SpO2: 92% 92%    Physical Exam: Awake alert oriented Nonlabored respirations Left neck incision clean dry and intact with mild hematoma Moving all extremities without limitation  CBC    Component Value Date/Time   WBC 8.1 05/01/2018 0500   RBC 3.75 (L) 05/01/2018 0500   HGB 11.7 (L) 05/01/2018 0500   HCT 35.4 (L) 05/01/2018 0500   PLT 111 (L) 05/01/2018 0500   MCV 94.4 05/01/2018 0500   MCH 31.2 05/01/2018 0500   MCHC 33.1 05/01/2018 0500   RDW 15.7 (H) 05/01/2018 0500   LYMPHSABS 3.0 02/02/2018 1631   MONOABS 0.5 02/02/2018 1631   EOSABS 0.4 02/02/2018 1631   BASOSABS 0.0 02/02/2018 1631    BMET    Component Value Date/Time   NA 140 05/01/2018 0500   K 4.3 05/01/2018 0500   CL 107 05/01/2018 0500   CO2 23 05/01/2018 0500   GLUCOSE 162 (H) 05/01/2018 0500   BUN 19 05/01/2018 0500   CREATININE 1.25 (H) 05/01/2018 0500   CALCIUM 8.8 (L) 05/01/2018 0500   GFRNONAA 52 (L) 05/01/2018 0500   GFRAA >60 05/01/2018 0500    INR    Component Value Date/Time   INR 1.10 04/30/2018 0831     Intake/Output Summary (Last 24 hours) at 05/01/2018 0817 Last data filed at 05/01/2018 0551 Gross per 24 hour  Intake 1750 ml  Output 100 ml  Net 1650 ml     Assessment:  83 y.o. male is s/p left trans-carotid artery revascularization with stenting and has previously had the right side done for symptomatic disease.  Plan: Okay for discharge today. Follow-up in 3 4 weeks with wound check and carotid duplex.   Wendall Isabell C. Donzetta Matters, MD [facility] [place] Office: [phone] Pager: [phone]  05/01/2018 8:17 AM

## 2018-05-01 NOTE — Progress Notes (Signed)
Discharge instructions reviewed with patient, wife and son and Rx given for pain meds. All questions answered and he agrees to all follow up recommendations. He is discharged via wheelchair in stable condition to family vehicle with all belongings.

## 2018-05-01 NOTE — Plan of Care (Signed)
  Problem: Clinical Measurements: Goal: Postoperative complications will be avoided or minimized Outcome: Adequate for Discharge   Problem: Skin Integrity: Goal: Demonstration of wound healing without infection will improve Outcome: Adequate for Discharge   Problem: Education: Goal: Knowledge of General Education information will improve Description Including pain rating scale, medication(s)/side effects and non-pharmacologic comfort measures Outcome: Adequate for Discharge   Problem: Health Behavior/Discharge Planning: Goal: Ability to manage health-related needs will improve Outcome: Adequate for Discharge   Problem: Clinical Measurements: Goal: Will remain free from infection Outcome: Adequate for Discharge Goal: Cardiovascular complication will be avoided Outcome: Adequate for Discharge   Problem: Activity: Goal: Risk for activity intolerance will decrease Outcome: Adequate for Discharge   Problem: Nutrition: Goal: Adequate nutrition will be maintained Outcome: Adequate for Discharge   Problem: Elimination: Goal: Will not experience complications related to bowel motility Outcome: Adequate for Discharge Goal: Will not experience complications related to urinary retention Outcome: Adequate for Discharge   Problem: Pain Managment: Goal: General experience of comfort will improve Outcome: Adequate for Discharge   Problem: Safety: Goal: Ability to remain free from injury will improve Outcome: Adequate for Discharge   Problem: Clinical Measurements: Goal: Postoperative complications will be avoided or minimized Outcome: Adequate for Discharge   Problem: Skin Integrity: Goal: Demonstration of wound healing without infection will improve Outcome: Adequate for Discharge   Problem: Education: Goal: Knowledge of General Education information will improve Description Including pain rating scale, medication(s)/side effects and non-pharmacologic comfort  measures Outcome: Adequate for Discharge   Problem: Health Behavior/Discharge Planning: Goal: Ability to manage health-related needs will improve Outcome: Adequate for Discharge   Problem: Clinical Measurements: Goal: Will remain free from infection Outcome: Adequate for Discharge Goal: Cardiovascular complication will be avoided Outcome: Adequate for Discharge   Problem: Activity: Goal: Risk for activity intolerance will decrease Outcome: Adequate for Discharge   Problem: Nutrition: Goal: Adequate nutrition will be maintained Outcome: Adequate for Discharge   Problem: Elimination: Goal: Will not experience complications related to bowel motility Outcome: Adequate for Discharge Goal: Will not experience complications related to urinary retention Outcome: Adequate for Discharge   Problem: Pain Managment: Goal: General experience of comfort will improve Outcome: Adequate for Discharge   Problem: Safety: Goal: Ability to remain free from injury will improve Outcome: Adequate for Discharge

## 2018-05-04 ENCOUNTER — Other Ambulatory Visit: Payer: Self-pay

## 2018-05-04 ENCOUNTER — Encounter (HOSPITAL_COMMUNITY): Payer: Self-pay | Admitting: Vascular Surgery

## 2018-05-04 DIAGNOSIS — I6523 Occlusion and stenosis of bilateral carotid arteries: Secondary | ICD-10-CM

## 2018-05-29 ENCOUNTER — Ambulatory Visit (INDEPENDENT_AMBULATORY_CARE_PROVIDER_SITE_OTHER): Payer: Self-pay | Admitting: Vascular Surgery

## 2018-05-29 ENCOUNTER — Ambulatory Visit (HOSPITAL_COMMUNITY)
Admission: RE | Admit: 2018-05-29 | Discharge: 2018-05-29 | Disposition: A | Discharge status: Home / Self Care | Payer: Medicare Other | Source: Ambulatory Visit | Attending: Vascular Surgery | Admitting: Vascular Surgery

## 2018-05-29 ENCOUNTER — Encounter: Payer: Self-pay | Admitting: Vascular Surgery

## 2018-05-29 VITALS — BP 183/82 | HR 59 | Temp 97.2°F | Resp 16 | Ht 67.0 in | Wt 134.0 lb

## 2018-05-29 DIAGNOSIS — I6523 Occlusion and stenosis of bilateral carotid arteries: Secondary | ICD-10-CM | POA: Diagnosis present

## 2018-05-29 NOTE — Progress Notes (Signed)
    Subjective:     Patient ID: JAYVEN NAILL, male   DOB: January 28, 1935, 83 y.o.   MRN: [id number]  HPI 83 year old male follows up from bilateral trans-carotid artery stenting most recently his left.  He had previously had evidence of bilateral strokes but presented with a stroke on the right side that is why that was performed first.  Duplex was performed in the office today.  He has no new complaints other than some balance issues.   Review of Systems Intermittent issues with balance    Objective:   Physical Exam Awake alert oriented Nonlabored respirations Bilateral neck incisions are well-healed Moving all extremities without limitation and grip strength is 5 out of 5 bilaterally  I have independently interpreted his carotid duplex which demonstrates no stenosis    Assessment:     83 year old male status post bilateral trans-carotid artery stenting for symptomatic disease    Plan:     Plan will be to follow-up in 6 months with repeat carotid duplex.  He will continue aspirin Plavix.  I would like him to stay on Plavix uninterrupted until at least Christmas which would be a total of 3 months.     Brandon C. Donzetta Matters, MD [facility] [place] Office: [phone] Pager: [phone]

## 2018-06-03 ENCOUNTER — Ambulatory Visit: Payer: Medicare Other | Admitting: Orthopaedic Surgery

## 2018-06-19 ENCOUNTER — Encounter: Payer: Self-pay | Admitting: Cardiology

## 2018-06-19 ENCOUNTER — Ambulatory Visit (INDEPENDENT_AMBULATORY_CARE_PROVIDER_SITE_OTHER): Payer: Medicare Other | Admitting: Cardiology

## 2018-06-19 VITALS — BP 148/74 | HR 84 | Ht 67.0 in | Wt 135.0 lb

## 2018-06-19 DIAGNOSIS — E782 Mixed hyperlipidemia: Secondary | ICD-10-CM

## 2018-06-19 DIAGNOSIS — I2510 Atherosclerotic heart disease of native coronary artery without angina pectoris: Secondary | ICD-10-CM

## 2018-06-19 DIAGNOSIS — I5022 Chronic systolic (congestive) heart failure: Secondary | ICD-10-CM | POA: Diagnosis not present

## 2018-06-19 DIAGNOSIS — I639 Cerebral infarction, unspecified: Secondary | ICD-10-CM | POA: Diagnosis not present

## 2018-06-19 MED ORDER — LISINOPRIL 5 MG PO TABS: 5.0000 mg | ORAL_TABLET | Freq: Every day | ORAL | 3 refills | Status: AC

## 2018-06-19 NOTE — Progress Notes (Signed)
Clinical Summary Mr. Deoliveira is a 83 y.o.male    1. CAD - 01/2018 nuclear stres with periapical infarct with mild peri-infarct ischemia - 02/2018 cath severe 3 vessel disease as reported below. Turned down for CABG for CT surgery - 02/2018 DES to LAD, the distal LAD lesion could not be crossed with guidewire.   - no recent chest pain. No SOB/DOE - compliant with meds. Lisinopril stopped due to low bp's during recent admission  2. Chronic systolic HF - echo LVEF 10-25%, grade I diastolic dysfunction.  - had some volume overload during recent 12/2017 admission.   - no recent edema, no SOB or DOE   2. CVA - admit 12/2017 with CVA. Bilateral infarcts on MRI suggesting embolic event.  - was to have a 30 day event monitor - neurology has recommended DAPT. Vascuarl recommended anticoagulation due to CTA showing fatty plaque of aorta with possible thomrbus  3. Hyperlipidemia - compliatn with statin - upcoming labs with pcp  4. Carotid stenosis - followed by vascualr. CTA with right 50% ICA, left 70% ICA.  - carotid US with LICA >85%, RICA 27-78%  03/2018 right TCAR. 04/2018 left TCAR - recs for at least plavix until 07/2018  5. Aortic aneurysm - noted on CT 4.1 cm 12/2017 - from CT surgery inpatient consult do not recommend surveillance as he would not a candidate for surgical intervention  6. COPD - severe emphysema by CT - some cough at times. NO wheezing.    Past Medical History:  Diagnosis Date  . CAD (coronary artery disease)    a. 02/2018: 3-vessel CAD by cath --> not felt to be a CABG candidate, therefore s/p DES to proximal LAD.   [facility])    prostate  . Carotid stenosis, right   . COPD (chronic obstructive pulmonary disease) ([place])   . History of kidney stones   . Hyperlipidemia LDL goal <70   . Hypertension   . Myocardial infarction ([place])   . Stroke [facility])      No Known Allergies   Current Outpatient Medications  Medication Sig Dispense  Refill  . albuterol (PROVENTIL HFA;VENTOLIN HFA) 108 (90 Base) MCG/ACT inhaler Inhale 2 puffs into the lungs every 6 (six) hours as needed for wheezing or shortness of breath. 1 Inhaler 2  . aspirin EC 81 MG tablet Take 81 mg by mouth daily.    Marland Kitchen atorvastatin (LIPITOR) 80 MG tablet Take 1 tablet (80 mg total) by mouth daily at 6 PM. 90 tablet 3  . carvedilol (COREG) 6.25 MG tablet TAKE 1 TABLET (6.25 MG TOTAL) BY MOUTH 2 (TWO) TIMES DAILY WITH A MEAL. 180 tablet 3  . clopidogrel (PLAVIX) 75 MG tablet Take 1 tablet (75 mg total) by mouth daily with breakfast. 90 tablet 3  . furosemide (LASIX) 40 MG tablet Take 1 tablet (40 mg total) by mouth daily. 30 tablet 11  . isosorbide mononitrate (IMDUR) 30 MG 24 hr tablet Take 1 tablet (30 mg total) by mouth daily. 90 tablet 3  . nitroGLYCERIN (NITROSTAT) 0.4 MG SL tablet Place 1 tablet (0.4 mg total) under the tongue every 5 (five) minutes as needed for chest pain. 25 tablet 3  . oxyCODONE-acetaminophen (PERCOCET/ROXICET) 5-325 MG tablet Take 1 tablet by mouth every 6 (six) hours as needed for moderate pain. 12 tablet 0  . Potassium Chloride ER 20 MEQ TBCR TAKE 1 TABLET BY MOUTH ONCE DAILY (Patient taking differently: Take 20 mEq by mouth daily. ) 90  tablet 3  . SPIRIVA HANDIHALER 18 MCG inhalation capsule INHALE 1 CAPSULE VIA HANDIHALER ONCE DAILY AT THE SAME TIME EVERY DAY (Patient taking differently: Place 18 mcg into inhaler and inhale daily as needed (shortness of breath). ) 30 capsule 2   No current facility-administered medications for this visit.      Past Surgical History:  Procedure Laterality Date  . CARDIAC CATHETERIZATION    . CORONARY STENT INTERVENTION N/A 02/12/2018   Procedure: CORONARY STENT INTERVENTION;  Surgeon: Belva Crome, MD;  Location: [place] [facility];  Service: Cardiovascular;  Laterality: N/A;  . LEFT HEART CATH AND CORONARY ANGIOGRAPHY N/A 02/10/2018   Procedure: LEFT HEART CATH AND CORONARY ANGIOGRAPHY;  Surgeon: Belva Crome, MD;  Location: [place] [facility];  Service: Cardiovascular;  Laterality: N/A;  . PROSTATE BIOPSY    . TRANSCAROTID ARTERY REVASCULARIZATION (TCAR)  03/12/2018  . TRANSCAROTID ARTERY REVASCULARIZATION Right 03/12/2018   Procedure: TRANSCAROTID ARTERY REVASCULARIZATION RIGHT;  Surgeon: Conrad [place], MD;  Location: [place];  Service: Vascular;  Laterality: Right;  . TRANSCAROTID ARTERY REVASCULARIZATION Left 04/30/2018   Procedure: LEFT TRANSCAROTID ARTERY REVASCULARIZATION;  Surgeon: Waynetta Sandy, MD;  Location: [place];  Service: Vascular;  Laterality: Left;     No Known Allergies    Family History  Problem Relation Age of Onset  . Cancer Sister   . Cancer Sister   . Heart attack Brother   . Heart disease Brother      Social History Mr. Malatesta reports that he has been smoking cigarettes. He started smoking about 69 years ago. He has been smoking about 1.00 pack per day. He has never used smokeless tobacco. Mr. Buttram reports that he drank alcohol.   Review of Systems CONSTITUTIONAL: No weight loss, fever, chills, weakness or fatigue.  HEENT: Eyes: No visual loss, blurred vision, double vision or yellow sclerae.No hearing loss, sneezing, congestion, runny nose or sore throat.  SKIN: No rash or itching.  CARDIOVASCULAR: per hpi RESPIRATORY: No shortness of breath, cough or sputum.  GASTROINTESTINAL: No anorexia, nausea, vomiting or diarrhea. No abdominal pain or blood.  GENITOURINARY: No burning on urination, no polyuria NEUROLOGICAL: No headache, dizziness, syncope, paralysis, ataxia, numbness or tingling in the extremities. No change in bowel or bladder control.  MUSCULOSKELETAL: No muscle, back pain, joint pain or stiffness.  LYMPHATICS: No enlarged nodes. No history of splenectomy.  PSYCHIATRIC: No history of depression or anxiety.  ENDOCRINOLOGIC: No reports of sweating, cold or heat intolerance. No polyuria or polydipsia.  [facility] Kitchen   Physical  Examination Vitals:   06/19/18 1253  BP: (!) 148/74  Pulse: 84  SpO2: 98%   Vitals:   06/19/18 1253  Weight: 135 lb (61.2 kg)  Height: 5\' 7"  (1.702 m)    Gen: resting comfortably, no acute distress HEENT: no scleral icterus, pupils equal round and reactive, no palptable cervical adenopathy,  CV: RRR, no m/r/g, no jvd Resp: Clear to auscultation bilaterally GI: abdomen is soft, non-tender, non-distended, normal bowel sounds, no hepatosplenomegaly MSK: extremities are warm, no edema.  Skin: warm, no rash Neuro:  no focal deficits Psych: appropriate affect   Diagnostic Studies 12/2017 echo Study Conclusions  - Left ventricle: Distal septal / apical hypokinesis. The cavity size was mildly dilated. Wall thickness was increased in a pattern of mild LVH. Systolic function was mildly reduced. The estimated ejection fraction was in the range of 45% to 50%. Doppler parameters are consistent with abnormal left ventricular relaxation (grade 1 diastolic  dysfunction). - Aortic valve: There was mild regurgitation. Valve area (VTI): 1.61 cm^2. Valve area (Vmax): 1.67 cm^2. Valve area (Vmean): 1.66 cm^2. - Mitral valve: There was mild regurgitation. - Left atrium: The atrium was moderately dilated. - Atrial septum: No defect or patent foramen ovale was identified. - Pulmonary arteries: PA peak pressure: 35 mm Hg (S).   12/2017 CTA neck IMPRESSION: 1. 4.1 cm ascending aortic aneurysm. Recommend annual imaging followup by CTA or MRA. This recommendation follows 2010 ACCF/AHA/AATS/ACR/ASA/SCA/SCAI/SIR/STS/SVM Guidelines for the Diagnosis and Management of Patients with Thoracic Aortic Disease. Circulation. 2010; 121: H702-O378. 2. Severe predominantly fibrofatty plaque of the aorta, great vessels, and carotid bifurcations. 3. Penetrating ulcer/fibrofatty plaque ulceration of the brachiocephalic artery origin and proximal left subclavian artery. 4. Right proximal ICA  moderate 50% stenosis. 5. Left proximal ICA severe 70% stenosis. 6. Right vertebral origin moderate 50-60% stenosis. 7. Occluded left vertebral artery V1, V2, and V3 segments. Patent left V4 segment. 8. Severe emphysema. 9. Subcarinal lymphadenopathy which may be due to granulomatous disease given calcified lower paratracheal lymph nodes. Lymphoproliferative disorder or metastasis is also possible.  01/2018 nuclear stress  No diagnostic ST segment changes to indicate ischemia.  Blood pressure demonstrated a hypertensive response to exercise.  Medium sized, severe intensity, periapical defect as well as mid inferior defect, partially reversible and consistent with infarct scar and mild peri-infarct ischemia.  This is an intermediate risk study.  Nuclear stress EF: 42%.   02/2018 cath  Severe three-vessel coronary artery disease  Total occlusion of the mid LAD with left to left collaterals.  95% proximal LAD before a large diagonal [place].  The first diagonal contains 50% proximal narrowing.  Ramus intermedius with 50 to 60% mid vessel stenosis.  Large first obtuse marginal contains mid segment diffuse disease up to 70%.  RCA is totally occluded proximally.  Distal vessel fills by  left to right collaterals.  Left ventricular systolic dysfunction with apical akinesis/dyskinesis, EF 35 to 45%.  Normal LVEDP.  Post cath RECOMMENDATIONS:   The patient has severe multivessel coronary disease with chronic total occlusion of the mid LAD and proximal RCA.  In addition there is further proximal LAD disease before a large diagonal and diffuse disease in both a large ramus intermedius and first obtuse marginal.  Should be considered for surgical revascularization.  He has significant other comorbidities.  We will get TCTS consultation to determine if he is a surgical candidate.  Further management will be dependent upon surgical risk and appropriateness for surgery.  Admit to  telemetry  Start long-acting nitrates  Continue aspirin  IV heparin     Assessment and Plan  1. Chronic systolic HF -no recent symptoms - restart lisinopril 5mg  daily  2. CAD - no symptoms, continue current meds  3. Hyperlipidemia - follow up pcp labs, continue statin.        Arnoldo Lenis, M.D.

## 2018-06-19 NOTE — Patient Instructions (Signed)
Medication Instructions:  Start LISINOPRIL 5 MG DAILY   Labwork: NONE  Testing/Procedures: NONE  Follow-Up: Your physician recommends that you schedule a follow-up appointment in: 4 MONTHS    Any Other Special Instructions Will Be Listed Below (If Applicable).     If you need a refill on your cardiac medications before your next appointment, please call your pharmacy.

## 2018-06-21 ENCOUNTER — Other Ambulatory Visit: Payer: Self-pay | Admitting: Cardiology

## 2018-09-04 ENCOUNTER — Emergency Department (HOSPITAL_COMMUNITY): Payer: Medicare Other

## 2018-09-04 ENCOUNTER — Other Ambulatory Visit: Payer: Self-pay

## 2018-09-04 ENCOUNTER — Inpatient Hospital Stay (HOSPITAL_COMMUNITY)
Admission: EM | Admit: 2018-09-04 | Discharge: 2018-10-04 | DRG: 064 | Disposition: E | Payer: Medicare Other | Attending: Internal Medicine | Admitting: Internal Medicine

## 2018-09-04 ENCOUNTER — Encounter (HOSPITAL_COMMUNITY): Payer: Self-pay | Admitting: Emergency Medicine

## 2018-09-04 DIAGNOSIS — R402212 Coma scale, best verbal response, none, at arrival to emergency department: Secondary | ICD-10-CM | POA: Diagnosis present

## 2018-09-04 DIAGNOSIS — R402112 Coma scale, eyes open, never, at arrival to emergency department: Secondary | ICD-10-CM | POA: Diagnosis present

## 2018-09-04 DIAGNOSIS — S065XAA Traumatic subdural hemorrhage with loss of consciousness status unknown, initial encounter: Secondary | ICD-10-CM | POA: Diagnosis present

## 2018-09-04 DIAGNOSIS — J449 Chronic obstructive pulmonary disease, unspecified: Secondary | ICD-10-CM | POA: Diagnosis present

## 2018-09-04 DIAGNOSIS — Z7189 Other specified counseling: Secondary | ICD-10-CM | POA: Diagnosis not present

## 2018-09-04 DIAGNOSIS — I6523 Occlusion and stenosis of bilateral carotid arteries: Secondary | ICD-10-CM | POA: Diagnosis present

## 2018-09-04 DIAGNOSIS — E785 Hyperlipidemia, unspecified: Secondary | ICD-10-CM | POA: Diagnosis present

## 2018-09-04 DIAGNOSIS — R402114 Coma scale, eyes open, never, 24 hours or more after hospital admission: Secondary | ICD-10-CM | POA: Diagnosis not present

## 2018-09-04 DIAGNOSIS — I4891 Unspecified atrial fibrillation: Secondary | ICD-10-CM | POA: Diagnosis present

## 2018-09-04 DIAGNOSIS — I110 Hypertensive heart disease with heart failure: Secondary | ICD-10-CM | POA: Diagnosis present

## 2018-09-04 DIAGNOSIS — I609 Nontraumatic subarachnoid hemorrhage, unspecified: Secondary | ICD-10-CM | POA: Diagnosis present

## 2018-09-04 DIAGNOSIS — R402322 Coma scale, best motor response, extension, at arrival to emergency department: Secondary | ICD-10-CM | POA: Diagnosis present

## 2018-09-04 DIAGNOSIS — I7121 Aneurysm of the ascending aorta, without rupture: Secondary | ICD-10-CM | POA: Diagnosis present

## 2018-09-04 DIAGNOSIS — Z720 Tobacco use: Secondary | ICD-10-CM | POA: Diagnosis present

## 2018-09-04 DIAGNOSIS — R402324 Coma scale, best motor response, extension, 24 hours or more after hospital admission: Secondary | ICD-10-CM | POA: Diagnosis not present

## 2018-09-04 DIAGNOSIS — Z8673 Personal history of transient ischemic attack (TIA), and cerebral infarction without residual deficits: Secondary | ICD-10-CM

## 2018-09-04 DIAGNOSIS — Z79899 Other long term (current) drug therapy: Secondary | ICD-10-CM

## 2018-09-04 DIAGNOSIS — Z66 Do not resuscitate: Secondary | ICD-10-CM | POA: Diagnosis present

## 2018-09-04 DIAGNOSIS — Z7902 Long term (current) use of antithrombotics/antiplatelets: Secondary | ICD-10-CM

## 2018-09-04 DIAGNOSIS — R402113 Coma scale, eyes open, never, at hospital admission: Secondary | ICD-10-CM | POA: Diagnosis not present

## 2018-09-04 DIAGNOSIS — Z8249 Family history of ischemic heart disease and other diseases of the circulatory system: Secondary | ICD-10-CM

## 2018-09-04 DIAGNOSIS — G935 Compression of brain: Secondary | ICD-10-CM | POA: Diagnosis present

## 2018-09-04 DIAGNOSIS — R402213 Coma scale, best verbal response, none, at hospital admission: Secondary | ICD-10-CM | POA: Diagnosis not present

## 2018-09-04 DIAGNOSIS — I712 Thoracic aortic aneurysm, without rupture: Secondary | ICD-10-CM | POA: Diagnosis not present

## 2018-09-04 DIAGNOSIS — I6200 Nontraumatic subdural hemorrhage, unspecified: Secondary | ICD-10-CM | POA: Diagnosis present

## 2018-09-04 DIAGNOSIS — Z87442 Personal history of urinary calculi: Secondary | ICD-10-CM

## 2018-09-04 DIAGNOSIS — I10 Essential (primary) hypertension: Secondary | ICD-10-CM | POA: Diagnosis not present

## 2018-09-04 DIAGNOSIS — G253 Myoclonus: Secondary | ICD-10-CM | POA: Diagnosis present

## 2018-09-04 DIAGNOSIS — I252 Old myocardial infarction: Secondary | ICD-10-CM

## 2018-09-04 DIAGNOSIS — Z8546 Personal history of malignant neoplasm of prostate: Secondary | ICD-10-CM

## 2018-09-04 DIAGNOSIS — Z7982 Long term (current) use of aspirin: Secondary | ICD-10-CM

## 2018-09-04 DIAGNOSIS — J9811 Atelectasis: Secondary | ICD-10-CM | POA: Diagnosis present

## 2018-09-04 DIAGNOSIS — I5042 Chronic combined systolic (congestive) and diastolic (congestive) heart failure: Secondary | ICD-10-CM | POA: Diagnosis not present

## 2018-09-04 DIAGNOSIS — Z515 Encounter for palliative care: Secondary | ICD-10-CM | POA: Diagnosis not present

## 2018-09-04 DIAGNOSIS — S065X9A Traumatic subdural hemorrhage with loss of consciousness of unspecified duration, initial encounter: Secondary | ICD-10-CM | POA: Diagnosis not present

## 2018-09-04 DIAGNOSIS — Z809 Family history of malignant neoplasm, unspecified: Secondary | ICD-10-CM

## 2018-09-04 DIAGNOSIS — I2510 Atherosclerotic heart disease of native coronary artery without angina pectoris: Secondary | ICD-10-CM | POA: Diagnosis present

## 2018-09-04 DIAGNOSIS — J9601 Acute respiratory failure with hypoxia: Secondary | ICD-10-CM | POA: Insufficient documentation

## 2018-09-04 DIAGNOSIS — R51 Headache: Secondary | ICD-10-CM | POA: Diagnosis present

## 2018-09-04 DIAGNOSIS — Z955 Presence of coronary angioplasty implant and graft: Secondary | ICD-10-CM

## 2018-09-04 DIAGNOSIS — R402313 Coma scale, best motor response, none, at hospital admission: Secondary | ICD-10-CM | POA: Diagnosis not present

## 2018-09-04 DIAGNOSIS — R402224 Coma scale, best verbal response, incomprehensible words, 24 hours or more after hospital admission: Secondary | ICD-10-CM | POA: Diagnosis not present

## 2018-09-04 DIAGNOSIS — F17210 Nicotine dependence, cigarettes, uncomplicated: Secondary | ICD-10-CM | POA: Diagnosis present

## 2018-09-04 LAB — CBC WITH DIFFERENTIAL/PLATELET
ABS IMMATURE GRANULOCYTES: 0.04 10*3/uL (ref 0.00–0.07)
BASOS ABS: 0 10*3/uL (ref 0.0–0.1)
Basophils Relative: 0 %
Eosinophils Absolute: 0.2 10*3/uL (ref 0.0–0.5)
Eosinophils Relative: 2 %
HCT: 39 % (ref 39.0–52.0)
Hemoglobin: 12.2 g/dL — ABNORMAL LOW (ref 13.0–17.0)
Immature Granulocytes: 0 %
Lymphocytes Relative: 25 %
Lymphs Abs: 2.5 10*3/uL (ref 0.7–4.0)
MCH: 26.6 pg (ref 26.0–34.0)
MCHC: 31.3 g/dL (ref 30.0–36.0)
MCV: 85 fL (ref 80.0–100.0)
Monocytes Absolute: 0.8 10*3/uL (ref 0.1–1.0)
Monocytes Relative: 8 %
NRBC: 0 % (ref 0.0–0.2)
Neutro Abs: 6.6 10*3/uL (ref 1.7–7.7)
Neutrophils Relative %: 65 %
Platelets: 222 10*3/uL (ref 150–400)
RBC: 4.59 MIL/uL (ref 4.22–5.81)
RDW: 15.9 % — ABNORMAL HIGH (ref 11.5–15.5)
WBC: 10.1 10*3/uL (ref 4.0–10.5)

## 2018-09-04 LAB — RAPID URINE DRUG SCREEN, HOSP PERFORMED
Amphetamines: NOT DETECTED
BENZODIAZEPINES: NOT DETECTED
Barbiturates: NOT DETECTED
Cocaine: NOT DETECTED
Opiates: NOT DETECTED
Tetrahydrocannabinol: NOT DETECTED

## 2018-09-04 LAB — BLOOD GAS, ARTERIAL
Acid-base deficit: 1.2 mmol/L (ref 0.0–2.0)
Bicarbonate: 23.1 mmol/L (ref 20.0–28.0)
FIO2: 100
O2 Saturation: 99.5 %
Patient temperature: 36.6
pCO2 arterial: 46.1 mmHg (ref 32.0–48.0)
pH, Arterial: 7.334 — ABNORMAL LOW (ref 7.350–7.450)
pO2, Arterial: 244 mmHg — ABNORMAL HIGH (ref 83.0–108.0)

## 2018-09-04 LAB — COMPREHENSIVE METABOLIC PANEL
ALT: 13 U/L (ref 0–44)
AST: 21 U/L (ref 15–41)
Albumin: 3.1 g/dL — ABNORMAL LOW (ref 3.5–5.0)
Alkaline Phosphatase: 88 U/L (ref 38–126)
Anion gap: 12 (ref 5–15)
BUN: 21 mg/dL (ref 8–23)
CO2: 24 mmol/L (ref 22–32)
Calcium: 9 mg/dL (ref 8.9–10.3)
Chloride: 103 mmol/L (ref 98–111)
Creatinine, Ser: 0.97 mg/dL (ref 0.61–1.24)
GFR calc Af Amer: >60 mL/min (ref >60)
GFR calc non Af Amer: >60 mL/min (ref >60)
Glucose, Bld: 143 mg/dL — ABNORMAL HIGH (ref 70–99)
Potassium: 3.4 mmol/L — ABNORMAL LOW (ref 3.5–5.1)
Sodium: 139 mmol/L (ref 135–145)
Total Bilirubin: 0.7 mg/dL (ref 0.3–1.2)
Total Protein: 7 g/dL (ref 6.5–8.1)

## 2018-09-04 LAB — URINALYSIS, ROUTINE W REFLEX MICROSCOPIC
BACTERIA UA: NONE SEEN
Bilirubin Urine: NEGATIVE
Glucose, UA: NEGATIVE mg/dL
Ketones, ur: NEGATIVE mg/dL
Leukocytes, UA: NEGATIVE
Nitrite: NEGATIVE
Protein, ur: NEGATIVE mg/dL
Specific Gravity, Urine: 1.009 (ref 1.005–1.030)
pH: 7 (ref 5.0–8.0)

## 2018-09-04 LAB — PROTIME-INR
INR: 1.17
Prothrombin Time: 14.8 seconds (ref 11.4–15.2)

## 2018-09-04 LAB — ETHANOL: Alcohol, Ethyl (B): <10 mg/dL (ref <10)

## 2018-09-04 LAB — TROPONIN I: Troponin I: <0.03 ng/mL (ref <0.03)

## 2018-09-04 LAB — APTT: aPTT: 33 seconds (ref 24–36)

## 2018-09-04 MED ORDER — POLYVINYL ALCOHOL 1.4 % OP SOLN: 2.0000 [drp] | OPHTHALMIC | Status: DC | PRN

## 2018-09-04 MED ORDER — MORPHINE SULFATE (PF) 2 MG/ML IV SOLN
2.0000 mg | INTRAVENOUS | Status: DC | PRN
  Administered 2018-09-04: 2 mg via INTRAVENOUS
  Filled 2018-09-04 (×2): qty 1

## 2018-09-04 MED ORDER — ACETAMINOPHEN 650 MG RE SUPP: 650.0000 mg | Freq: Four times a day (QID) | RECTAL | Status: DC | PRN

## 2018-09-04 MED ORDER — LORAZEPAM 2 MG/ML IJ SOLN: 0.5000 mg | INTRAMUSCULAR | Status: DC | PRN

## 2018-09-04 MED ORDER — ONDANSETRON HCL 4 MG PO TABS: 4.0000 mg | ORAL_TABLET | Freq: Four times a day (QID) | ORAL | Status: DC | PRN

## 2018-09-04 MED ORDER — ROCURONIUM BROMIDE 50 MG/5ML IV SOLN
70.0000 mg | Freq: Once | INTRAVENOUS | Status: AC
  Administered 2018-09-04: 70 mg via INTRAVENOUS

## 2018-09-04 MED ORDER — LORAZEPAM 2 MG/ML IJ SOLN
1.0000 mg | INTRAMUSCULAR | Status: DC | PRN
  Administered 2018-09-04: 1 mg via INTRAVENOUS
  Filled 2018-09-04: qty 1

## 2018-09-04 MED ORDER — ACETAMINOPHEN 325 MG PO TABS: 650.0000 mg | ORAL_TABLET | Freq: Four times a day (QID) | ORAL | Status: DC | PRN

## 2018-09-04 MED ORDER — MORPHINE 100MG IN NS 100ML (1MG/ML) PREMIX INFUSION
2.0000 mg/h | INTRAVENOUS | Status: DC
  Administered 2018-09-04 – 2018-09-06 (×2): 2 mg/h via INTRAVENOUS
  Filled 2018-09-04 (×2): qty 100

## 2018-09-04 MED ORDER — ETOMIDATE 2 MG/ML IV SOLN
20.0000 mg | Freq: Once | INTRAVENOUS | Status: AC
  Administered 2018-09-04: 20 mg via INTRAVENOUS

## 2018-09-04 MED ORDER — NICARDIPINE HCL IN NACL 20-0.86 MG/200ML-% IV SOLN: 0.0000 mg/h | INTRAVENOUS | Status: DC

## 2018-09-04 MED ORDER — ONDANSETRON HCL 4 MG/2ML IJ SOLN: 4.0000 mg | Freq: Four times a day (QID) | INTRAMUSCULAR | Status: DC | PRN

## 2018-09-04 NOTE — ED Provider Notes (Signed)
[facility] Provider Note   CSN: [id number] Arrival date & time: 08/31/2018  1058     History   Chief Complaint Chief Complaint  Patient presents with  . Code Stroke    HPI Daniel Simon is a 84 y.o. male.  Patient with history of coronary artery disease and COPD presents unresponsive.  Patient complained about a headache and shortly after collapsed and had right-sided symptoms.  No history of similar.  Unable to obtain details from patient and wife arrived later to attest to the story.  Patient is on Plavix per review of medical records.     Past Medical History:  Diagnosis Date  . CAD (coronary artery disease)    a. 02/2018: 3-vessel CAD by cath --> not felt to be a CABG candidate, therefore s/p DES to proximal LAD.   [facility])    prostate  . Carotid stenosis, right   . COPD (chronic obstructive pulmonary disease) ([place])   . History of kidney stones   . Hyperlipidemia LDL goal <70   . Hypertension   . Myocardial infarction ([place])   . Stroke [facility])     Patient Active Problem List   Diagnosis Date Noted  . Subdural hematoma ([place]) 08/17/2018  . Subarachnoid hemorrhage ([place]) 08/11/2018  . Acute respiratory failure with hypoxia ([place])   . Goals of care, counseling/discussion   . Palliative care by specialist   . End of life care   . Carotid artery stenosis 04/30/2018  . Carotid artery stenosis, symptomatic, right 03/12/2018  . Carotid disease, bilateral ([place]) 02/18/2018  . CAD (coronary artery disease), native coronary artery 02/10/2018  . Abnormal stress test   . DOE (dyspnea on exertion)   . Chronic combined systolic and diastolic heart failure ([place])   . Ascending aortic aneurysm ([place]) 12/26/2017  . Carotid stenosis, bilateral 12/26/2017  . Lymphadenopathy 12/26/2017  . Stroke (cerebrum) ([place]) 12/24/2017  . Tobacco abuse 12/24/2017  . Left sided numbness 12/24/2017  . HTN (hypertension) 12/24/2017  . Acute CHF (congestive heart  failure) ([place]) 12/24/2017    Past Surgical History:  Procedure Laterality Date  . CARDIAC CATHETERIZATION    . CORONARY STENT INTERVENTION N/A 02/12/2018   Procedure: CORONARY STENT INTERVENTION;  Surgeon: Belva Crome, MD;  Location: [facility];  Service: Cardiovascular;  Laterality: N/A;  . LEFT HEART CATH AND CORONARY ANGIOGRAPHY N/A 02/10/2018   Procedure: LEFT HEART CATH AND CORONARY ANGIOGRAPHY;  Surgeon: Belva Crome, MD;  Location: [facility];  Service: Cardiovascular;  Laterality: N/A;  . PROSTATE BIOPSY    . TRANSCAROTID ARTERY REVASCULARIZATION (TCAR)  03/12/2018  . TRANSCAROTID ARTERY REVASCULARIZATION Right 03/12/2018   Procedure: TRANSCAROTID ARTERY REVASCULARIZATION RIGHT;  Surgeon: Conrad Burnet, MD;  Location: [place];  Service: Vascular;  Laterality: Right;  . TRANSCAROTID ARTERY REVASCULARIZATION Left 04/30/2018   Procedure: LEFT TRANSCAROTID ARTERY REVASCULARIZATION;  Surgeon: Waynetta Sandy, MD;  Location: [place];  Service: Vascular;  Laterality: Left;        Home Medications    Prior to Admission medications   Medication Sig Start Date End Date Taking? Authorizing Provider  albuterol (PROVENTIL HFA;VENTOLIN HFA) 108 (90 Base) MCG/ACT inhaler Inhale 2 puffs into the lungs every 6 (six) hours as needed for wheezing or shortness of breath. 12/26/17  Yes Kathie Dike, MD  aspirin EC 81 MG tablet Take 81 mg by mouth daily.   Yes [provider]  atorvastatin (LIPITOR) 80 MG tablet Take  1 tablet (80 mg total) by mouth daily at 6 PM. 02/13/18  Yes Kroeger, Daleen Snook M., PA-C  carvedilol (COREG) 6.25 MG tablet TAKE 1 TABLET (6.25 MG TOTAL) BY MOUTH 2 (TWO) TIMES DAILY WITH A MEAL. 04/27/18  Yes Strader, Fransisco Hertz, PA-C  clopidogrel (PLAVIX) 75 MG tablet Take 1 tablet (75 mg total) by mouth daily with breakfast. 02/13/18  Yes Kroeger, Daleen Snook M., PA-C  furosemide (LASIX) 40 MG tablet Take 1 tablet (40 mg total) by mouth daily. 12/26/17 12/26/18  Yes Kathie Dike, MD  isosorbide mononitrate (IMDUR) 30 MG 24 hr tablet Take 1 tablet (30 mg total) by mouth daily. 02/13/18  Yes Kroeger, Daleen Snook M., PA-C  KLOR-CON M20 20 MEQ tablet TAKE 1 TABLET BY MOUTH ONCE DAILY 06/22/18  Yes Branch, Alphonse Guild, MD  lisinopril (PRINIVIL,ZESTRIL) 5 MG tablet Take 1 tablet (5 mg total) by mouth daily. 06/19/18 09/17/18 Yes Branch, Alphonse Guild, MD  nitroGLYCERIN (NITROSTAT) 0.4 MG SL tablet Place 1 tablet (0.4 mg total) under the tongue every 5 (five) minutes as needed for chest pain. 02/13/18 02/13/19 Yes Kroeger, Lorelee Cover., PA-C  oxyCODONE-acetaminophen (PERCOCET/ROXICET) 5-325 MG tablet Take 1 tablet by mouth every 6 (six) hours as needed for moderate pain. 05/01/18  Yes Dagoberto Ligas, PA-C  SPIRIVA HANDIHALER 18 MCG inhalation capsule INHALE 1 CAPSULE VIA HANDIHALER ONCE DAILY AT THE SAME TIME EVERY DAY Patient taking differently: Place 18 mcg into inhaler and inhale daily as needed (shortness of breath).  02/24/18  Yes BranchAlphonse Guild, MD    Family History Family History  Problem Relation Age of Onset  . Cancer Sister   . Cancer Sister   . Heart attack Brother   . Heart disease Brother     Social History Social History   Tobacco Use  . Smoking status: Current Every Day Smoker    Packs/day: 1.00    Types: Cigarettes    Start date: 09/24/1948  . Smokeless tobacco: Never Used  Substance Use Topics  . Alcohol use: Not Currently    Frequency: Never  . Drug use: Never     Allergies   Patient has no known allergies.   Review of Systems Review of Systems  Unable to perform ROS: Patient unresponsive     Physical Exam Updated Vital Signs BP (!) 196/103   Pulse 86   Resp 18   Ht 5\' 7"  (1.702 m)   SpO2 95%   BMI 21.14 kg/m   Physical Exam Vitals signs and nursing note reviewed.  Constitutional:      Appearance: He is ill-appearing.  HENT:     Head: Normocephalic.     Nose: No congestion.     Mouth/Throat:     Mouth: Mucous  membranes are dry.  Eyes:     Comments: Right pupil 5 mm nonresponsive left pupil 2 mm  Neck:     Musculoskeletal: Neck supple.  Cardiovascular:     Rate and Rhythm: Normal rate.  Abdominal:     General: Abdomen is flat. There is no distension.  Musculoskeletal:        General: No swelling.  Skin:    General: Skin is warm.     Capillary Refill: Capillary refill takes less than 2 seconds.  Neurological:     Comments: Patient has blown right pupil, difficult exam nonverbal will not follow commands.  Upgoing toes bilateral  Psychiatric:     Comments: Unresponsive      ED Treatments / Results  Labs (all labs ordered  are listed, but only abnormal results are displayed) Labs Reviewed  COMPREHENSIVE METABOLIC PANEL - Abnormal; Notable for the following components:      Result Value   Potassium 3.4 (*)    Glucose, Bld 143 (*)    Albumin 3.1 (*)    All other components within normal limits  URINALYSIS, ROUTINE W REFLEX MICROSCOPIC - Abnormal; Notable for the following components:   Color, Urine STRAW (*)    Hgb urine dipstick SMALL (*)    All other components within normal limits  CBC WITH DIFFERENTIAL/PLATELET - Abnormal; Notable for the following components:   Hemoglobin 12.2 (*)    RDW 15.9 (*)    All other components within normal limits  BLOOD GAS, ARTERIAL - Abnormal; Notable for the following components:   pH, Arterial 7.334 (*)    pO2, Arterial 244 (*)    Allens test (pass/fail) BRACHIAL ARTERY (*)    All other components within normal limits  ETHANOL  PROTIME-INR  APTT  RAPID URINE DRUG SCREEN, HOSP PERFORMED  TROPONIN I    EKG None  Radiology Dg Chest Portable 1 View  Result Date: 08/21/2018 CLINICAL DATA:  Intubation.  Intracranial hemorrhage. EXAM: PORTABLE CHEST 1 VIEW COMPARISON:  Chest x-ray dated 12/24/2017 FINDINGS: Endotracheal tube is 6 cm above the carina in good position. NG tube tip is below the diaphragm. Heart size and pulmonary vascularity are  normal. There is chronic accentuation of the interstitial markings at the lung bases. No acute infiltrates or effusions. No acute bone abnormality. IMPRESSION: 1. Endotracheal tube and NG tube appear in good position. 2. Chronic interstitial disease at the lung bases. 3. No acute abnormalities. Electronically Signed   By: Lorriane Shire M.D.   On: 08/26/2018 11:59   Ct Head Code Stroke Wo Contrast  Result Date: 08/21/2018 CLINICAL DATA:  Code stroke. Unresponsiveness with headache and weakness EXAM: CT HEAD WITHOUT CONTRAST TECHNIQUE: Contiguous axial images were obtained from the base of the skull through the vertex without intravenous contrast. COMPARISON:  12/24/2017 FINDINGS: Brain: Large mixed density subdural hematoma along the right cerebral convexity, up to 3.5 cm in thickness. There is resultant midline shift of 2.4 cm and asymmetric dilatation of the left lateral ventricle. Thin subdural hematoma seen along the posterior falx, measuring up to 5 mm in thickness. Subarachnoid hemorrhage is seen in the interhemispheric fissure, along the left frontal convexity, and right sylvian fissure. Remote lateral lenticulostriate infarct on the left. There is uncal and subfalcine herniation without ACA or PCA territory infarct noted. Vascular: Negative Skull: No evident fracture. Sinuses/Orbits: No acute finding Other: Critical Value/emergent results were called by telephone at the time of interpretation on 08/21/2018 at 11:12 am to Dr. Elnora Morrison , who verbally acknowledged these results. IMPRESSION: 1. Large acute subdural hematoma along the right cerebral convexity measuring up to 3.5 cm thickness and causing subfalcine and uncal herniation with midline shift of 2.4 cm. 2. Patchy peripheral subarachnoid hemorrhage. Thin subdural hematoma along the posterior falx. 3. Lateral left ventriculomegaly with a degree of entrapment based on prior head CT. Electronically Signed   By: Monte Fantasia M.D.   On: 08/14/2018  11:16    Procedures .Critical Care Performed by: Elnora Morrison, MD Authorized by: Elnora Morrison, MD   Critical care provider statement:    Critical care time (minutes):  80   Critical care start time:  09/03/2018 11:45 PM   Critical care end time:  08/10/2018 1:05 PM   Critical care time was exclusive of:  Separately billable procedures and treating other patients and teaching time   Critical care was necessary to treat or prevent imminent or life-threatening deterioration of the following conditions:  CNS failure or compromise   Critical care was time spent personally by me on the following activities:  Discussions with consultants, evaluation of patient's response to treatment, examination of patient, ordering and performing treatments and interventions, ordering and review of laboratory studies, ordering and review of radiographic studies, pulse oximetry, re-evaluation of patient's condition, obtaining history from patient or surrogate and review of old charts Procedure Name: Intubation Date/Time: 08/27/2018 4:43 PM Performed by: Elnora Morrison, MD Pre-anesthesia Checklist: Patient identified, Patient being monitored, Emergency Drugs available, Timeout performed and Suction available Oxygen Delivery Method: Non-rebreather mask Preoxygenation: Pre-oxygenation with 100% oxygen Induction Type: Rapid sequence Ventilation: Mask ventilation without difficulty Laryngoscope Size: Glidescope and 3 Grade View: Grade II Tube size: 7.5 mm Number of attempts: 1 Placement Confirmation: ETT inserted through vocal cords under direct vision,  CO2 detector and Breath sounds checked- equal and bilateral      (including critical care time)  Medications Ordered in ED Medications  nicardipine (CARDENE) 20mg  in 0.86% saline 253ml IV infusion (0.1 mg/ml) (0 mg/hr Intravenous Not Given 09/03/2018 1200)  acetaminophen (TYLENOL) tablet 650 mg (has no administration in time range)    Or  acetaminophen  (TYLENOL) suppository 650 mg (has no administration in time range)  ondansetron (ZOFRAN) tablet 4 mg (has no administration in time range)    Or  ondansetron (ZOFRAN) injection 4 mg (has no administration in time range)  morphine 100mg  in NS 159mL (1mg /mL) infusion - premix (2 mg/hr Intravenous New Bag/Given 08/25/2018 1556)  LORazepam (ATIVAN) injection 0.5-2 mg (has no administration in time range)  polyvinyl alcohol (LIQUIFILM TEARS) 1.4 % ophthalmic solution 2 drop (has no administration in time range)  etomidate (AMIDATE) injection 20 mg (20 mg Intravenous Given 09/03/2018 1147)  rocuronium (ZEMURON) injection 70 mg (70 mg Intravenous Given 08/13/2018 1147)     Initial Impression / Assessment and Plan / ED Course  I have reviewed the triage vital signs and the nursing notes.  Pertinent labs & imaging results that were available during my care of the patient were reviewed by me and considered in my medical decision making (see chart for details).    Patient presents as code stroke and unresponsive.  Patient taken immediately to CT scan for code stroke and shortly after CT scans discussed with radiology and reviewed showing significant subdural with shift and signs early herniation.  Patient intubated for airway protection and further support as patient full code on arrival.  Blood work sent and reviewed.  Patient is on Plavix.  Cardene ordered as needed for blood pressure to keep under 144 systolic.  Patient's blood pressure dropped prior to even starting, held.  Discussed with neurosurgery who recommended comfort care with patient being on Plavix and age no chance of significant functional outcome.  Discussed with hospitalist for ICU admission and to gradually switch to comfort care.  Hospitalist discussed with patient's wife as well.  The patients results and plan were reviewed and discussed.   Any x-rays performed were independently reviewed by myself.   Differential diagnosis were considered  with the presenting HPI.  Medications  nicardipine (CARDENE) 20mg  in 0.86% saline 219ml IV infusion (0.1 mg/ml) (0 mg/hr Intravenous Not Given 08/29/2018 1200)  acetaminophen (TYLENOL) tablet 650 mg (has no administration in time range)    Or  acetaminophen (TYLENOL) suppository 650 mg (has  no administration in time range)  ondansetron (ZOFRAN) tablet 4 mg (has no administration in time range)    Or  ondansetron (ZOFRAN) injection 4 mg (has no administration in time range)  morphine 100mg  in NS 171mL (1mg /mL) infusion - premix (2 mg/hr Intravenous New Bag/Given 08/26/2018 1556)  LORazepam (ATIVAN) injection 0.5-2 mg (has no administration in time range)  polyvinyl alcohol (LIQUIFILM TEARS) 1.4 % ophthalmic solution 2 drop (has no administration in time range)  etomidate (AMIDATE) injection 20 mg (20 mg Intravenous Given 08/05/2018 1147)  rocuronium (ZEMURON) injection 70 mg (70 mg Intravenous Given 08/07/2018 1147)    Vitals:   08/22/2018 1245 08/10/2018 1300 08/30/2018 1400 08/23/2018 1600  BP: (!) 201/95 (!) 196/103    Pulse:   85 86  Resp: 18 18 (!) 23 18  SpO2:   91% 95%  Height:        Final diagnoses:  Subdural hemorrhage ([place])       Final Clinical Impressions(s) / ED Diagnoses   Final diagnoses:  Subdural hemorrhage [facility])    ED Discharge Orders    None       Elnora Morrison, MD 08/17/2018 1646

## 2018-09-04 NOTE — Progress Notes (Signed)
Code Stroke Times  7471 called  Not beeped 1059 exam started 1104 exam ended  1108 images sent, exam completed, GR called

## 2018-09-04 NOTE — ED Notes (Signed)
Called [facility] to page Neurosurgery per Dr, Reather Converse.

## 2018-09-04 NOTE — Procedures (Signed)
Extubation Procedure Note  Patient Details:   Name: Daniel Simon DOB: 11-15-1934 MRN: [id number]   Airway Documentation:  Airway 7.5 mm (Active)  Secured at (cm) 21 cm 08/07/2018 11:37 AM  Measured From Lips 08/27/2018 11:37 AM  Secured Location Right 09/03/2018 11:37 AM  Secured By [employer] 08/09/2018 11:37 AM  Tube Holder Repositioned Yes 09/03/2018 11:37 AM  Site Condition Dry 08/29/2018 11:37 AM   Vent end date: (not recorded) Vent end time: (not recorded)   Evaluation  O2 sats: stable throughout Complications: No apparent complications Patient did tolerate procedure well. Bilateral Breath Sounds: Diminished(coarse)   No   Terminal extubation  Placed patient on 4LNC for comfort  Leida Lauth 08/25/2018, 4:01 PM

## 2018-09-04 NOTE — H&P (Signed)
History and Physical  KWALI WRINKLE NTI:[id number] DOB: 1935-05-21 DOA: 08/05/2018   PCP: Dione Housekeeper, MD   Patient coming from: Home  Chief Complaint: headache and unresponsiveness  HPI:  Daniel Simon is a 84 y.o. male with medical history of coronary artery disease, hyperlipidemia, stroke, aortic aneurysm, COPD, and hypertension presenting with unresponsiveness.  The patient had been in his usual state of health until this morning around 10 AM when he and his wife came back home after going to the grocery store.  They were in the process of bringing the groceries back into the house when the patient fell onto his left side in the yard.  The patient's wife dragged the patient back into the house and placed him on the couch.  She noted that the patient had left-sided weakness.  She also stated that he had started complaining of a headache as she was bringing him back into the house.  After the patient was placed on the couch, he became unresponsive.  EMS was activated.  The patient remained unresponsive upon EMS arrival.  In the emergency department, the patient was intubated.  CT of the brain showed a large acute subdural hemorrhage in the right cerebral convexity measuring 3.5 cm in thickness causing subfalcine and uncal herniation with 2.4 cm midline shift.  There is also patchy peripheral subarachnoid hemorrhage.  EDP spoke with neurosurgery, Dr. Trenton Gammon who felt that this was not survivable.  In the emergency department, the patient was afebrile and hemodynamically stable.  BMP was unremarkable except for potassium 3.4.  LFTs and CBC were essentially unremarkable.  Chest x-ray showed chronic residual changes and bibasilar atelectasis.  Palliative medicine was consulted to assist with symptom management.  Assessment/Plan: Subdural hemorrhage/subarachnoid hemorrhage -According to neurosurgery, this is nonsurvivable given the degree of herniation -This was discussed with the patient's  spouse and daughter at the bedside -Family felt that the patient would not want to remain on life support, and they wanted the patient to remain comfortable and maintain his dignity. -As result, the patient will be admitted to the ICU to allow his family to come visit -Family agrees to focus the patient's care to maintain comfort and dignity, and they do not want any heroic measures -Palliative medicine was consulted to assist with symptom management   Acute respiratory failure with hypoxia -Secondary to hypoventilation from the patient's catastrophic intracranial event -Patient currently intubated -Palliative medicine consulted -Plan for compassionate extubation  Atrial fibrillation with RVR, type unspecified -As the patient's focus of care will now remain on his full comfort and dignity, this will no longer be an active issue at this point  Chronic systolic and diastolic CHF -Clinically euvolemic -12/25/2017 echo EF 45-50%, grade 1 DD -No longer active issue as the patient's focus of care will remain on his comfort and dignity  Coronary artery disease -Status post DES to the LAD July 2019 -No longer active issue as the patient's focus of care will remain on his comfort and dignity  Hyperlipidemia -No longer active issue as the patient's focus of care will remain on his comfort and dignity  COPD -No longer active issue as the patient's focus of care will remain on his comfort and dignity  Essential hypertension -No longer active issue as the patient's focus of care will remain on his comfort and dignity  Ascending Aortic Aneurysm -No longer active issue as the patient's focus of care will remain on his comfort and dignity  Past Medical History:  Diagnosis Date  . CAD (coronary artery disease)    a. 02/2018: 3-vessel CAD by cath --> not felt to be a CABG candidate, therefore s/p DES to proximal LAD.   [facility])    prostate  . Carotid stenosis, right   . COPD  (chronic obstructive pulmonary disease) ([place])   . History of kidney stones   . Hyperlipidemia LDL goal <70   . Hypertension   . Myocardial infarction ([place])   . Stroke [facility])    Past Surgical History:  Procedure Laterality Date  . CARDIAC CATHETERIZATION    . CORONARY STENT INTERVENTION N/A 02/12/2018   Procedure: CORONARY STENT INTERVENTION;  Surgeon: Belva Crome, MD;  Location: [place] [facility];  Service: Cardiovascular;  Laterality: N/A;  . LEFT HEART CATH AND CORONARY ANGIOGRAPHY N/A 02/10/2018   Procedure: LEFT HEART CATH AND CORONARY ANGIOGRAPHY;  Surgeon: Belva Crome, MD;  Location: [place] [facility];  Service: Cardiovascular;  Laterality: N/A;  . PROSTATE BIOPSY    . TRANSCAROTID ARTERY REVASCULARIZATION (TCAR)  03/12/2018  . TRANSCAROTID ARTERY REVASCULARIZATION Right 03/12/2018   Procedure: TRANSCAROTID ARTERY REVASCULARIZATION RIGHT;  Surgeon: Conrad Killian, MD;  Location: [place];  Service: Vascular;  Laterality: Right;  . TRANSCAROTID ARTERY REVASCULARIZATION Left 04/30/2018   Procedure: LEFT TRANSCAROTID ARTERY REVASCULARIZATION;  Surgeon: Waynetta Sandy, MD;  Location: [place];  Service: Vascular;  Laterality: Left;   Social History:  reports that he has been smoking cigarettes. He started smoking about 69 years ago. He has been smoking about 1.00 pack per day. He has never used smokeless tobacco. He reports previous alcohol use. He reports that he does not use drugs.   Family History  Problem Relation Age of Onset  . Cancer Sister   . Cancer Sister   . Heart attack Brother   . Heart disease Brother      No Known Allergies   Prior to Admission medications   Medication Sig Start Date End Date Taking? Authorizing Provider  albuterol (PROVENTIL HFA;VENTOLIN HFA) 108 (90 Base) MCG/ACT inhaler Inhale 2 puffs into the lungs every 6 (six) hours as needed for wheezing or shortness of breath. 12/26/17   Kathie Dike, MD  aspirin EC 81 MG tablet Take 81 mg by  mouth daily.    [provider]  atorvastatin (LIPITOR) 80 MG tablet Take 1 tablet (80 mg total) by mouth daily at 6 PM. 02/13/18   Kroeger, Lorelee Cover., PA-C  carvedilol (COREG) 6.25 MG tablet TAKE 1 TABLET (6.25 MG TOTAL) BY MOUTH 2 (TWO) TIMES DAILY WITH A MEAL. 04/27/18   Strader, Fransisco Hertz, PA-C  clopidogrel (PLAVIX) 75 MG tablet Take 1 tablet (75 mg total) by mouth daily with breakfast. 02/13/18   Kroeger, Lorelee Cover., PA-C  furosemide (LASIX) 40 MG tablet Take 1 tablet (40 mg total) by mouth daily. 12/26/17 12/26/18  Kathie Dike, MD  isosorbide mononitrate (IMDUR) 30 MG 24 hr tablet Take 1 tablet (30 mg total) by mouth daily. 02/13/18   Kroeger, Daleen Snook M., PA-C  KLOR-CON M20 20 MEQ tablet TAKE 1 TABLET BY MOUTH ONCE DAILY 06/22/18   Arnoldo Lenis, MD  lisinopril (PRINIVIL,ZESTRIL) 5 MG tablet Take 1 tablet (5 mg total) by mouth daily. 06/19/18 09/17/18  Arnoldo Lenis, MD  nitroGLYCERIN (NITROSTAT) 0.4 MG SL tablet Place 1 tablet (0.4 mg total) under the tongue every 5 (five) minutes as needed for chest pain. 02/13/18 02/13/19  Abigail Butts., PA-C  oxyCODONE-acetaminophen (PERCOCET/ROXICET) 5-325 MG tablet Take 1 tablet by mouth every 6 (six) hours as needed for moderate pain. 05/01/18   Dagoberto Ligas, PA-C  SPIRIVA HANDIHALER 18 MCG inhalation capsule INHALE 1 CAPSULE VIA HANDIHALER ONCE DAILY AT THE SAME TIME EVERY DAY Patient taking differently: Place 18 mcg into inhaler and inhale daily as needed (shortness of breath).  02/24/18   Arnoldo Lenis, MD    Review of Systems:  Unobtainable as pt is sedated and intubated  Physical Exam: Vitals:   08/28/2018 1154 09/03/2018 1157 08/30/2018 1200 09/03/2018 1215  BP:   (!) 167/107 (!) 165/112  Pulse: (!) 48 (!) 49 (!) 29 (!) 56  Resp: 18 18 18 18   SpO2: 100% 99% 100% 97%  Height:       General:  Intubated, sedated Head/Eye: No conjunctival hemorrhage, no icterus, [place]/AT, No nystagmus ENT:  No icterus,  No thrush, Neck:  No  masses, no lymphadenpathy, no bruits CV:  IRRR, no rub, no gallop, no S3 Lung:  Diminished breath sounds.  Bibasilar crackles, no wheeze Abdomen: soft/NT, +BS, nondistended, no peritoneal signs Ext: No cyanosis, No rashes, No petechiae, No lymphangitis, No edema Neuro: CNII-XII intact, strength 4/5 in bilateral upper and lower extremities, no dysmetria  Labs on Admission:  Basic Metabolic Panel: Recent Labs  Lab 08/21/2018 1109  NA 139  K 3.4*  CL 103  CO2 24  GLUCOSE 143*  BUN 21  CREATININE 0.97  CALCIUM 9.0   Liver Function Tests: Recent Labs  Lab 08/25/2018 1109  AST 21  ALT 13  ALKPHOS 88  BILITOT 0.7  PROT 7.0  ALBUMIN 3.1*   No results for input(s): LIPASE, AMYLASE in the last 168 hours. No results for input(s): AMMONIA in the last 168 hours. CBC: Recent Labs  Lab 09/03/2018 1109  WBC 10.1  NEUTROABS 6.6  HGB 12.2*  HCT 39.0  MCV 85.0  PLT 222   Coagulation Profile: Recent Labs  Lab 08/29/2018 1109  INR 1.17   Cardiac Enzymes: Recent Labs  Lab 08/27/2018 1109  TROPONINI <0.03   BNP: Invalid input(s): POCBNP CBG: No results for input(s): GLUCAP in the last 168 hours. Urine analysis:    Component Value Date/Time   COLORURINE STRAW (A) 09/03/2018 1104   APPEARANCEUR CLEAR 08/05/2018 1104   LABSPEC 1.009 08/13/2018 1104   PHURINE 7.0 08/23/2018 1104   GLUCOSEU NEGATIVE 08/05/2018 1104   HGBUR SMALL (A) 08/06/2018 1104   BILIRUBINUR NEGATIVE 09/01/2018 1104   [place] 08/13/2018 1104   PROTEINUR NEGATIVE 08/23/2018 1104   NITRITE NEGATIVE 08/28/2018 1104   LEUKOCYTESUR NEGATIVE 08/24/2018 1104   Sepsis Labs: @LABRCNTIP (procalcitonin:4,lacticidven:4) )No results found for this or any previous visit (from the past 240 hour(s)).   Radiological Exams on Admission: Dg Chest Portable 1 View  Result Date: 08/10/2018 CLINICAL DATA:  Intubation.  Intracranial hemorrhage. EXAM: PORTABLE CHEST 1 VIEW COMPARISON:  Chest x-ray dated 12/24/2017  FINDINGS: Endotracheal tube is 6 cm above the carina in good position. NG tube tip is below the diaphragm. Heart size and pulmonary vascularity are normal. There is chronic accentuation of the interstitial markings at the lung bases. No acute infiltrates or effusions. No acute bone abnormality. IMPRESSION: 1. Endotracheal tube and NG tube appear in good position. 2. Chronic interstitial disease at the lung bases. 3. No acute abnormalities. Electronically Signed   By: Lorriane Shire M.D.   On: 09/03/2018 11:59   Ct Head Code Stroke Wo Contrast  Result Date: 09/01/2018 CLINICAL DATA:  Code stroke. Unresponsiveness with headache and weakness EXAM: CT HEAD WITHOUT CONTRAST TECHNIQUE: Contiguous axial images were obtained from the base of the skull through the vertex without intravenous contrast. COMPARISON:  12/24/2017 FINDINGS: Brain: Large mixed density subdural hematoma along the right cerebral convexity, up to 3.5 cm in thickness. There is resultant midline shift of 2.4 cm and asymmetric dilatation of the left lateral ventricle. Thin subdural hematoma seen along the posterior falx, measuring up to 5 mm in thickness. Subarachnoid hemorrhage is seen in the interhemispheric fissure, along the left frontal convexity, and right sylvian fissure. Remote lateral lenticulostriate infarct on the left. There is uncal and subfalcine herniation without ACA or PCA territory infarct noted. Vascular: Negative Skull: No evident fracture. Sinuses/Orbits: No acute finding Other: Critical Value/emergent results were called by telephone at the time of interpretation on 09/02/2018 at 11:12 am to Dr. Elnora Morrison , who verbally acknowledged these results. IMPRESSION: 1. Large acute subdural hematoma along the right cerebral convexity measuring up to 3.5 cm thickness and causing subfalcine and uncal herniation with midline shift of 2.4 cm. 2. Patchy peripheral subarachnoid hemorrhage. Thin subdural hematoma along the posterior falx. 3.  Lateral left ventriculomegaly with a degree of entrapment based on prior head CT. Electronically Signed   By: Monte Fantasia M.D.   On: 08/14/2018 11:16    EKG: Independently reviewed.  Atrial fibrillation, nonspecific ST changes    Time spent:60 minutes Code Status:   Full comfort Family Communication:  Spouse and daughter updated at bedside Disposition Plan: in-hospital death Consults called: palliative med  DVT Prophylaxis: full comfort  Orson Eva, DO  [facility] Pager [phone]  If 7PM-7AM, please contact night-coverage [email or web address] Password [facility] 08/26/2018, 12:36 PM

## 2018-09-04 NOTE — Progress Notes (Signed)
Patient transported to ICU, RN at bedside. RT decreased FIO2 to 50%

## 2018-09-04 NOTE — ED Triage Notes (Signed)
Pt was home started to have a sudden headache, then he closaped with right side weakness and right sided facial droop.

## 2018-09-04 NOTE — Progress Notes (Signed)
No admission assessment completed due to patient being admitted on ventilator long enough for family to come and say good-byes and then compassionate extubation performed with comfort care.

## 2018-09-04 NOTE — Consult Note (Signed)
Consultation Note Date: 08/30/2018   Patient Name: Daniel Simon  DOB: 08-17-1934  MRN: [id number]  Age / Sex: 84 y.o., male  PCP: Dione Housekeeper, MD Referring Physician: Orson Eva, MD  Reason for Consultation: Establishing goals of care and Withdrawal of life-sustaining treatment  HPI/Patient Profile: 84 y.o. male  with past medical history of coronary artery disease, hyperlipidemia, stroke, aortic aneurysm, COPD admitted on 08/12/2018 with large subdural/subarachnoid hemorrhage with midline shift and herniation.  PMT consulted for end-of-life care.   Clinical Assessment and Goals of Care: Mr. Daniel Simon is resting quietly in bed intubated.  He does not open his eyes or try to interact with me in any way.  He appears comfortable.  Family is in the waiting room including wife [name] daughters [name] and [name], son [name] stepdaughter [name] brother [name] and his wife.  Family shares that they are waiting for other relatives to arrive.  We talked about comfort measures, compassionate extubation, in detail.  Adult children view images of head CT.  Family shares that everyone has arrived, they are ready for compassionate extubation.  Coordination with nursing staff and respiratory therapy, psychosocial support given.  Conference with hospitalist related to compassionate extubation, symptom management.   HC POA NEXT OF KIN    SUMMARY OF RECOMMENDATIONS   Compassionate extubation Full comfort care Anticipate Simon death  Code Status/Advance Care Planning:  DNR  Symptom Management:   Morphine continuous infusion  Ativan as needed  Palliative Prophylaxis:   Frequent Pain Assessment and Turn Reposition  Additional Recommendations (Limitations, Scope, Preferences):  Full Comfort Care  Psycho-social/Spiritual:   Desire for further Chaplaincy support:yes  Additional Recommendations:  Caregiving  Support/Resources and Grief/Bereavement Support  Prognosis:   Hours - Days  Discharge Planning: Anticipated Simon Death      Primary Diagnoses: Present on Admission: . Subdural hematoma (Daniel Simon) . Chronic combined systolic and diastolic heart failure (Daniel Simon) . Carotid stenosis, bilateral . HTN (hypertension) . Tobacco abuse . Ascending aortic aneurysm (Daniel Simon) . CAD (coronary artery disease), native coronary artery   I have reviewed the medical record, interviewed the patient and family, and examined the patient. The following aspects are pertinent.  Past Medical History:  Diagnosis Date  . CAD (coronary artery disease)    a. 02/2018: 3-vessel CAD by cath --> not felt to be a CABG candidate, therefore s/p DES to proximal LAD.   [facility] Cancer Daniel Simon)    prostate  . Carotid stenosis, right   . COPD (chronic obstructive pulmonary disease) (Daniel Simon)   . History of kidney stones   . Hyperlipidemia LDL goal <70   . Hypertension   . Myocardial infarction (Daniel Simon)   . Stroke Daniel Simon)    Social History   Socioeconomic History  . Marital status: Married    Spouse name: Not on file  . Number of children: Not on file  . Years of education: Not on file  . Highest education level: Not on file  Occupational History  . Not on file  Social Needs  . Financial  resource strain: Not on file  . Food insecurity:    Worry: Patient refused    Inability: Patient refused  . Transportation needs:    Medical: Patient refused    Non-medical: Patient refused  Tobacco Use  . Smoking status: Current Every Day Smoker    Packs/day: 1.00    Types: Cigarettes    Start date: 09/24/1948  . Smokeless tobacco: Never Used  Substance and Sexual Activity  . Alcohol use: Not Currently    Frequency: Never  . Drug use: Never  . Sexual activity: Not Currently  Lifestyle  . Physical activity:    Days per week: 0 days    Minutes per session: 0 min  . Stress: Only a little  Relationships  . Social  connections:    Talks on phone: Patient refused    Gets together: Patient refused    Attends religious service: Patient refused    Active member of club or organization: Patient refused    Attends meetings of clubs or organizations: Patient refused    Relationship status: Patient refused  Other Topics Concern  . Not on file  Social History Narrative  . Not on file   Family History  Problem Relation Age of Onset  . Cancer Sister   . Cancer Sister   . Heart attack Brother   . Heart disease Brother    Scheduled Meds: Continuous Infusions: . niCARDipine     PRN Meds:. Medications Prior to Admission:  Prior to Admission medications   Medication Sig Start Date End Date Taking? Authorizing Provider  albuterol (PROVENTIL HFA;VENTOLIN HFA) 108 (90 Base) MCG/ACT inhaler Inhale 2 puffs into the lungs every 6 (six) hours as needed for wheezing or shortness of breath. 12/26/17  Yes Kathie Dike, MD  aspirin EC 81 MG tablet Take 81 mg by mouth daily.   Yes [provider]  atorvastatin (LIPITOR) 80 MG tablet Take 1 tablet (80 mg total) by mouth daily at 6 PM. 02/13/18  Yes Kroeger, Daleen Snook M., PA-C  carvedilol (COREG) 6.25 MG tablet TAKE 1 TABLET (6.25 MG TOTAL) BY MOUTH 2 (TWO) TIMES DAILY WITH A MEAL. 04/27/18  Yes Strader, Fransisco Hertz, PA-C  clopidogrel (PLAVIX) 75 MG tablet Take 1 tablet (75 mg total) by mouth daily with breakfast. 02/13/18  Yes Kroeger, Daleen Snook M., PA-C  furosemide (LASIX) 40 MG tablet Take 1 tablet (40 mg total) by mouth daily. 12/26/17 12/26/18 Yes Kathie Dike, MD  isosorbide mononitrate (IMDUR) 30 MG 24 hr tablet Take 1 tablet (30 mg total) by mouth daily. 02/13/18  Yes Kroeger, Daleen Snook M., PA-C  KLOR-CON M20 20 MEQ tablet TAKE 1 TABLET BY MOUTH ONCE DAILY 06/22/18  Yes Branch, Alphonse Guild, MD  lisinopril (PRINIVIL,ZESTRIL) 5 MG tablet Take 1 tablet (5 mg total) by mouth daily. 06/19/18 09/17/18 Yes Branch, Alphonse Guild, MD  nitroGLYCERIN (NITROSTAT) 0.4 MG SL tablet  Place 1 tablet (0.4 mg total) under the tongue every 5 (five) minutes as needed for chest pain. 02/13/18 02/13/19 Yes Kroeger, Lorelee Cover., PA-C  oxyCODONE-acetaminophen (PERCOCET/ROXICET) 5-325 MG tablet Take 1 tablet by mouth every 6 (six) hours as needed for moderate pain. 05/01/18  Yes Dagoberto Ligas, PA-C  SPIRIVA HANDIHALER 18 MCG inhalation capsule INHALE 1 CAPSULE VIA HANDIHALER ONCE DAILY AT THE SAME TIME EVERY DAY Patient taking differently: Place 18 mcg into inhaler and inhale daily as needed (shortness of breath).  02/24/18  Yes Branch, Alphonse Guild, MD   No Known Allergies Review of Systems  Unable to perform  ROS: Intubated    Physical Exam Vitals signs and nursing note reviewed.  Cardiovascular:     Rate and Rhythm: Normal rate.  Pulmonary:     Comments: Intubated/ventilated Neurological:     Comments: Known subdural hematoma with midline shift     Vital Signs: BP (!) 196/103   Pulse (!) 56   Resp 18   Ht 5\' 7"  (1.702 m)   SpO2 97%   BMI 21.14 kg/m  Pain Scale: CPOT       SpO2: SpO2: 97 % O2 Device:SpO2: 97 % O2 Flow Rate: .   IO: Intake/output summary: No intake or output data in the 24 hours ending 08/22/2018 1336  LBM:   Baseline Weight:   Most recent weight:       Palliative Assessment/Data:   Flowsheet Rows     Most Recent Value  Intake Tab  Referral Department  Hospitalist  Unit at Time of Referral  ICU  Palliative Care Primary Diagnosis  Neurology  Date Notified  08/18/2018  Palliative Care Type  New Palliative care  Reason for referral  Psychosocial or Spiritual support, End of Life Care Assistance  Date of Admission  08/12/2018  Date first seen by Palliative Care  08/14/2018  # of days Palliative referral response time  0 Day(s)  # of days IP prior to Palliative referral  0  Clinical Assessment  Palliative Performance Scale Score  10%  Pain Max last 24 hours  Not able to report  Pain Min Last 24 hours  Not able to report  Dyspnea Max Last 24  Hours  Not able to report  Dyspnea Min Last 24 hours  Not able to report  Psychosocial & Spiritual Assessment  Palliative Care Outcomes  Patient/Family meeting held?  Yes  Who was at the meeting?  Family at bedside      Time In: 1310 Time Out: 1440 Time Total: 90 minutes Greater than 50%  of this time was spent counseling and coordinating care related to the above assessment and plan.  Signed by: Drue [place], NP   Please contact Palliative Medicine Team phone at [phone] for questions and concerns.  For individual provider: See Shea Evans

## 2018-09-04 NOTE — ED Notes (Signed)
1130 pt intubated with 7.5 et tube

## 2018-09-04 NOTE — Progress Notes (Signed)
Visited with patient, wife, children and host of family members. Family expressed concern for spiritual support and prayer for peace for patient. I listened supportively as wife begin life review of wonderful 38 years of marriage. Offered prayer per family request.

## 2018-09-05 DIAGNOSIS — Z7189 Other specified counseling: Secondary | ICD-10-CM

## 2018-09-05 DIAGNOSIS — Z515 Encounter for palliative care: Secondary | ICD-10-CM

## 2018-09-05 DIAGNOSIS — Z720 Tobacco use: Secondary | ICD-10-CM

## 2018-09-05 MED ORDER — ORAL CARE MOUTH RINSE
15.0000 mL | Freq: Two times a day (BID) | OROMUCOSAL | Status: DC
  Administered 2018-09-05 – 2018-09-06 (×2): 15 mL via OROMUCOSAL

## 2018-09-05 NOTE — Progress Notes (Signed)
Patient transferred to medical surgical unit. Report given to [place], [place]

## 2018-09-05 NOTE — Progress Notes (Signed)
PROGRESS NOTE  Daniel Simon WNI:[id number] DOB: 1935/01/29 DOA: 08/17/2018 PCP: Dione Housekeeper, MD  Brief History:   84 y.o. male with medical history of coronary artery disease, hyperlipidemia, stroke, aortic aneurysm, COPD, and hypertension presenting with unresponsiveness.  The patient had been in his usual state of health until this morning around 10 AM when he and his wife came back home after going to the grocery store.  They were in the process of bringing the groceries back into the house when the patient fell onto his left side in the yard.  The patient's wife dragged the patient back into the house and placed him on the couch.  She noted that the patient had left-sided weakness.  She also stated that he had started complaining of a headache as she was bringing him back into the house.  After the patient was placed on the couch, he became unresponsive.  EMS was activated.  The patient remained unresponsive upon EMS arrival.  In the emergency department, the patient was intubated.  CT of the brain showed a large acute subdural hemorrhage in the right cerebral convexity measuring 3.5 cm in thickness causing subfalcine and uncal herniation with 2.4 cm midline shift.  There is also patchy peripheral subarachnoid hemorrhage.  EDP spoke with neurosurgery, Dr. Trenton Gammon who felt that there was no chance for functional outcome/recover given the patients age and co-morbidities.  In the emergency department, the patient was afebrile and hemodynamically stable.  BMP was unremarkable except for potassium 3.4.  LFTs and CBC were essentially unremarkable.  Chest x-ray showed chronic residual changes and bibasilar atelectasis.  Palliative medicine was consulted to assist with goals of care and transition to end of life care.  Assessment/Plan: Subdural hemorrhage/subarachnoid hemorrhage -According to neurosurgery, no chance for functional outcome/recover given the patients age and co-morbidities in the  setting of current findings -This was discussed with the patient's spouse and daughter at the bedside -Family felt that the patient would not want to remain on life support given the circumstances, and they wanted the patient to remain comfortable and maintain his dignity. -As result, the patient will be admitted to the ICU to allow his family to come visit -Family agrees to focus the patient's care to maintain comfort and dignity, and they do not want any heroic measures -Palliative medicine was consulted to assist with symptom management and transition to end of life care  Acute respiratory failure with hypoxia -Secondary to hypoventilation from the patient's catastrophic intracranial event -Patient currently intubated -Palliative medicine consulted -Plan for compassionate extubation  Atrial fibrillation with RVR, type unspecified -As the patient's focus of care will now remain on his full comfort and dignity, this will no longer be an active issue at this point  Chronic systolic and diastolic CHF -Clinically euvolemic -12/25/2017 echo EF 45-50%, grade 1 DD -No longer active issue as the patient's focus of care will remain on his comfort and dignity  Coronary artery disease -Status post DES to the LAD July 2019 -No longer active issue as the patient's focus of care will remain on his comfort and dignity  Hyperlipidemia -No longer active issue as the patient's focus of care will remain on his comfort and dignity  COPD -No longer active issue as the patient's focus of care will remain on his comfort and dignity  Essential hypertension -No longer active issue as the patient's focus of care will remain on his comfort and dignity  Ascending  Aortic Aneurysm -No longer active issue as the patient's focus of care will remain on his comfort and dignity  Goals of Care -palliative medicine was consulted -family wishes for comfort and dignified death -full comfort -Advance care  planning, including the explanation and discussion of advance directives was carried out with the patient and family.  Code status including explanations of "Full Code" and "DNR" and alternatives were discussed in detail.  Discussion of end-of-life issues including but not limited palliative care, hospice care and the concept of hospice, other end-of-life care options, power of attorney for health care decisions, living wills, and physician orders for life-sustaining treatment were also discussed with the patient and family.  Total face to face time 16 minutes.        Disposition Plan:   In-hospital death  Family Communication:   Family at bedside updated 2/1  Consultants:  Palliative med  Code Status:  FULL COMFORT  DVT Prophylaxis:   FULL COMFORT   Procedures: As Listed in Progress Note Above  Antibiotics: None      Subjective: Pt has some spinal cord reflexes and occasional myoclonus.  No reports of respiratory distress, uncontrolled pain, vomiting, diarrhea.  Family at bedside comforting patient  Objective: Vitals:   08/21/2018 1400 08/21/2018 1600 08/07/2018 1700 09/05/18 0008  BP:   (!) 195/94 (!) 183/74  Pulse: 85 86 84 97  Resp: (!) 23 18 20 20   Temp:    (!) 100.4 F (38 C)  TempSrc:    Oral  SpO2: 91% 95% (!) 89% (!) 88%  Height:        Intake/Output Summary (Last 24 hours) at 09/05/2018 1647 Last data filed at 09/05/2018 0400 Gross per 24 hour  Intake 24.13 ml  Output 900 ml  Net -875.87 ml   Weight change:  Exam:   General:  Pt is resting peacefully  HEENT: No icterus,  [place]/AT  Cardiovascular: RRR, S1/S2  Respiratory: bibasilar rales.  Diminished breath sounds bilateral  Abdomen: Soft/+BS,  non distended, no guarding  Extremities: No edema, No lymphangitis, No petechiae,    Data Reviewed: I have personally reviewed following labs and imaging studies Basic Metabolic Panel: Recent Labs  Lab 08/26/2018 1109  NA 139  K 3.4*  CL 103  CO2 24    GLUCOSE 143*  BUN 21  CREATININE 0.97  CALCIUM 9.0   Liver Function Tests: Recent Labs  Lab 08/17/2018 1109  AST 21  ALT 13  ALKPHOS 88  BILITOT 0.7  PROT 7.0  ALBUMIN 3.1*   No results for input(s): LIPASE, AMYLASE in the last 168 hours. No results for input(s): AMMONIA in the last 168 hours. Coagulation Profile: Recent Labs  Lab 08/24/2018 1109  INR 1.17   CBC: Recent Labs  Lab 08/22/2018 1109  WBC 10.1  NEUTROABS 6.6  HGB 12.2*  HCT 39.0  MCV 85.0  PLT 222   Cardiac Enzymes: Recent Labs  Lab 08/06/2018 1109  TROPONINI <0.03   BNP: Invalid input(s): POCBNP CBG: No results for input(s): GLUCAP in the last 168 hours. HbA1C: No results for input(s): HGBA1C in the last 72 hours. Urine analysis:    Component Value Date/Time   COLORURINE STRAW (A) 08/26/2018 1104   APPEARANCEUR CLEAR 08/06/2018 1104   LABSPEC 1.009 08/09/2018 1104   PHURINE 7.0 08/12/2018 1104   GLUCOSEU NEGATIVE 08/15/2018 1104   [place] (A) 08/23/2018 1104   BILIRUBINUR NEGATIVE 08/21/2018 [place] 08/09/2018 [place] 08/12/2018 1104  NITRITE NEGATIVE 08/31/2018 1104   LEUKOCYTESUR NEGATIVE 09/02/2018 1104   Sepsis Labs: @LABRCNTIP (procalcitonin:4,lacticidven:4) )No results found for this or any previous visit (from the past 240 hour(s)).   Scheduled Meds: . mouth rinse  15 mL Mouth Rinse BID   Continuous Infusions: . morphine 2 mg/hr (08/16/2018 1556)  . niCARDipine      Procedures/Studies: Dg Chest Portable 1 View  Result Date: 08/09/2018 CLINICAL DATA:  Intubation.  Intracranial hemorrhage. EXAM: PORTABLE CHEST 1 VIEW COMPARISON:  Chest x-ray dated 12/24/2017 FINDINGS: Endotracheal tube is 6 cm above the carina in good position. NG tube tip is below the diaphragm. Heart size and pulmonary vascularity are normal. There is chronic accentuation of the interstitial markings at the lung bases. No acute infiltrates or effusions. No acute bone  abnormality. IMPRESSION: 1. Endotracheal tube and NG tube appear in good position. 2. Chronic interstitial disease at the lung bases. 3. No acute abnormalities. Electronically Signed   By: Lorriane Shire M.D.   On: 08/15/2018 11:59   Ct Head Code Stroke Wo Contrast  Result Date: 09/03/2018 CLINICAL DATA:  Code stroke. Unresponsiveness with headache and weakness EXAM: CT HEAD WITHOUT CONTRAST TECHNIQUE: Contiguous axial images were obtained from the base of the skull through the vertex without intravenous contrast. COMPARISON:  12/24/2017 FINDINGS: Brain: Large mixed density subdural hematoma along the right cerebral convexity, up to 3.5 cm in thickness. There is resultant midline shift of 2.4 cm and asymmetric dilatation of the left lateral ventricle. Thin subdural hematoma seen along the posterior falx, measuring up to 5 mm in thickness. Subarachnoid hemorrhage is seen in the interhemispheric fissure, along the left frontal convexity, and right sylvian fissure. Remote lateral lenticulostriate infarct on the left. There is uncal and subfalcine herniation without ACA or PCA territory infarct noted. Vascular: Negative Skull: No evident fracture. Sinuses/Orbits: No acute finding Other: Critical Value/emergent results were called by telephone at the time of interpretation on 08/21/2018 at 11:12 am to Dr. Elnora Morrison , who verbally acknowledged these results. IMPRESSION: 1. Large acute subdural hematoma along the right cerebral convexity measuring up to 3.5 cm thickness and causing subfalcine and uncal herniation with midline shift of 2.4 cm. 2. Patchy peripheral subarachnoid hemorrhage. Thin subdural hematoma along the posterior falx. 3. Lateral left ventriculomegaly with a degree of entrapment based on prior head CT. Electronically Signed   By: Monte Fantasia M.D.   On: 08/05/2018 11:16    Orson Eva, DO  [facility] Pager [phone]  If 7PM-7AM, please contact  night-coverage [email or web address] Password [email or web address] 09/05/2018, 4:47 PM   LOS: 1 day

## 2018-09-05 DEATH — deceased

## 2018-09-06 NOTE — Progress Notes (Signed)
PROGRESS NOTE  Daniel Simon PTW:[id number] DOB: 06/21/1935 DOA: 08/23/2018 PCP: Dione Housekeeper, MD  Brief History:  84 y.o.malewith medical history ofcoronary artery disease, hyperlipidemia, stroke, aortic aneurysm, COPD, and hypertension presenting with unresponsiveness. The patient had been in his usual state of health until this morning around 10 AM when he and his wife came back home after going to the grocery store. They were in the process of bringing the groceries back into the house when the patient fell onto his left side in the yard. The patient's wife dragged the patient back into the house and placed him on the couch. She noted that the patient had left-sided weakness. She also stated that he had started complaining of a headache as she was bringing him back into the house. After the patient was placed on the couch, he became unresponsive. EMS was activated. The patient remained unresponsive upon EMS arrival. In the emergency department, the patient was intubated. CT of the brain showed a large acute subdural hemorrhage in the right cerebral convexity measuring 3.5 cm in thickness causing subfalcine and uncal herniation with 2.4 cm midline shift. There is also patchy peripheral subarachnoid hemorrhage. EDP spoke with neurosurgery, Dr. Trenton Gammon who felt that there was no chance for functional outcome/recover given the patients age and co-morbidities. In the emergency department, the patient was afebrile and hemodynamically stable. BMP was unremarkable except for potassium 3.4. LFTs and CBC were essentially unremarkable. Chest x-ray showed chronic residual changes and bibasilar atelectasis. Palliative medicine was consulted to assist with goals of care and transition to end of life care.  Assessment/Plan: Subdural hemorrhage/subarachnoid hemorrhage -According to neurosurgery, no chance for functional outcome/recover given the patients age and co-morbidities in  the setting of current findings -This was discussed with the patient's spouse and daughter at the bedside -Family felt that the patient would not want to remain on life support given the circumstances, and they wanted the patient to remain comfortable and maintain his dignity. -As result, the patient will be admitted to the ICU to allow his family to come visit -Family agrees to focus the patient's care to maintain comfort and dignity, and they do not want any heroic measures -Palliative medicine was consulted to assist with symptom management and transition to end of life care  Acute respiratory failure with hypoxia -Secondary to hypoventilation from the patient's catastrophic intracranial event -Patient currently intubated -Palliative medicine consulted -08/26/2018-compassionate extubation  Atrial fibrillation with RVR, type unspecified -As the patient's focus of care will now remain on his full comfort and dignity, this will no longer be an active issue at this point  Chronic systolic and diastolic CHF -Clinically euvolemic -12/25/2017 echo EF 45-50%, grade 1 DD -No longer active issue as the patient's focus of care will remain on his comfort and dignity  Coronary artery disease -Status post DES to the LAD July 2019 -No longer active issue as the patient's focus of care will remain on his comfort and dignity  Hyperlipidemia -No longer active issue as the patient's focus of care will remain on his comfort and dignity  COPD -No longer active issue as the patient's focus of care will remain on his comfort and dignity  Essential hypertension -No longer active issue as the patient's focus of care will remain on his comfort and dignity  Ascending Aortic Aneurysm -No longer active issue as the patient's focus of care will remain on his comfort and dignity  Goals of Care -  palliative medicine was consulted -family wishes for comfort and dignified death -full comfort -Advance  care planning, including the explanation and discussion of advance directives was carried out with the patient and family.  Code status including explanations of "Full Code" and "DNR" and alternatives were discussed in detail.  Discussion of end-of-life issues including but not limited palliative care, hospice care and the concept of hospice, other end-of-life care options, power of attorney for health care decisions, living wills, and physician orders for life-sustaining treatment were also discussed with the patient and family.  Total face to face time 16 minutes.        Disposition Plan:   In-hospital death  Family Communication:   Family at bedside updated 2/1  Consultants:  Palliative med  Code Status:  FULL COMFORT  DVT Prophylaxis:   FULL COMFORT   Procedures: As Listed in Progress Note Above  Antibiotics: None      Subjective: Pt showing cheyne-stokes respirations.  No respiratory distress.  No vomiting.  No uncontrolled pain.  No diarrhea.  Resting with eyes closed  Objective: Vitals:   09/03/2018 1600 08/05/2018 1700 09/05/18 0008 09/05/18 1700  BP:  (!) 195/94 (!) 183/74 (!) 157/91  Pulse: 86 84 97 (!) 137  Resp: 18 20 20  (!) 23  Temp:   (!) 100.4 F (38 C) (!) 100.7 F (38.2 C)  TempSrc:   Oral Oral  SpO2: 95% (!) 89% (!) 88% (!) 89%  Height:        Intake/Output Summary (Last 24 hours) at 09/06/2018 1602 Last data filed at 09/06/2018 0500 Gross per 24 hour  Intake 0 ml  Output 800 ml  Net -800 ml   Weight change:  Exam:   General:  Pt is resting peacefully.  Does not open eyes  HEENT: No icterus, [place]/AT  Cardiovascular: IRRR, S1/S2, no rubs, no gallops  Respiratory: poor inspiratory effort.  Bibasilar rales  Abdomen: Soft/+BS, non distended, no guarding  Extremities: No edema, No lymphangitis, No petechiae, No rashes, no synovitis   Data Reviewed: I have personally reviewed following labs and imaging studies Basic Metabolic  Panel: Recent Labs  Lab 08/26/2018 1109  NA 139  K 3.4*  CL 103  CO2 24  GLUCOSE 143*  BUN 21  CREATININE 0.97  CALCIUM 9.0   Liver Function Tests: Recent Labs  Lab 08/19/2018 1109  AST 21  ALT 13  ALKPHOS 88  BILITOT 0.7  PROT 7.0  ALBUMIN 3.1*   No results for input(s): LIPASE, AMYLASE in the last 168 hours. No results for input(s): AMMONIA in the last 168 hours. Coagulation Profile: Recent Labs  Lab 08/16/2018 1109  INR 1.17   CBC: Recent Labs  Lab 08/18/2018 1109  WBC 10.1  NEUTROABS 6.6  HGB 12.2*  HCT 39.0  MCV 85.0  PLT 222   Cardiac Enzymes: Recent Labs  Lab 08/10/2018 1109  TROPONINI <0.03   BNP: Invalid input(s): POCBNP CBG: No results for input(s): GLUCAP in the last 168 hours. HbA1C: No results for input(s): HGBA1C in the last 72 hours. Urine analysis:    Component Value Date/Time   COLORURINE STRAW (A) 08/11/2018 1104   APPEARANCEUR CLEAR 08/14/2018 1104   LABSPEC 1.009 08/09/2018 1104   PHURINE 7.0 08/11/2018 1104   GLUCOSEU NEGATIVE 08/26/2018 1104   HGBUR SMALL (A) 09/03/2018 1104   BILIRUBINUR NEGATIVE 08/23/2018 1104   [place] 08/07/2018 1104   PROTEINUR NEGATIVE 08/07/2018 1104   NITRITE NEGATIVE 08/29/2018 1104   LEUKOCYTESUR NEGATIVE 08/26/2018  1104   Sepsis Labs: @LABRCNTIP (procalcitonin:4,lacticidven:4) )No results found for this or any previous visit (from the past 240 hour(s)).   Scheduled Meds: . mouth rinse  15 mL Mouth Rinse BID   Continuous Infusions: . morphine 2 mg/hr (09/06/18 0816)  . niCARDipine      Procedures/Studies: Dg Chest Portable 1 View  Result Date: 08/13/2018 CLINICAL DATA:  Intubation.  Intracranial hemorrhage. EXAM: PORTABLE CHEST 1 VIEW COMPARISON:  Chest x-ray dated 12/24/2017 FINDINGS: Endotracheal tube is 6 cm above the carina in good position. NG tube tip is below the diaphragm. Heart size and pulmonary vascularity are normal. There is chronic accentuation of the interstitial  markings at the lung bases. No acute infiltrates or effusions. No acute bone abnormality. IMPRESSION: 1. Endotracheal tube and NG tube appear in good position. 2. Chronic interstitial disease at the lung bases. 3. No acute abnormalities. Electronically Signed   By: Lorriane Shire M.D.   On: 08/11/2018 11:59   Ct Head Code Stroke Wo Contrast  Result Date: 08/26/2018 CLINICAL DATA:  Code stroke. Unresponsiveness with headache and weakness EXAM: CT HEAD WITHOUT CONTRAST TECHNIQUE: Contiguous axial images were obtained from the base of the skull through the vertex without intravenous contrast. COMPARISON:  12/24/2017 FINDINGS: Brain: Large mixed density subdural hematoma along the right cerebral convexity, up to 3.5 cm in thickness. There is resultant midline shift of 2.4 cm and asymmetric dilatation of the left lateral ventricle. Thin subdural hematoma seen along the posterior falx, measuring up to 5 mm in thickness. Subarachnoid hemorrhage is seen in the interhemispheric fissure, along the left frontal convexity, and right sylvian fissure. Remote lateral lenticulostriate infarct on the left. There is uncal and subfalcine herniation without ACA or PCA territory infarct noted. Vascular: Negative Skull: No evident fracture. Sinuses/Orbits: No acute finding Other: Critical Value/emergent results were called by telephone at the time of interpretation on 08/07/2018 at 11:12 am to Dr. Elnora Morrison , who verbally acknowledged these results. IMPRESSION: 1. Large acute subdural hematoma along the right cerebral convexity measuring up to 3.5 cm thickness and causing subfalcine and uncal herniation with midline shift of 2.4 cm. 2. Patchy peripheral subarachnoid hemorrhage. Thin subdural hematoma along the posterior falx. 3. Lateral left ventriculomegaly with a degree of entrapment based on prior head CT. Electronically Signed   By: Monte Fantasia M.D.   On: 08/23/2018 11:16    Orson Eva, DO  [facility] Pager  [phone]  If 7PM-7AM, please contact night-coverage [email or web address] Password [email or web address] 09/06/2018, 4:02 PM   LOS: 2 days

## 2018-10-04 NOTE — Progress Notes (Signed)
Called to patient room by family, patient found to have no respirations and no pulse verified by second RN, Donavan Foil. Wife and family is at bedside. Dr. Carles Collet made aware along with [facility]. Appropriate measures are taken. Will continue to provide support for family.

## 2018-10-04 NOTE — Clinical Social Work Note (Signed)
Per attending request, referral was made to [facility].     Jessia Kief, Clydene Pugh, LCSW

## 2018-10-04 NOTE — Care Management Important Message (Signed)
Important Message  Patient Details  Name: Daniel Simon MRN: [id number] Date of Birth: 08/31/1934   Medicare Important Message Given:  Yes    [name] Sep 24, 2018, 11:24 AM

## 2018-10-04 NOTE — Clinical Social Work Note (Signed)
Jaben, Benegas #[id number] (CSN: [id number]) (84 y.o. M) (Adm: 08/11/2018)  [id number]  PCP   Edrick Oh, LEONARD  Demographics  Comment    Last edited by  on at   Address: Home Phone: Work Futures trader:  [address] [place] [address]   [phone] -- [phone]  SSN: Insurance: Marital Status: Religion:  [id number] MEDICARE Married [place]  Patient Ethnicity & Race   Ethnic Group Patient Race  Not Hispanic or Latino White or Caucasian  Documents Filed to Patient   Power of Attorney Living Will Clinical Unknown Study Attachment Consent Form ABN Waiver After Visit Summary Lab Result Scan Code Status MyChart Status Advance Care Planning  Not on File Not on File Not on File Not on File Filed Not on [place] DNR [Updated on 08/12/2018 1357] Code Exp Jump to the Activity  Admission Information   Current Information   Attending Provider Admitting Provider Admission Type Admission Status  Tat, Shanon Brow, MD Orson Eva, MD Emergency Admission (Confirmed)       Admission Date/Time Discharge Date Hospital Service Auth/Cert Status  72/53/66 10:59 AM  Internal Medicine [facility] Room/Bed   [facility] AP-DEPT 300 [id number]        Hospital Account   Name Acct ID Class Status Primary Coverage  Tee, Richeson [id number] Inpatient Open MEDICARE - MEDICARE PART A AND B      Guarantor Account (for Hospital Account [id number])   Name Relation to Pt Service Area Active? Acct Type  Bretta Bang Self CHSA Yes Personal/Family  Address Phone    [place], [place] [address] [phone])        Coverage Information (for Hospital Account [id number])   1. [employer] PART A AND B   F/O Payor/Plan Precert #  MEDICARE/MEDICARE PART A AND B   Subscriber Subscriber #  Demetrio, Leighty [id number]  Address Phone  [address] [place], [place] [address]   2. [employer] [place] OF [facility] MCR  SUP   F/O Payor/Plan Precert #  MUTUAL OF [facility] OF [facility] SUP   Subscriber Subscriber #  Kelden, Lavallee [id number]  Address Phone  [address] [place] [address] [phone]

## 2018-10-04 NOTE — Death Summary Note (Signed)
DEATH SUMMARY   Patient Details  Name: Daniel Simon MRN: [id number] DOB: 1935/01/19  Admission/Discharge Information   Admit Date:  Sep 08, 2018  Date of Death: Date of Death: 2018/09/11  Time of Death: Time of Death: 01-Nov-1399  Length of Stay: 3  Referring Physician: Dione Housekeeper, MD   Reason(s) for Hospitalization  unresponsiveness  Diagnoses  Preliminary cause of death: subdural and subarachnoid hemorrhage Secondary Diagnoses (including complications and co-morbidities):  Subdural hemorrhage/subarachnoid hemorrhage -According to neurosurgery, nochance for functional outcome/recover given the patients age and co-morbidities in the setting of current findings -This was discussed with the patient's spouse and daughter at the bedside -Family felt that the patient would not want to remain on life supportgiven the circumstances, and they wanted the patient to remain comfortable and maintain his dignity. -As result, the patient will be admitted to the ICU to allow his family to come visit -Family agrees to focus the patient's care to maintain comfort and dignity, and they do not want any heroic measures -Palliative medicine was consulted to assist with symptom managementand transition to end of life care  Acute respiratory failure with hypoxia -Secondary to hypoventilation from the patient's catastrophic intracranial event -Patient currently intubated -Palliative medicine consulted -Sep 08, 2018-compassionate extubation  Atrial fibrillation with RVR, type unspecified -As the patient's focus of care will now remain on his full comfort and dignity, this will no longer be an active issue at this point  Chronic systolic and diastolic CHF -Clinically euvolemic -12/25/2017 echo EF 45-50%, grade 1 DD -No longer active issue as the patient's focus of care will remain on his comfort and dignity  Coronary artery disease -Status post DES to the LAD July 2019 -No longer active issue as the  patient's focus of care will remain on his comfort and dignity  Hyperlipidemia -No longer active issue as the patient's focus of care will remain on his comfort and dignity  COPD -No longer active issue as the patient's focus of care will remain on his comfort and dignity  Essential hypertension -No longer active issue as the patient's focus of care will remain on his comfort and dignity  Ascending Aortic Aneurysm -No longer active issue as the patient's focus of care will remain on his comfort and dignity  Goals of Care -palliative medicine was consulted -family wishes for comfort and dignified death -full comfort -Advance care planning, including the explanation and discussion of advance directives was carried out with the patient and family. Code status including explanations of "Full Code" and "DNR" and alternatives were discussed in detail. Discussion of end-of-life issues including but not limited palliative care, hospice care and the concept of hospice, other end-of-life care options, power of attorney for health care decisions, living wills, and physician orders for life-sustaining treatment were also discussed with the patient and family.    Brief Hospital Course (including significant findings, care, treatment, and services provided and events leading to death)  Daniel Simon is a 84 y.o.malewith medical history ofcoronary artery disease, hyperlipidemia, stroke, aortic aneurysm, COPD, and hypertension presenting with unresponsiveness. The patient had been in his usual state of health until this morning around 10 AM when he and his wife came back home after going to the grocery store. They were in the process of bringing the groceries back into the house when the patient fell onto his left side in the yard. The patient's wife dragged the patient back into the house and placed him on the couch. She noted that the patient had left-sided  weakness. She also stated that  he had started complaining of a headache as she was bringing him back into the house. After the patient was placed on the couch, he became unresponsive. EMS was activated. The patient remained unresponsive upon EMS arrival. In the emergency department, the patient was intubated. CT of the brain showed a large acute subdural hemorrhage in the right cerebral convexity measuring 3.5 cm in thickness causing subfalcine and uncal herniation with 2.4 cm midline shift. There is also patchy peripheral subarachnoid hemorrhage. EDP spoke with neurosurgery, Dr. Trenton Gammon who felt that therewas no chance for functional outcome/recover given the patients age and co-morbidities. In the emergency department, the patient was afebrile and hemodynamically stable. BMP was unremarkable except for potassium 3.4. LFTs and CBC were essentially unremarkable. Chest x-ray showed chronic residual changes and bibasilar atelectasis. Palliative medicine was consulted to assist withgoals of care and transition to end of life care.     Pertinent Labs and Studies  Significant Diagnostic Studies Dg Chest Portable 1 View  Result Date: 08/09/2018 CLINICAL DATA:  Intubation.  Intracranial hemorrhage. EXAM: PORTABLE CHEST 1 VIEW COMPARISON:  Chest x-ray dated 12/24/2017 FINDINGS: Endotracheal tube is 6 cm above the carina in good position. NG tube tip is below the diaphragm. Heart size and pulmonary vascularity are normal. There is chronic accentuation of the interstitial markings at the lung bases. No acute infiltrates or effusions. No acute bone abnormality. IMPRESSION: 1. Endotracheal tube and NG tube appear in good position. 2. Chronic interstitial disease at the lung bases. 3. No acute abnormalities. Electronically Signed   By: Lorriane Shire M.D.   On: 08/18/2018 11:59   Ct Head Code Stroke Wo Contrast  Result Date: 08/09/2018 CLINICAL DATA:  Code stroke. Unresponsiveness with headache and weakness EXAM: CT HEAD WITHOUT  CONTRAST TECHNIQUE: Contiguous axial images were obtained from the base of the skull through the vertex without intravenous contrast. COMPARISON:  12/24/2017 FINDINGS: Brain: Large mixed density subdural hematoma along the right cerebral convexity, up to 3.5 cm in thickness. There is resultant midline shift of 2.4 cm and asymmetric dilatation of the left lateral ventricle. Thin subdural hematoma seen along the posterior falx, measuring up to 5 mm in thickness. Subarachnoid hemorrhage is seen in the interhemispheric fissure, along the left frontal convexity, and right sylvian fissure. Remote lateral lenticulostriate infarct on the left. There is uncal and subfalcine herniation without ACA or PCA territory infarct noted. Vascular: Negative Skull: No evident fracture. Sinuses/Orbits: No acute finding Other: Critical Value/emergent results were called by telephone at the time of interpretation on 08/29/2018 at 11:12 am to Dr. Elnora Morrison , who verbally acknowledged these results. IMPRESSION: 1. Large acute subdural hematoma along the right cerebral convexity measuring up to 3.5 cm thickness and causing subfalcine and uncal herniation with midline shift of 2.4 cm. 2. Patchy peripheral subarachnoid hemorrhage. Thin subdural hematoma along the posterior falx. 3. Lateral left ventriculomegaly with a degree of entrapment based on prior head CT. Electronically Signed   By: Monte Fantasia M.D.   On: 08/23/2018 11:16    Microbiology No results found for this or any previous visit (from the past 240 hour(s)).  Lab Basic Metabolic Panel: Recent Labs  Lab 08/15/2018 1109  NA 139  K 3.4*  CL 103  CO2 24  GLUCOSE 143*  BUN 21  CREATININE 0.97  CALCIUM 9.0   Liver Function Tests: Recent Labs  Lab 08/06/2018 1109  AST 21  ALT 13  ALKPHOS 88  BILITOT 0.7  PROT 7.0  ALBUMIN 3.1*   No results for input(s): LIPASE, AMYLASE in the last 168 hours. No results for input(s): AMMONIA in the last 168  hours. CBC: Recent Labs  Lab 08/21/2018 1109  WBC 10.1  NEUTROABS 6.6  HGB 12.2*  HCT 39.0  MCV 85.0  PLT 222   Cardiac Enzymes: Recent Labs  Lab 08/22/2018 1109  TROPONINI <0.03   Sepsis Labs: Recent Labs  Lab 08/29/2018 1109  WBC 10.1    Procedures/Operations  none   Raseel Jans Sep 20, 2018, 3:12 PM

## 2018-10-04 NOTE — Plan of Care (Signed)
Patient is not making progress towards goals due to condition of health and reaching end of life.  P.J. Linus Mako, RN

## 2018-10-04 DEATH — deceased

## 2018-10-13 ENCOUNTER — Ambulatory Visit: Payer: Medicare Other | Admitting: Cardiology
